# Patient Record
Sex: Female | Born: 1975
Health system: Southern US, Community
[De-identification: ages and names within clinical notes are randomized; demographics above are authoritative.]

## PROBLEM LIST (undated history)

## (undated) DIAGNOSIS — Z5189 Encounter for other specified aftercare: Secondary | ICD-10-CM

## (undated) DIAGNOSIS — G709 Myoneural disorder, unspecified: Secondary | ICD-10-CM

## (undated) DIAGNOSIS — Z86018 Personal history of other benign neoplasm: Secondary | ICD-10-CM

## (undated) DIAGNOSIS — F329 Major depressive disorder, single episode, unspecified: Secondary | ICD-10-CM

## (undated) DIAGNOSIS — H547 Unspecified visual loss: Secondary | ICD-10-CM

## (undated) DIAGNOSIS — Z8719 Personal history of other diseases of the digestive system: Secondary | ICD-10-CM

## (undated) DIAGNOSIS — G932 Benign intracranial hypertension: Secondary | ICD-10-CM

## (undated) DIAGNOSIS — F32A Depression, unspecified: Secondary | ICD-10-CM

## (undated) DIAGNOSIS — N63 Unspecified lump in unspecified breast: Secondary | ICD-10-CM

## (undated) DIAGNOSIS — D649 Anemia, unspecified: Secondary | ICD-10-CM

## (undated) HISTORY — PX: APPENDECTOMY: SHX54

## (undated) HISTORY — DX: Personal history of other benign neoplasm: Z86.018

## (undated) HISTORY — DX: Unspecified lump in unspecified breast: N63.0

## (undated) HISTORY — DX: Anemia, unspecified: D64.9

## (undated) HISTORY — PX: MYOMECTOMY: SHX85

## (undated) HISTORY — DX: Unspecified visual loss: H54.7

## (undated) HISTORY — PX: CHOLECYSTECTOMY: SHX55

## (undated) HISTORY — PX: BREAST EXCISIONAL BIOPSY: SUR124

---
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
## 1998-08-28 ENCOUNTER — Other Ambulatory Visit: Admission: RE | Admit: 1998-08-28 | Discharge: 1998-08-28 | Payer: Self-pay | Admitting: Obstetrics and Gynecology

## 1999-10-18 ENCOUNTER — Other Ambulatory Visit: Admission: RE | Admit: 1999-10-18 | Discharge: 1999-10-18 | Payer: Self-pay | Admitting: Obstetrics and Gynecology

## 2000-06-05 ENCOUNTER — Ambulatory Visit (HOSPITAL_COMMUNITY): Admission: RE | Admit: 2000-06-05 | Discharge: 2000-06-05 | Payer: Self-pay | Admitting: Neurosurgery

## 2000-06-05 ENCOUNTER — Encounter: Payer: Self-pay | Admitting: Neurosurgery

## 2001-03-02 ENCOUNTER — Other Ambulatory Visit: Admission: RE | Admit: 2001-03-02 | Discharge: 2001-03-02 | Payer: Self-pay | Admitting: Obstetrics and Gynecology

## 2001-08-25 ENCOUNTER — Encounter: Admission: RE | Admit: 2001-08-25 | Discharge: 2001-11-23 | Payer: Self-pay | Admitting: Emergency Medicine

## 2002-08-23 ENCOUNTER — Other Ambulatory Visit: Admission: RE | Admit: 2002-08-23 | Discharge: 2002-08-23 | Payer: Self-pay | Admitting: Obstetrics and Gynecology

## 2002-09-22 ENCOUNTER — Encounter: Admission: RE | Admit: 2002-09-22 | Discharge: 2002-12-21 | Payer: Self-pay | Admitting: Obstetrics and Gynecology

## 2004-07-06 ENCOUNTER — Other Ambulatory Visit: Admission: RE | Admit: 2004-07-06 | Discharge: 2004-07-06 | Payer: Self-pay | Admitting: Obstetrics and Gynecology

## 2004-11-29 ENCOUNTER — Other Ambulatory Visit: Admission: RE | Admit: 2004-11-29 | Discharge: 2004-11-29 | Payer: Self-pay | Admitting: Obstetrics and Gynecology

## 2005-01-03 ENCOUNTER — Encounter: Admission: RE | Admit: 2005-01-03 | Discharge: 2005-01-03 | Payer: Self-pay | Admitting: Emergency Medicine

## 2005-01-09 ENCOUNTER — Encounter: Admission: RE | Admit: 2005-01-09 | Discharge: 2005-01-09 | Payer: Self-pay | Admitting: Emergency Medicine

## 2005-01-13 ENCOUNTER — Ambulatory Visit (HOSPITAL_BASED_OUTPATIENT_CLINIC_OR_DEPARTMENT_OTHER): Admission: RE | Admit: 2005-01-13 | Discharge: 2005-01-13 | Payer: Self-pay | Admitting: Emergency Medicine

## 2005-01-20 ENCOUNTER — Ambulatory Visit: Payer: Self-pay | Admitting: Internal Medicine

## 2005-04-29 HISTORY — PX: OTHER SURGICAL HISTORY: SHX169

## 2005-06-10 ENCOUNTER — Other Ambulatory Visit: Admission: RE | Admit: 2005-06-10 | Discharge: 2005-06-10 | Payer: Self-pay | Admitting: Obstetrics and Gynecology

## 2006-08-14 ENCOUNTER — Ambulatory Visit: Payer: Self-pay | Admitting: Internal Medicine

## 2006-08-26 ENCOUNTER — Ambulatory Visit: Payer: Self-pay | Admitting: Internal Medicine

## 2006-08-26 LAB — CONVERTED CEMR LAB
ALT: 36 units/L (ref 0–40)
AST: 41 units/L — ABNORMAL HIGH (ref 0–37)
Albumin: 4 g/dL (ref 3.5–5.2)
Alkaline Phosphatase: 69 units/L (ref 39–117)
BUN: 11 mg/dL (ref 6–23)
Basophils Absolute: 0 10*3/uL (ref 0.0–0.1)
Basophils Relative: 0.2 % (ref 0.0–1.0)
Bilirubin, Direct: 0.2 mg/dL (ref 0.0–0.3)
CO2: 28 meq/L (ref 19–32)
Calcium, Total (PTH): 9.1 mg/dL (ref 8.4–10.5)
Calcium: 9 mg/dL (ref 8.4–10.5)
Chloride: 105 meq/L (ref 96–112)
Cholesterol: 124 mg/dL (ref 0–200)
Creatinine, Ser: 0.6 mg/dL (ref 0.4–1.2)
Eosinophils Absolute: 0.1 10*3/uL (ref 0.0–0.6)
Eosinophils Relative: 0.7 % (ref 0.0–5.0)
Ferritin: 7.9 ng/mL — ABNORMAL LOW (ref 10.0–291.0)
Folate: 17 ng/mL
GFR calc Af Amer: 151 mL/min
GFR calc non Af Amer: 125 mL/min
Glucose, Bld: 92 mg/dL (ref 70–99)
HCT: 31.1 % — ABNORMAL LOW (ref 36.0–46.0)
HDL: 52.1 mg/dL (ref >39.0)
Hemoglobin: 10.3 g/dL — ABNORMAL LOW (ref 12.0–15.0)
Hgb A1c MFr Bld: 3.8 % — ABNORMAL LOW (ref 4.6–6.0)
INR: 1.1 (ref 0.9–2.0)
Iron: 27 ug/dL — ABNORMAL LOW (ref 42–145)
LDL Cholesterol: 63 mg/dL (ref 0–99)
Lymphocytes Relative: 21.2 % (ref 12.0–46.0)
MCHC: 33.1 g/dL (ref 30.0–36.0)
MCV: 89.3 fL (ref 78.0–100.0)
Magnesium: 2.1 mg/dL (ref 1.5–2.5)
Monocytes Absolute: 0.5 10*3/uL (ref 0.2–0.7)
Monocytes Relative: 6 % (ref 3.0–11.0)
Neutro Abs: 5.5 10*3/uL (ref 1.4–7.7)
Neutrophils Relative %: 71.9 % (ref 43.0–77.0)
PTH: 86.9 pg/mL — ABNORMAL HIGH (ref 14.0–72.0)
Platelets: 220 10*3/uL (ref 150–400)
Potassium: 3.8 meq/L (ref 3.5–5.1)
Prothrombin Time: 13 s (ref 10.0–14.0)
RBC: 3.48 M/uL — ABNORMAL LOW (ref 3.87–5.11)
RDW: 15.4 % — ABNORMAL HIGH (ref 11.5–14.6)
Saturation Ratios: 7 % — ABNORMAL LOW (ref 20.0–50.0)
Sodium: 138 meq/L (ref 135–145)
T3 Uptake Ratio: 37.7 % — ABNORMAL HIGH (ref 22.5–37.0)
T4, Total: 6.6 ug/dL (ref 5.0–12.5)
TSH: 0.64 microintl units/mL (ref 0.35–5.50)
Total Bilirubin: 0.7 mg/dL (ref 0.3–1.2)
Total CHOL/HDL Ratio: 2.4
Total Protein: 7.3 g/dL (ref 6.0–8.3)
Transferrin: 275.9 mg/dL (ref >=212.0)
Triglycerides: 43 mg/dL (ref 0–149)
VLDL: 9 mg/dL (ref 0–40)
Vit D, 1,25-Dihydroxy: <7 — ABNORMAL LOW (ref 20–57)
Vitamin B-12: 505 pg/mL (ref 211–911)
WBC: 7.7 10*3/uL (ref 4.5–10.5)

## 2006-10-16 ENCOUNTER — Ambulatory Visit: Payer: Self-pay | Admitting: Internal Medicine

## 2006-10-16 LAB — CONVERTED CEMR LAB
ALT: 11 units/L (ref 0–40)
AST: 15 units/L (ref 0–37)
Albumin: 2.7 g/dL — ABNORMAL LOW (ref 3.5–5.2)
Alkaline Phosphatase: 67 units/L (ref 39–117)
BUN: 11 mg/dL (ref 6–23)
Bacteria, UA: NEGATIVE
Basophils Absolute: 0 10*3/uL (ref 0.0–0.1)
Basophils Relative: 0.3 % (ref 0.0–1.0)
Bilirubin, Direct: 0.1 mg/dL (ref 0.0–0.3)
CO2: 29 meq/L (ref 19–32)
Calcium: 8.2 mg/dL — ABNORMAL LOW (ref 8.4–10.5)
Chloride: 107 meq/L (ref 96–112)
Creatinine, Ser: 0.7 mg/dL (ref 0.4–1.2)
Crystals: NEGATIVE
Eosinophils Absolute: 0.1 10*3/uL (ref 0.0–0.6)
Eosinophils Relative: 0.9 % (ref 0.0–5.0)
GFR calc Af Amer: 126 mL/min
GFR calc non Af Amer: 104 mL/min
Glucose, Bld: 85 mg/dL (ref 70–99)
HCT: 28.1 % — ABNORMAL LOW (ref 36.0–46.0)
Hemoglobin: 9.1 g/dL — ABNORMAL LOW (ref 12.0–15.0)
Lymphocytes Relative: 14.6 % (ref 12.0–46.0)
MCHC: 32.3 g/dL (ref 30.0–36.0)
MCV: 83.2 fL (ref 78.0–100.0)
Monocytes Absolute: 1.1 10*3/uL — ABNORMAL HIGH (ref 0.2–0.7)
Monocytes Relative: 10.7 % (ref 3.0–11.0)
Neutro Abs: 7.5 10*3/uL (ref 1.4–7.7)
Neutrophils Relative %: 73.5 % (ref 43.0–77.0)
Nitrite: NEGATIVE
Platelets: 463 10*3/uL — ABNORMAL HIGH (ref 150–400)
Potassium: 4.5 meq/L (ref 3.5–5.1)
RBC: 3.38 M/uL — ABNORMAL LOW (ref 3.87–5.11)
RDW: 14.9 % — ABNORMAL HIGH (ref 11.5–14.6)
Sodium: 137 meq/L (ref 135–145)
Specific Gravity, Urine: 1.025 (ref 1.000–1.03)
Total Bilirubin: 0.4 mg/dL (ref 0.3–1.2)
Total Protein, Urine: 30 mg/dL — AB
Total Protein: 8.5 g/dL — ABNORMAL HIGH (ref 6.0–8.3)
Urine Glucose: NEGATIVE mg/dL
Urobilinogen, UA: 1 (ref 0.0–1.0)
WBC: 10.2 10*3/uL (ref 4.5–10.5)
hCG, Beta Chain, Quant, S: <0.5 milliintl units/mL
pH: 6 (ref 5.0–8.0)

## 2007-01-09 ENCOUNTER — Ambulatory Visit: Payer: Self-pay | Admitting: Internal Medicine

## 2007-01-09 LAB — CONVERTED CEMR LAB
ALT: 27 units/L (ref 0–35)
AST: 35 units/L (ref 0–37)
Albumin: 3.2 g/dL — ABNORMAL LOW (ref 3.5–5.2)
Alkaline Phosphatase: 67 units/L (ref 39–117)
BUN: 11 mg/dL (ref 6–23)
Basophils Absolute: 0 10*3/uL (ref 0.0–0.1)
Basophils Relative: 0.5 % (ref 0.0–1.0)
Bilirubin, Direct: 0.1 mg/dL (ref 0.0–0.3)
CO2: 26 meq/L (ref 19–32)
Calcium: 9 mg/dL (ref 8.4–10.5)
Chloride: 112 meq/L (ref 96–112)
Creatinine, Ser: 0.9 mg/dL (ref 0.4–1.2)
Eosinophils Absolute: 0.1 10*3/uL (ref 0.0–0.6)
Eosinophils Relative: 0.7 % (ref 0.0–5.0)
Folate: 12 ng/mL
GFR calc Af Amer: 95 mL/min
GFR calc non Af Amer: 78 mL/min
Glucose, Bld: 84 mg/dL (ref 70–99)
HCT: 29.5 % — ABNORMAL LOW (ref 36.0–46.0)
Hemoglobin: 9.2 g/dL — ABNORMAL LOW (ref 12.0–15.0)
Iron: 26 ug/dL — ABNORMAL LOW (ref 42–145)
Lymphocytes Relative: 36.2 % (ref 12.0–46.0)
MCHC: 31.2 g/dL (ref 30.0–36.0)
MCV: 80.2 fL (ref 78.0–100.0)
Monocytes Absolute: 0.5 10*3/uL (ref 0.2–0.7)
Monocytes Relative: 7.5 % (ref 3.0–11.0)
Neutro Abs: 4 10*3/uL (ref 1.4–7.7)
Neutrophils Relative %: 55.1 % (ref 43.0–77.0)
Platelets: 219 10*3/uL (ref 150–400)
Potassium: 3.9 meq/L (ref 3.5–5.1)
RBC: 3.67 M/uL — ABNORMAL LOW (ref 3.87–5.11)
RDW: 16.1 % — ABNORMAL HIGH (ref 11.5–14.6)
Saturation Ratios: 6.5 % — ABNORMAL LOW (ref 20.0–50.0)
Sodium: 142 meq/L (ref 135–145)
Total Bilirubin: 0.5 mg/dL (ref 0.3–1.2)
Total Protein: 9.3 g/dL — ABNORMAL HIGH (ref 6.0–8.3)
Transferrin: 287.1 mg/dL (ref >=212.0)
WBC: 7.2 10*3/uL (ref 4.5–10.5)

## 2007-01-14 ENCOUNTER — Ambulatory Visit: Payer: Self-pay | Admitting: Internal Medicine

## 2007-01-19 ENCOUNTER — Ambulatory Visit: Payer: Self-pay | Admitting: Gastroenterology

## 2007-01-19 LAB — CONVERTED CEMR LAB
CA 125: 27.2 units/mL (ref 0.0–30.2)
CEA: 1.9 ng/mL (ref 0.0–5.0)
INR: 1.3 — ABNORMAL HIGH (ref 0.8–1.0)
Prothrombin Time: 14.1 s — ABNORMAL HIGH (ref 10.9–13.3)
aPTT: 34.8 s — ABNORMAL HIGH (ref 21.7–29.8)

## 2007-01-22 ENCOUNTER — Ambulatory Visit (HOSPITAL_COMMUNITY): Admission: RE | Admit: 2007-01-22 | Discharge: 2007-01-22 | Payer: Self-pay | Admitting: Gastroenterology

## 2007-01-22 ENCOUNTER — Encounter (INDEPENDENT_AMBULATORY_CARE_PROVIDER_SITE_OTHER): Payer: Self-pay | Admitting: Interventional Radiology

## 2007-02-02 ENCOUNTER — Ambulatory Visit: Payer: Self-pay | Admitting: Gastroenterology

## 2007-02-04 ENCOUNTER — Ambulatory Visit (HOSPITAL_COMMUNITY): Admission: RE | Admit: 2007-02-04 | Discharge: 2007-02-04 | Payer: Self-pay | Admitting: Gastroenterology

## 2007-02-10 ENCOUNTER — Ambulatory Visit: Payer: Self-pay | Admitting: Internal Medicine

## 2007-02-18 ENCOUNTER — Ambulatory Visit (HOSPITAL_COMMUNITY): Admission: RE | Admit: 2007-02-18 | Discharge: 2007-02-18 | Payer: Self-pay | Admitting: Internal Medicine

## 2007-03-05 ENCOUNTER — Encounter: Payer: Self-pay | Admitting: Internal Medicine

## 2007-03-06 ENCOUNTER — Encounter: Payer: Self-pay | Admitting: Internal Medicine

## 2007-03-12 ENCOUNTER — Encounter: Payer: Self-pay | Admitting: Internal Medicine

## 2007-03-30 ENCOUNTER — Encounter: Payer: Self-pay | Admitting: Internal Medicine

## 2007-03-31 DIAGNOSIS — F329 Major depressive disorder, single episode, unspecified: Secondary | ICD-10-CM | POA: Insufficient documentation

## 2007-03-31 DIAGNOSIS — F3289 Other specified depressive episodes: Secondary | ICD-10-CM | POA: Insufficient documentation

## 2007-05-01 ENCOUNTER — Encounter: Payer: Self-pay | Admitting: Internal Medicine

## 2007-05-06 LAB — CONVERTED CEMR LAB

## 2007-05-11 ENCOUNTER — Encounter: Payer: Self-pay | Admitting: Internal Medicine

## 2007-06-16 ENCOUNTER — Ambulatory Visit: Payer: Self-pay | Admitting: Internal Medicine

## 2007-06-16 DIAGNOSIS — Z9884 Bariatric surgery status: Secondary | ICD-10-CM | POA: Insufficient documentation

## 2007-06-16 DIAGNOSIS — H6980 Other specified disorders of Eustachian tube, unspecified ear: Secondary | ICD-10-CM | POA: Insufficient documentation

## 2007-06-18 ENCOUNTER — Encounter: Payer: Self-pay | Admitting: Internal Medicine

## 2007-07-07 DIAGNOSIS — K439 Ventral hernia without obstruction or gangrene: Secondary | ICD-10-CM | POA: Insufficient documentation

## 2007-07-07 DIAGNOSIS — Z87898 Personal history of other specified conditions: Secondary | ICD-10-CM | POA: Insufficient documentation

## 2007-07-07 DIAGNOSIS — H548 Legal blindness, as defined in USA: Secondary | ICD-10-CM | POA: Insufficient documentation

## 2007-07-07 DIAGNOSIS — G932 Benign intracranial hypertension: Secondary | ICD-10-CM | POA: Insufficient documentation

## 2007-07-07 DIAGNOSIS — E119 Type 2 diabetes mellitus without complications: Secondary | ICD-10-CM | POA: Insufficient documentation

## 2007-07-07 DIAGNOSIS — Z8669 Personal history of other diseases of the nervous system and sense organs: Secondary | ICD-10-CM | POA: Insufficient documentation

## 2007-07-07 DIAGNOSIS — R188 Other ascites: Secondary | ICD-10-CM | POA: Insufficient documentation

## 2007-07-16 ENCOUNTER — Ambulatory Visit: Payer: Self-pay | Admitting: Internal Medicine

## 2007-07-16 DIAGNOSIS — R51 Headache: Secondary | ICD-10-CM | POA: Insufficient documentation

## 2007-07-16 DIAGNOSIS — R519 Headache, unspecified: Secondary | ICD-10-CM | POA: Insufficient documentation

## 2007-07-21 ENCOUNTER — Ambulatory Visit: Payer: Self-pay | Admitting: Cardiovascular Disease

## 2007-09-24 ENCOUNTER — Inpatient Hospital Stay (HOSPITAL_COMMUNITY): Admission: AD | Admit: 2007-09-24 | Discharge: 2007-09-24 | Payer: Self-pay | Admitting: Obstetrics and Gynecology

## 2007-10-01 ENCOUNTER — Ambulatory Visit: Payer: Self-pay | Admitting: Internal Medicine

## 2007-10-01 ENCOUNTER — Encounter: Payer: Self-pay | Admitting: Internal Medicine

## 2007-10-01 LAB — CONVERTED CEMR LAB
Calcium, Total (PTH): 9 mg/dL (ref 8.4–10.5)
INR: 1.2 — ABNORMAL HIGH (ref 0.8–1.0)
PTH: 60.6 pg/mL (ref 14.0–72.0)
Prothrombin Time: 13.9 s — ABNORMAL HIGH (ref 10.9–13.3)
Vit D, 1,25-Dihydroxy: 9 — ABNORMAL LOW (ref 30–89)

## 2007-10-02 ENCOUNTER — Encounter: Payer: Self-pay | Admitting: Internal Medicine

## 2008-02-25 ENCOUNTER — Encounter: Admission: RE | Admit: 2008-02-25 | Discharge: 2008-02-25 | Payer: Self-pay | Admitting: Obstetrics and Gynecology

## 2008-04-28 IMAGING — US US PARACENTESIS
1 series · 4 of 4 positions shown · non-contrast
Comparison: none

CLINICAL DATA: 31-year-old with history of pseudotumor cerebri with a lumbar peritoneal shunt.  Patient has recurrent ascites.  
 ULTRASOUND-GUIDED THERAPEUTIC PARACENTESIS:
 An ultrasound-guided paracentesis was thoroughly discussed with the patient and questions answered. The benefits, risks, alternatives, and complications were also discussed. The patient understands and wishes to proceed with the procedure. A verbal as well as written consent was obtained.
 Ultrasound was performed to localize and mark an adequate pocket of fluid in the right abdomen. The area was then prepped and draped in the normal sterile fashion. 1% Lidocaine was used for local anesthesia. A 19-gauge Yueh catheter was introduced yielding approximately 7.2 liters of clear yellow fluid. The patient tolerated the procedure well, and there were no immediate complications.

[Series 1: paracentesis · 0.37mm/px · 4 of 4 slices shown]
[im 1/4]
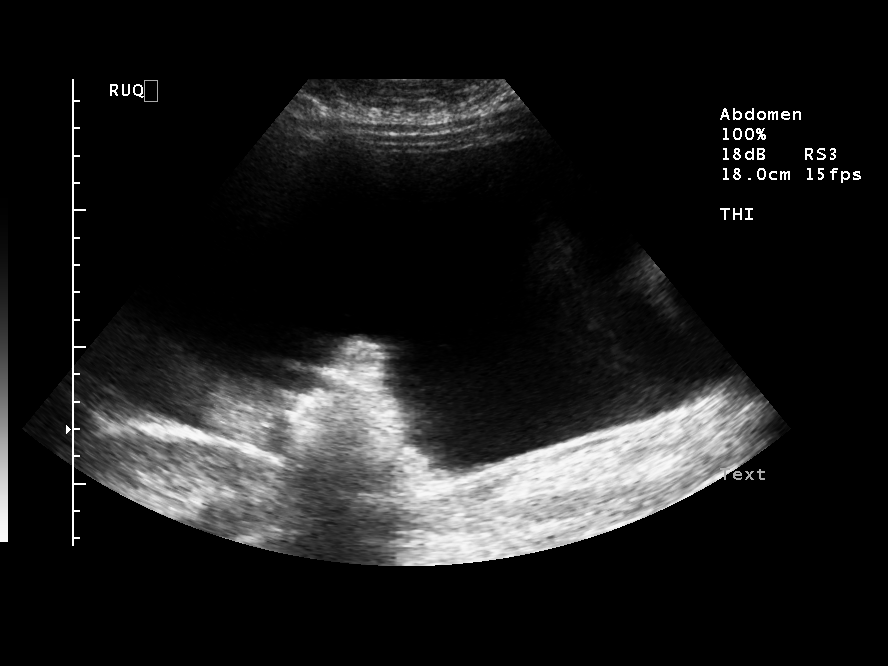
[im 2/4]
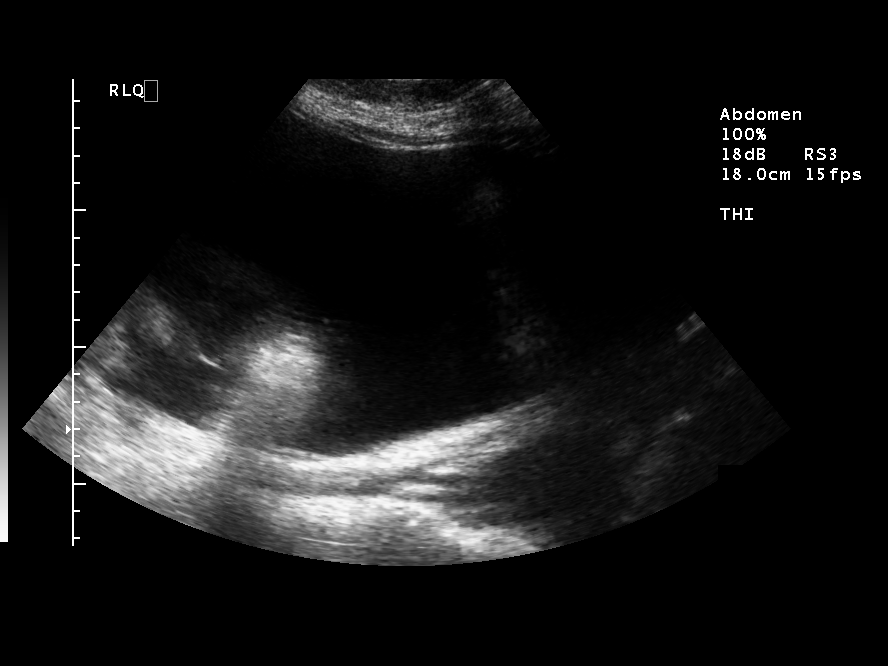
[im 3/4]
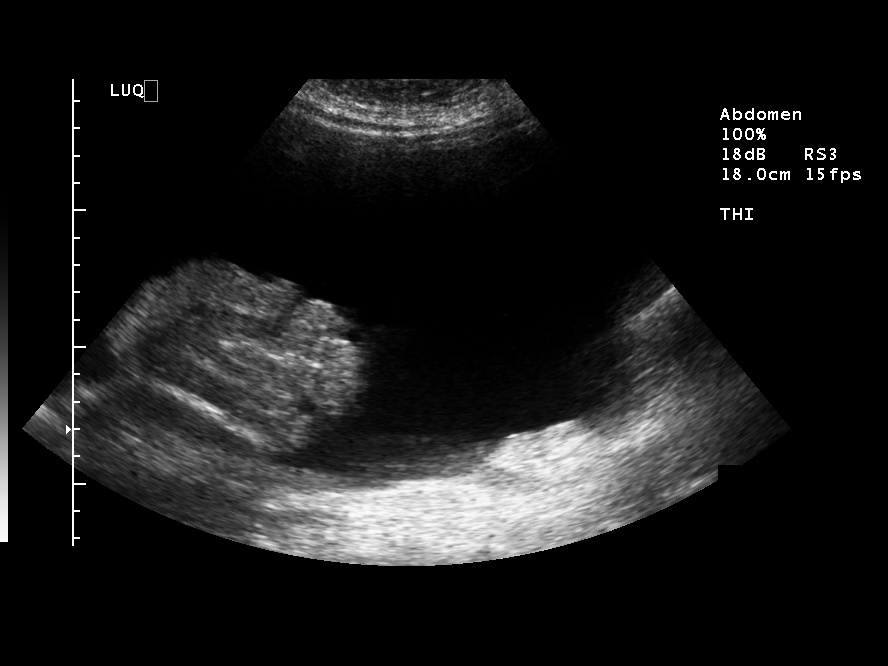
[im 4/4]
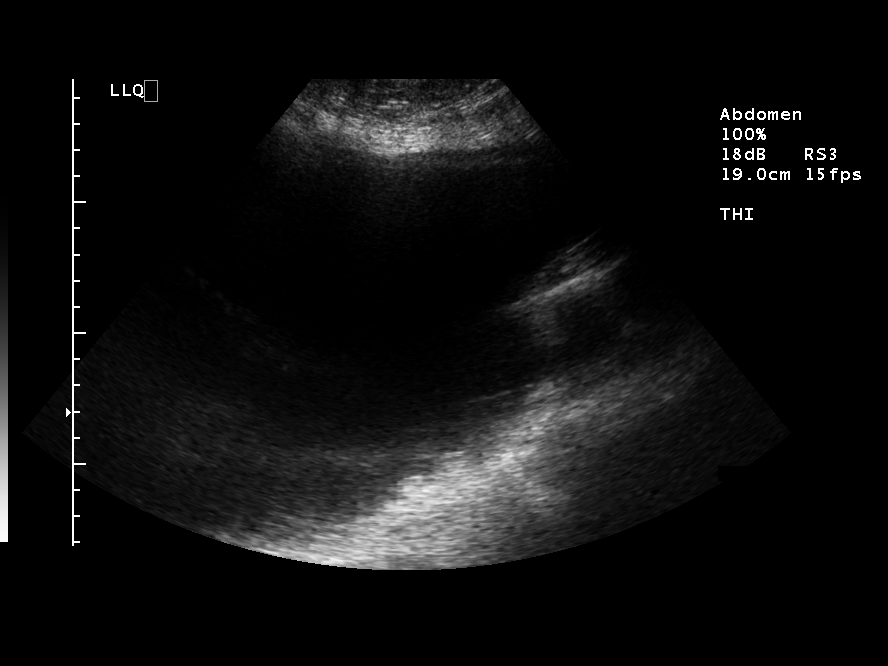

[4 of 4 positions shown; findings below may reference images not displayed]

IMPRESSION: Successful ultrasound-guided paracentesis yielding 7.2 liters of clear yellow fluid.

## 2008-06-14 ENCOUNTER — Ambulatory Visit: Payer: Self-pay | Admitting: Hematology and Oncology

## 2008-06-17 LAB — CBC WITH DIFFERENTIAL/PLATELET
BASO%: 0.5 % (ref 0.0–2.0)
Basophils Absolute: 0 10*3/uL (ref 0.0–0.1)
EOS%: 2.5 % (ref 0.0–7.0)
Eosinophils Absolute: 0.2 10*3/uL (ref 0.0–0.5)
HCT: 30 % — ABNORMAL LOW (ref 34.8–46.6)
HGB: 8.8 g/dL — ABNORMAL LOW (ref 11.6–15.9)
LYMPH%: 18.8 % (ref 14.0–49.7)
MCH: 22.5 pg — ABNORMAL LOW (ref 25.1–34.0)
MCHC: 29.3 g/dL — ABNORMAL LOW (ref 31.5–36.0)
MCV: 76.7 fL — ABNORMAL LOW (ref 79.5–101.0)
MONO#: 0.6 10*3/uL (ref 0.1–0.9)
MONO%: 7.6 % (ref 0.0–14.0)
NEUT#: 5.7 10*3/uL (ref 1.5–6.5)
NEUT%: 70.6 % (ref 38.4–76.8)
Platelets: 272 10*3/uL (ref 145–400)
RBC: 3.91 10*6/uL (ref 3.70–5.45)
RDW: 19.1 % — ABNORMAL HIGH (ref 11.2–14.5)
WBC: 8.1 10*3/uL (ref 3.9–10.3)
lymph#: 1.5 10*3/uL (ref 0.9–3.3)

## 2008-06-21 LAB — VITAMIN B12: Vitamin B-12: 811 pg/mL (ref 211–911)

## 2008-06-21 LAB — COMPREHENSIVE METABOLIC PANEL
ALT: 14 U/L (ref 0–35)
AST: 18 U/L (ref 0–37)
Albumin: 4 g/dL (ref 3.5–5.2)
Alkaline Phosphatase: 75 U/L (ref 39–117)
BUN: 12 mg/dL (ref 6–23)
CO2: 25 mEq/L (ref 19–32)
Calcium: 8.8 mg/dL (ref 8.4–10.5)
Chloride: 107 mEq/L (ref 96–112)
Creatinine, Ser: 0.64 mg/dL (ref 0.40–1.20)
Glucose, Bld: 82 mg/dL (ref 70–99)
Potassium: 4.3 mEq/L (ref 3.5–5.3)
Sodium: 137 mEq/L (ref 135–145)
Total Bilirubin: 0.3 mg/dL (ref 0.3–1.2)
Total Protein: 7.5 g/dL (ref 6.0–8.3)

## 2008-06-21 LAB — IRON AND TIBC
Iron: <10 ug/dL — ABNORMAL LOW (ref 42–145)
UIBC: 407 ug/dL

## 2008-06-21 LAB — PROTEIN ELECTROPHORESIS, SERUM, WITH REFLEX
Albumin ELP: 51.5 % — ABNORMAL LOW (ref 55.8–66.1)
Alpha-1-Globulin: 4.2 % (ref 2.9–4.9)
Alpha-2-Globulin: 8.6 % (ref 7.1–11.8)
Beta 2: 3.1 % — ABNORMAL LOW (ref 3.2–6.5)
Beta Globulin: 6.5 % (ref 4.7–7.2)
Gamma Globulin: 26.1 % — ABNORMAL HIGH (ref 11.1–18.8)
Total Protein, Serum Electrophoresis: 7.5 g/dL (ref 6.0–8.3)

## 2008-06-21 LAB — HAPTOGLOBIN: Haptoglobin: 85 mg/dL (ref 16–200)

## 2008-06-21 LAB — FERRITIN: Ferritin: 4 ng/mL — ABNORMAL LOW (ref 10–291)

## 2008-06-21 LAB — DIRECT ANTIGLOBULIN TEST (NOT AT ARMC)
DAT (Complement): NEGATIVE
DAT IgG: NEGATIVE

## 2008-06-21 LAB — ERYTHROPOIETIN: Erythropoietin: 118 m[IU]/mL — ABNORMAL HIGH (ref 2.6–34.0)

## 2008-07-26 ENCOUNTER — Ambulatory Visit: Payer: Self-pay | Admitting: Hematology and Oncology

## 2008-08-02 LAB — CBC WITH DIFFERENTIAL/PLATELET
BASO%: 0.5 % (ref 0.0–2.0)
Basophils Absolute: 0 10*3/uL (ref 0.0–0.1)
EOS%: 0.9 % (ref 0.0–7.0)
Eosinophils Absolute: 0.1 10*3/uL (ref 0.0–0.5)
HCT: 35.5 % (ref 34.8–46.6)
HGB: 11.9 g/dL (ref 11.6–15.9)
LYMPH%: 12.8 % — ABNORMAL LOW (ref 14.0–49.7)
MCH: 29.4 pg (ref 25.1–34.0)
MCHC: 33.5 g/dL (ref 31.5–36.0)
MCV: 88 fL (ref 79.5–101.0)
MONO#: 0.5 10*3/uL (ref 0.1–0.9)
MONO%: 5 % (ref 0.0–14.0)
NEUT#: 8.6 10*3/uL — ABNORMAL HIGH (ref 1.5–6.5)
NEUT%: 80.8 % — ABNORMAL HIGH (ref 38.4–76.8)
Platelets: 153 10*3/uL (ref 145–400)
RBC: 4.03 10*6/uL (ref 3.70–5.45)
RDW: 25.8 % — ABNORMAL HIGH (ref 11.2–14.5)
WBC: 10.6 10*3/uL — ABNORMAL HIGH (ref 3.9–10.3)
lymph#: 1.4 10*3/uL (ref 0.9–3.3)

## 2008-08-02 LAB — IRON AND TIBC
%SAT: 19 % — ABNORMAL LOW (ref 20–55)
Iron: 52 ug/dL (ref 42–145)
TIBC: 277 ug/dL (ref 250–470)
UIBC: 225 ug/dL

## 2008-08-02 LAB — FERRITIN: Ferritin: 118 ng/mL (ref 10–291)

## 2008-12-07 ENCOUNTER — Encounter: Admission: RE | Admit: 2008-12-07 | Discharge: 2008-12-07 | Payer: Self-pay | Admitting: Obstetrics and Gynecology

## 2010-02-09 ENCOUNTER — Emergency Department (HOSPITAL_COMMUNITY): Admission: EM | Admit: 2010-02-09 | Discharge: 2010-02-09 | Payer: Self-pay | Admitting: Emergency Medicine

## 2010-02-15 IMAGING — MG MM DIAGNOSTIC UNILATERAL R
3 series · 3 of 3 positions shown · non-contrast
Comparison: 02/25/2008

CLINICAL DATA: The patient notes possible right breast mass.

DIGITAL DIAGNOSTIC  RIGHT BREAST  MAMMOGRAM  WITH CAD AND RIGHT
BREAST ULTRASOUND:

[R CC]
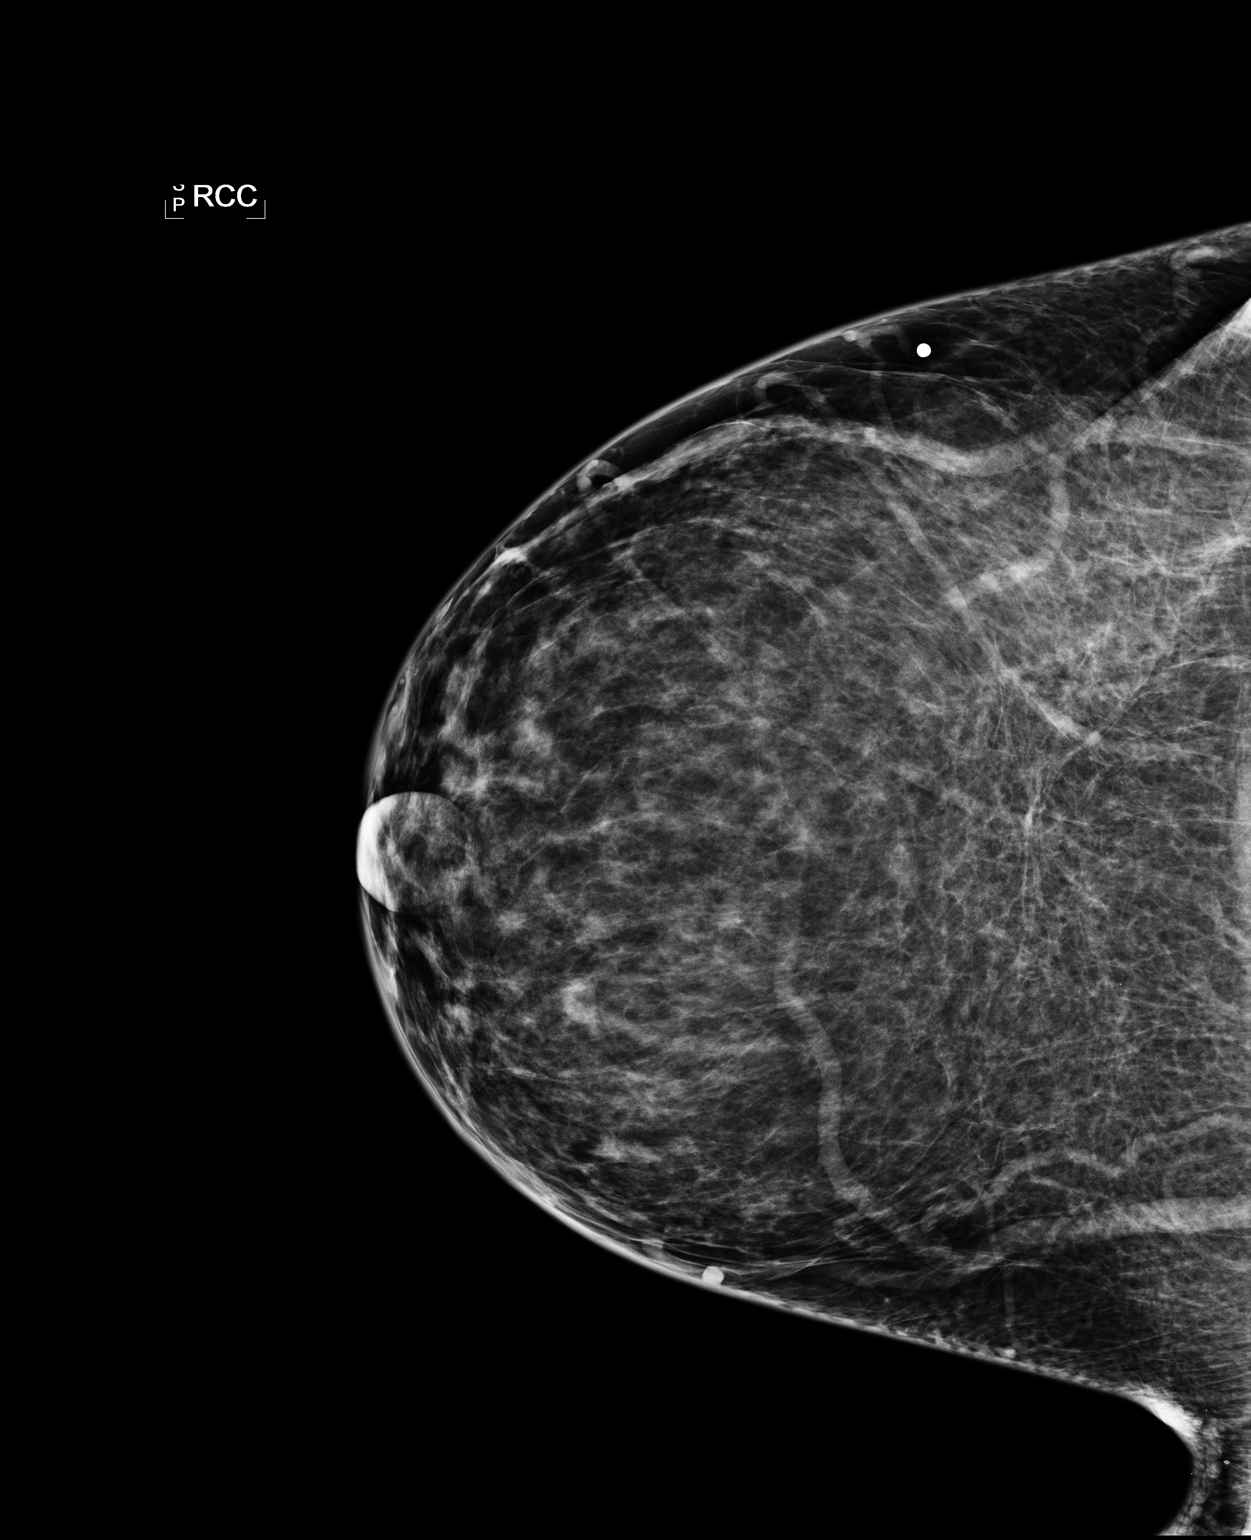

[R MLO]
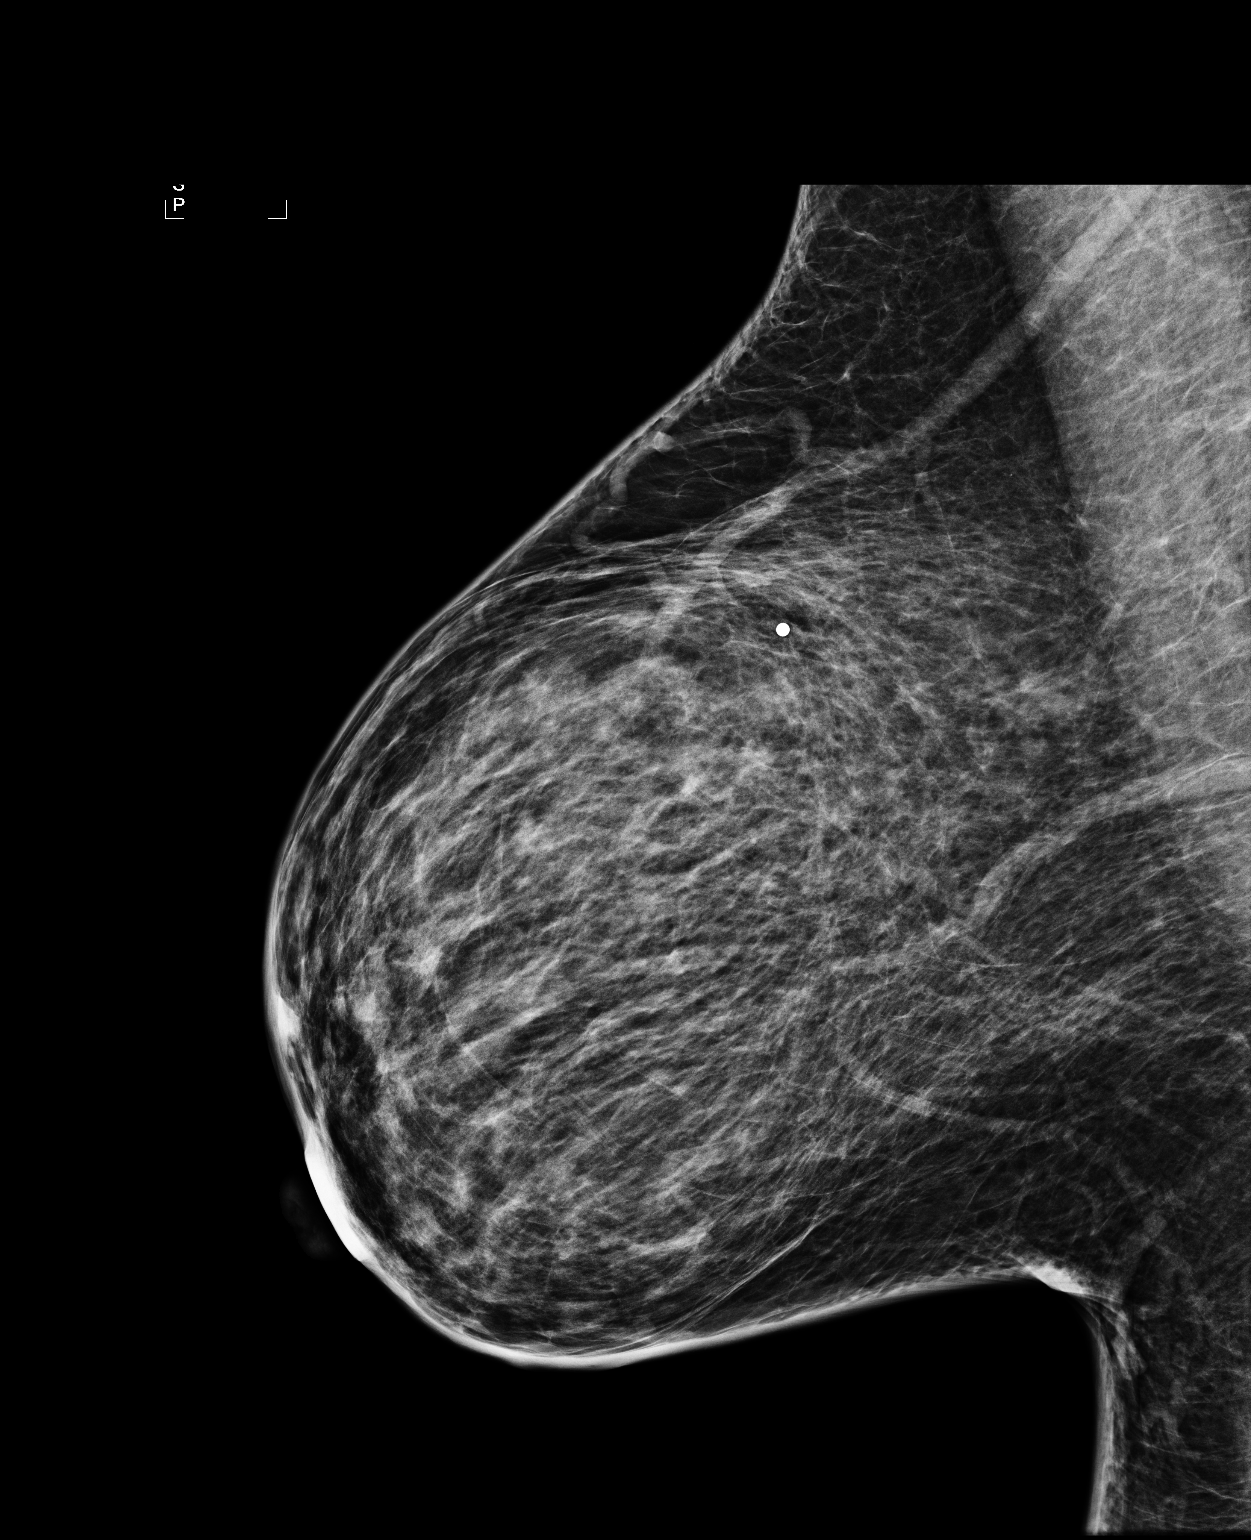

[R TAN]
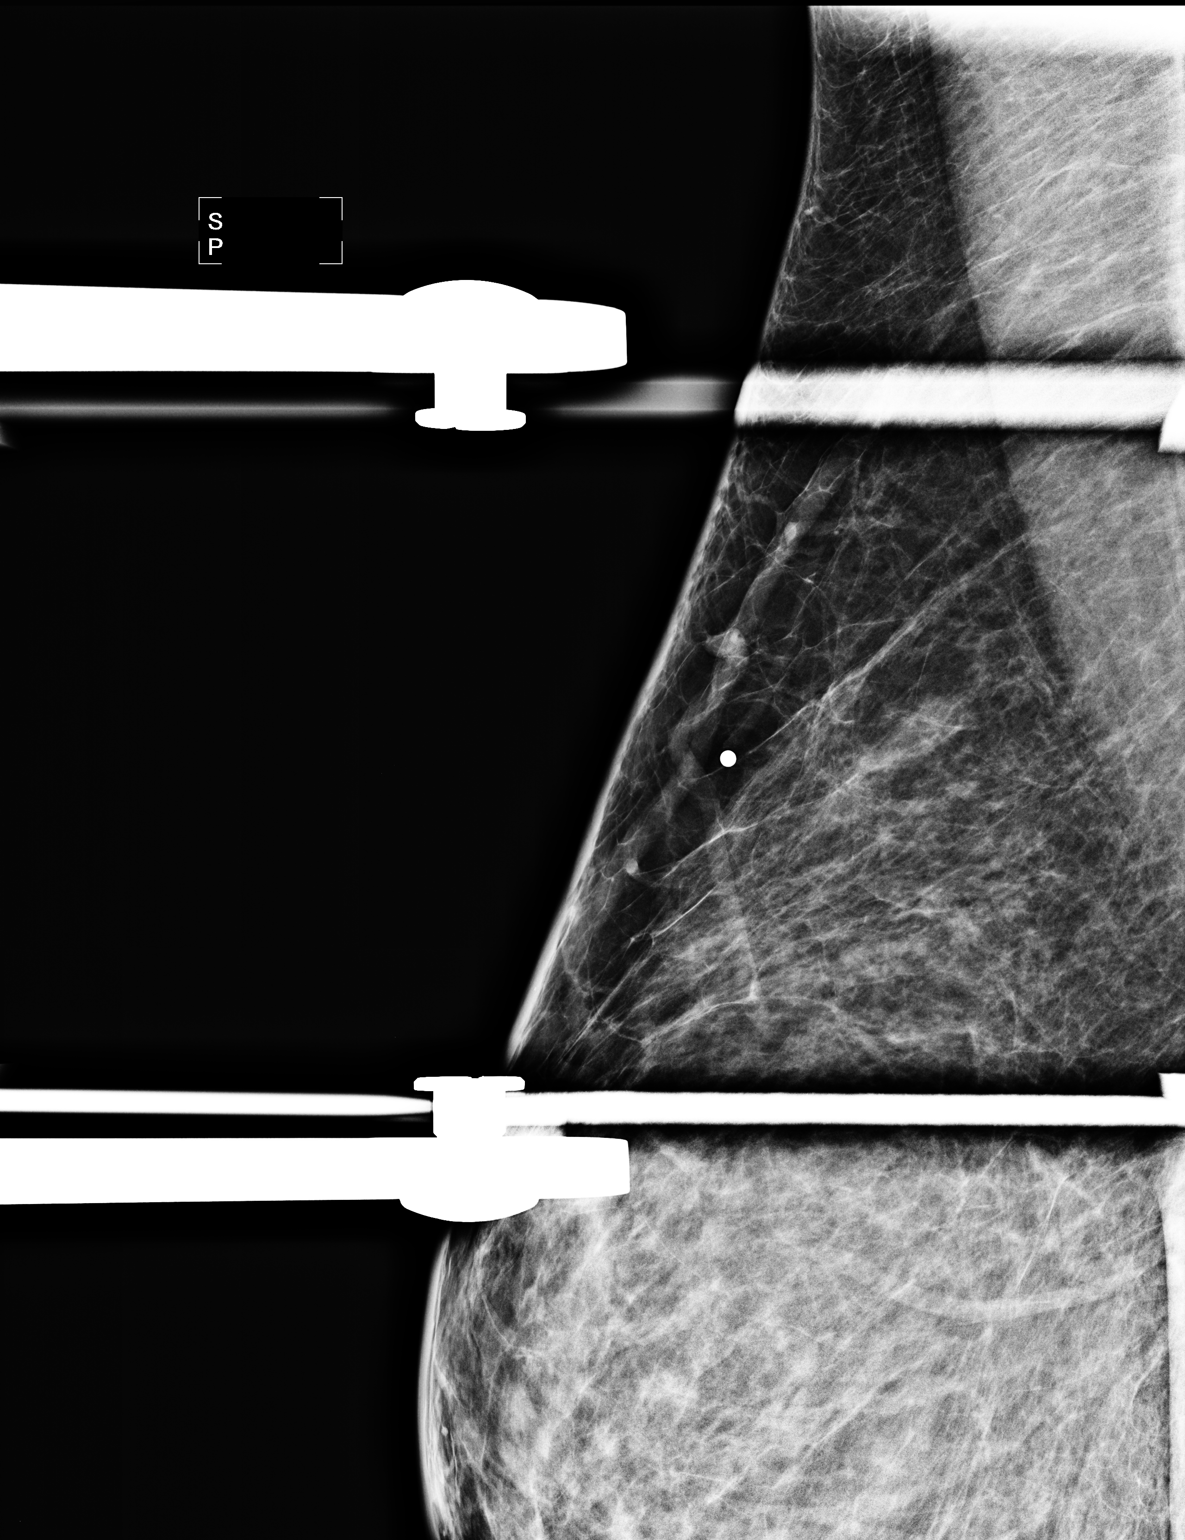

[3 of 3 positions shown; findings below may reference images not displayed]

FINDINGS: There is a scattered fibroglandular breast parenchymal
pattern present which is stable.  There is no mass, distortion, or
worrisome calcification.
Mammographic images were processed with CAD.

On physical exam, there is thickening within the lateral right
breast.  However, there is no discrete palpable mass.

Ultrasound is performed, showing normal-appearing fibroglandular
tissue and fatty tissue within the lateral right breast.  There is
no mass, cyst, distortion, or worrisome shadowing
IMPRESSION: Thickening located laterally within the right breast is consistent
with glandular tissue and fatty tissue.  There is no evidence for
developing malignancy.  Recommend screening mammography at age 41
(or earlier if felt to be clinically indicated).

BI-RADS CATEGORY 1:  Negative.

## 2010-04-29 DIAGNOSIS — IMO0001 Reserved for inherently not codable concepts without codable children: Secondary | ICD-10-CM

## 2010-04-29 DIAGNOSIS — Z5189 Encounter for other specified aftercare: Secondary | ICD-10-CM

## 2010-04-29 HISTORY — DX: Reserved for inherently not codable concepts without codable children: IMO0001

## 2010-04-29 HISTORY — PX: BREAST SURGERY: SHX581

## 2010-04-29 HISTORY — DX: Encounter for other specified aftercare: Z51.89

## 2010-05-07 ENCOUNTER — Encounter
Admission: RE | Admit: 2010-05-07 | Discharge: 2010-05-07 | Payer: Self-pay | Source: Home / Self Care | Attending: Obstetrics and Gynecology | Admitting: Obstetrics and Gynecology

## 2010-05-19 ENCOUNTER — Encounter: Payer: Self-pay | Admitting: Obstetrics and Gynecology

## 2010-05-27 HISTORY — PX: FETAL BLOOD TRANSFUSION: SHX1602

## 2010-05-27 LAB — CONVERTED CEMR LAB
ALT: 23 units/L (ref 0–35)
AST: 25 units/L (ref 0–37)
Albumin: 3.6 g/dL (ref 3.5–5.2)
Alkaline Phosphatase: 74 units/L (ref 39–117)
BUN: 13 mg/dL (ref 6–23)
Basophils Absolute: 0 10*3/uL (ref 0.0–0.1)
Basophils Relative: 0.5 % (ref 0.0–1.0)
Bilirubin, Direct: 0.1 mg/dL (ref 0.0–0.3)
CO2: 23 meq/L (ref 19–32)
Calcium: 8.5 mg/dL (ref 8.4–10.5)
Chloride: 107 meq/L (ref 96–112)
Cholesterol: 115 mg/dL (ref 0–200)
Creatinine, Ser: 0.7 mg/dL (ref 0.4–1.2)
Eosinophils Absolute: 0.2 10*3/uL (ref 0.0–0.7)
Eosinophils Relative: 2.8 % (ref 0.0–5.0)
Ferritin: 6.2 ng/mL — ABNORMAL LOW (ref 10.0–291.0)
Folate: 13 ng/mL
GFR calc Af Amer: 126 mL/min
GFR calc non Af Amer: 104 mL/min
Glucose, Bld: 86 mg/dL (ref 70–99)
HCT: 25.1 % — ABNORMAL LOW (ref 36.0–46.0)
HDL: 50.8 mg/dL (ref >39.0)
Hemoglobin: 8.4 g/dL — ABNORMAL LOW (ref 12.0–15.0)
LDL Cholesterol: 59 mg/dL (ref 0–99)
Lymphocytes Relative: 25.6 % (ref 12.0–46.0)
MCHC: 33.3 g/dL (ref 30.0–36.0)
MCV: 76.4 fL — ABNORMAL LOW (ref 78.0–100.0)
Magnesium: 2.3 mg/dL (ref 1.5–2.5)
Monocytes Absolute: 0.6 10*3/uL (ref 0.1–1.0)
Monocytes Relative: 7.9 % (ref 3.0–12.0)
Neutro Abs: 5.2 10*3/uL (ref 1.4–7.7)
Neutrophils Relative %: 63.2 % (ref 43.0–77.0)
Platelets: 381 10*3/uL (ref 150–400)
Potassium: 4 meq/L (ref 3.5–5.1)
RBC: 3.28 M/uL — ABNORMAL LOW (ref 3.87–5.11)
RDW: 19.7 % — ABNORMAL HIGH (ref 11.5–14.6)
Sodium: 134 meq/L — ABNORMAL LOW (ref 135–145)
T3 Uptake Ratio: 44.9 % — ABNORMAL HIGH (ref 22.5–37.0)
T4, Total: 5.9 ug/dL (ref 5.0–12.5)
TSH: 0.66 microintl units/mL (ref 0.35–5.50)
Total Bilirubin: 0.6 mg/dL (ref 0.3–1.2)
Total CHOL/HDL Ratio: 2.3
Total Protein: 7.9 g/dL (ref 6.0–8.3)
Triglycerides: 26 mg/dL (ref 0–149)
VLDL: 5 mg/dL (ref 0–40)
Vitamin B-12: 483 pg/mL (ref 211–911)
WBC: 8.1 10*3/uL (ref 4.5–10.5)

## 2010-05-29 NOTE — Assessment & Plan Note (Signed)
Summary: FU-$50-CO PAY INFO-STC   Vital Signs:  Patient Profile:   35 Years Old Female Height:     69 inches Weight:      183.25 pounds BMI:     27.16 Temp:     96.9 degrees F oral Pulse rate:   70 / minute BP sitting:   109 / 68  (right arm)  Vitals Entered By: Glendell Docker (June 16, 2007 9:28 AM)                 Chief Complaint:  C/O RIGHT EAR DISCOMFORT  and Ear pain.  History of Present Illness:  Ear Pain      This is a 35 year old woman who presents with Ear pain.  The patient denies ear discharge, sensation of fullness, and tinnitus.  The pain is located in the right ear.  The pain is described as intermittent.  The patient denies vertigo.  She describes "swooshing" noise which is worse in AM.  Her symptoms more noticeable after shunt removed at [facility].  She denies headache.    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  Mother has    a history of hypertension, anxiety/depression, hyperlipidemia.  Father    is 48 and has type 2 diabetes, hypertension and hyperlipidemia.  She has    three brothers who are healthy.  She did not report any family history    of cancer.   Social History:    Single    Never Smoked    Alcohol use-no    She is originally from [facility]   Risk Factors:  Tobacco use:  never Alcohol use:  no   Review of Systems      See HPI   Physical Exam  General:     alert, well-developed, and well-nourished.   Neck:     supple, no masses, and no carotid bruits.   Lungs:     normal respiratory effort and normal breath sounds.   Heart:     normal rate, regular  rhythm, no murmur, and no gallop.   Extremities:     No lower extremity edema     Impression & Recommendations:  Problem # 1:  EUSTACHIAN TUBE DYSFUNCTION, RIGHT (ICD-381.81) Pt compliains of right ear discomfort and "swooshing" noise since she had shunt removed in December, 2008.  Trial of Nasacort and zyrtec OTC.  If no improvement, refer to ENT and consider repeat MRI of brain.   Complete Medication List: 1)  D 1000 1000 Unit Caps (Cholecalciferol) .... Take 1 capsule by mouth twice a day 2)  Tandem 162-115.2 Mg Caps (Ferrous fum-iron polysacch) .... Take 1 capsule by mouth once a day 3)  Multivitamins Tabs (Multiple vitamin) .... Take 1 tablet by mouth once a day 4)  Nasacort Aq 55 Mcg/act Aers (Triamcinolone acetonide(nasal)) .... 2 sprays each nostril once daily 5)  Zyrtec Allergy 10 Mg Tabs (Cetirizine hcl) .... One by mouth qhs     Prescriptions: ZYRTEC ALLERGY 10 MG  TABS (CETIRIZINE HCL) one by mouth qhs  #30 x  5   Entered and Authorized by:   D. Thomos Lemons DO   Signed by:   D. Thomos Lemons DO on 06/16/2007   Method used:   Electronically sent to ...       [facility]  [address]  [phone]*       [address]       [place], [place]  [address]       Ph: [phone] or [phone]       Fax: [phone]   RxID:   [id number] NASACORT AQ 55 MCG/ACT  AERS (TRIAMCINOLONE ACETONIDE(NASAL)) 2 sprays each nostril once daily  #1 x 3   Entered and Authorized by:   D. Thomos Lemons DO   Signed by:   D. Thomos Lemons DO on 06/16/2007   Method used:   Electronically sent to ...       [facility]  [address]  [phone]*       [address]       [place], [place]  [address]       Ph: [phone] or [phone]       Fax: [phone]   RxID:   [id number]  ] Current Allergies (reviewed today): No known allergies

## 2010-05-29 NOTE — Consult Note (Signed)
Summary: [facility]  [facility]   Imported By: Maryln Gottron 04/28/2007 10:50:27  _____________________________________________________________________  External Attachment:    Type:   Image     Comment:   External Document

## 2010-05-29 NOTE — Assessment & Plan Note (Signed)
Summary: PERSISTENT HUMMING NOISE IN EAR/AWARE OF FEE/NML   Vital Signs:  Patient Profile:   35 Years Old Female Height:     69 inches Weight:      184 pounds Temp:     97.1 degrees F oral Pulse rate:   77 / minute BP sitting:   118 / 79  (left arm) Cuff size:   regular  Vitals Entered By: M.Wilkie/SMA                 Chief Complaint:  Headache.  History of Present Illness: 35 year old AA female complains of persistent right sided whooshing noise.  Her symptoms are more noticeable when she leans her head to the right or lays down.   She also reports frequent headaches.  She has taken advil to relieve headaches.     Current Allergies: No known allergies   Past Medical History:    Pseudptumor cerebri complicated by blindness-1994    History of Guillian-Barre with right sided weakness    Depression    History of Morbid Obestiy - S/P Duodunal switch    Abdominal Fluid accumulation secondary to  shunt    LP shunt removed [facility]) 2008      Past Surgical History:    Reviewed history from 03/31/2007 and no changes required:       S/P LP shunt       S/P weight loss surgery/duodenal switch   Family History:    Reviewed history from 06/16/2007 and no changes required:       Mother and father are in their early 60's.  Mother has       a history of hypertension, anxiety/depression, hyperlipidemia.  Father       is 55 and has type 2 diabetes, hypertension and hyperlipidemia.  She has       three brothers who are healthy.  She did not report any family history       of cancer.   Social History:    Reviewed history from 06/16/2007 and no changes required:       Single       Never Smoked       Alcohol use-no       She is originally from [facility]   Risk Factors:  PAP Smear History:     Date of Last PAP Smear:  05/06/2007    Results:  done     Physical Exam  General:     alert, well-developed, and well-nourished.   Head:     normocephalic  and atraumatic.   Ears:     R ear normal and L ear normal.   Neck:     supple and no masses.   Lungs:     normal respiratory effort and normal breath sounds.   Heart:     normal rate, regular rhythm, no murmur, and no gallop.   Abdomen:     soft and non-tender.      Impression & Recommendations:  Problem # 1:  HEADACHE (ICD-784.0) Pt complains of persistent right sideded whooshing noise.  She is also experiencing frequent headaches.  Her symptoms started after VP shunt removed at [facility].  She reports evaluation at [facility] ER 1-2 months ago.  CT of Head reported negative.  Refer to neuro for evaluation. Repeat CT of Head.  If headache becomes severe, I advised she seek emergency care.  Orders: Neurology Referral (Neuro) Radiology Referral (Radiology)   Complete Medication List: 1)  D 1000  1000 Unit Caps (Cholecalciferol) .... Take 1 capsule by mouth twice a day 2)  Tandem 162-115.2 Mg Caps (Ferrous fum-iron polysacch) .... Take 1 capsule by mouth once a day 3)  Multivitamins Tabs (Multiple vitamin) .... Take 1 tablet by mouth once a day 4)  Zicam Cold Remedy Nasal Gel (Homeopathic products) .... As needed     ]  Preventive Care Screening  Pap Smear:    Date:  05/06/2007    Results:  done   Last Tetanus Booster:    Date:  07/09/2000    Results:  given

## 2010-05-29 NOTE — Miscellaneous (Signed)
Summary: Flonase  Clinical Lists Changes  Medications: Changed medication from NASACORT AQ 55 MCG/ACT  AERS (TRIAMCINOLONE ACETONIDE(NASAL)) 2 sprays each nostril once daily to FLONASE 50 MCG/ACT  SUSP (FLUTICASONE PROPIONATE) 2 sprays each nostril once daily - Signed Rx of FLONASE 50 MCG/ACT  SUSP (FLUTICASONE PROPIONATE) 2 sprays each nostril once daily;  #17 x 6;  Signed;  Entered by: Glendell Docker;  Authorized by: D. Thomos Lemons DO;  Method used: Electronic    Prescriptions: FLONASE 50 MCG/ACT  SUSP (FLUTICASONE PROPIONATE) 2 sprays each nostril once daily  #17 x 6   Entered by:   Glendell Docker   Authorized by:   D. Thomos Lemons DO   Signed by:   Glendell Docker on 06/18/2007   Method used:   Electronically sent to ...       [facility]  [address]  [phone]*       [address]       [place], [place]  [address]       Ph: [phone] or [phone]       Fax: [phone]   RxID:   [id number]    Current Allergies: No known allergies

## 2010-05-29 NOTE — Letter (Signed)
Summary: [facility]  [facility]   Imported By: Esmeralda Links D'jimraou 03/17/2007 10:01:23  _____________________________________________________________________  External Attachment:    Type:   Image     Comment:   External Document

## 2010-05-29 NOTE — Letter (Signed)
Summary: [facility]   Imported By: Esmeralda Links D'jimraou 03/24/2007 13:11:04  _____________________________________________________________________  External Attachment:    Type:   Image     Comment:   External Document

## 2010-05-29 NOTE — Consult Note (Signed)
Summary: [facility]  [facility]   Imported By: Maryln Gottron 04/28/2007 10:49:34  _____________________________________________________________________  External Attachment:    Type:   Image     Comment:   External Document

## 2010-05-29 NOTE — Letter (Signed)
Summary: [place] No Show Letter  [facility]  [address] [place], [place] [address]   Phone: [phone]  Fax: [phone]    10/01/2007 MRN: [id number]     Mackenzie [name] [address] [place], [place]  [address]       Dear [name],   Our records indicate that you missed your scheduled appointment with Dr Thomos Lemons on 10/01/07.  Please contact this office to reschedule your appointment as soon as possible.  It is important that you keep your scheduled appointments with your physician, so we can provide you the best care possible.  Please be advised that there may be a charge for "no show" appointments.      Sincerely, [facility]

## 2010-07-12 LAB — DIFFERENTIAL
Basophils Absolute: 0.1 10*3/uL (ref 0.0–0.1)
Basophils Relative: 1 % (ref 0–1)
Eosinophils Absolute: 0 10*3/uL (ref 0.0–0.7)
Eosinophils Relative: 0 % (ref 0–5)
Lymphocytes Relative: 20 % (ref 12–46)
Lymphs Abs: 2 10*3/uL (ref 0.7–4.0)
Monocytes Absolute: 0.6 10*3/uL (ref 0.1–1.0)
Monocytes Relative: 6 % (ref 3–12)
Neutro Abs: 7.6 10*3/uL (ref 1.7–7.7)
Neutrophils Relative %: 74 % (ref 43–77)

## 2010-07-12 LAB — COMPREHENSIVE METABOLIC PANEL
ALT: 69 U/L — ABNORMAL HIGH (ref 0–35)
AST: 166 U/L — ABNORMAL HIGH (ref 0–37)
Albumin: 4 g/dL (ref 3.5–5.2)
Alkaline Phosphatase: 68 U/L (ref 39–117)
BUN: 15 mg/dL (ref 6–23)
CO2: 22 mEq/L (ref 19–32)
Calcium: 8.8 mg/dL (ref 8.4–10.5)
Chloride: 110 mEq/L (ref 96–112)
Creatinine, Ser: 0.82 mg/dL (ref 0.4–1.2)
GFR calc Af Amer: >60 mL/min (ref >60)
GFR calc non Af Amer: >60 mL/min (ref >60)
Glucose, Bld: 118 mg/dL — ABNORMAL HIGH (ref 70–99)
Potassium: 4.6 mEq/L (ref 3.5–5.1)
Sodium: 137 mEq/L (ref 135–145)
Total Bilirubin: 1.3 mg/dL — ABNORMAL HIGH (ref 0.3–1.2)
Total Protein: 7.8 g/dL (ref 6.0–8.3)

## 2010-07-12 LAB — CBC
HCT: 29.2 % — ABNORMAL LOW (ref 36.0–46.0)
Hemoglobin: 9.4 g/dL — ABNORMAL LOW (ref 12.0–15.0)
MCH: 28.2 pg (ref 26.0–34.0)
MCHC: 32.2 g/dL (ref 30.0–36.0)
MCV: 87.6 fL (ref 78.0–100.0)
Platelets: 299 10*3/uL (ref 150–400)
RBC: 3.33 MIL/uL — ABNORMAL LOW (ref 3.87–5.11)
RDW: 15.5 % (ref 11.5–15.5)
WBC: 10.4 10*3/uL (ref 4.0–10.5)

## 2010-07-12 LAB — LIPASE, BLOOD: Lipase: 29 U/L (ref 11–59)

## 2010-09-11 NOTE — Assessment & Plan Note (Signed)
[facility]                         GASTROENTEROLOGY OFFICE NOTE   NAME:Mackenzie [name]                   MRN:          [id number]  DATE:01/19/2007                            DOB:          Dec 11, 1975    NEW PATIENT OFFICE CONSULTATION:   PRIMARY PHYSICIAN:  Barbette Hair. Artist Pais, DO.   PROBLEM:  Ascites.   HISTORY:  Mackenzie [name] is a very pleasant, somewhat complicated African  American female who has a history of pseudotumor cerebri which developed  in 1994 and apparently was attributed to a viral illness, at which time  she also had Guillain-Barre.  She has blindness secondary to this and  also required a ventriculoperitoneal shunt.  She has had the shunt now  over the past 14 years.  Initially this was done in [place], where she  was living at age 76.  She said she had a revision secondary to a  fracture of the shunt in 1997, also done in [place].  She is now  established with Dr. Phoebe Perch for neurosurgery here in [place].  The  patient also has a history of morbid obesity and had attempted to have  bariatric surgery here in [place] but no one would operate on her  due to her history of the shunt, and she went to [place] and had her  surgery done by a doctor Keshishian.  She relates that she had a  duodenal switch procedure done.  We do not have her surgical records at  this time.  This was done in 2007 and she has lost about 100 pounds  since that time.  She also has history of anemia, which has been chronic  but has worsened since her gastric bypass surgery.   The patient says that over the past 3 weeks she has had gradual  development of abdominal distention and swelling and protrusion of a  ventral hernia.  She has never had difficulty with fluid build-up in her  abdomen related to her shunt in the past.  She has gained about 14  pounds over the past month and also complains of low back pain, which  she says may be from the pressure  from her abdomen.  She has not had any  nausea or vomiting, no fever or chills.  Her appetite has been fine.  She did undergo CT scan of the abdomen and pelvis done per primary care,  and this shows fairly massive ascites.  There is evidence of a prior  cholecystectomy and probable surgical staple row along the greater  curvature of the stomach.  The liver appeared normal.  She has patent  portal and splenic veins.  She has intrathecal catheters noted in the  thoracic and lumbar region extending into the peritoneal cavity.  The  catheters are coiled in the pelvis.  She also is noted to have a 3-cm  cyst in the left ovary.  Uterus appeared unremarkable.   Laboratory studies done September 12 shows WBC of 7.2, hemoglobin 9.2,  hematocrit of 29.5, MCV of 80.  Electrolytes within normal limits.  BUN  11, creatinine 0.9.  Liver function studies were  normal.  Albumin 3.2.  Serum iron of 26, transferrin 287, and iron saturation of 6.5.  Folate  level of 12.  UA showed 5-10 wbc's.   CURRENT MEDICATIONS:  1. Caltrate With Vitamin D daily.  2. Multivitamin daily.  3. She is taking an over-the-counter iron supplement.  4. Vitamin D supplement.  5. A Yaz birth control pill daily.  6. She uses MiraLax p.r.n.   ALLERGIES:  No known drug allergies.   Past history as outlined above.  She also has history of diabetes  related to her obesity, has not required any medication since she had  lost weight.  She also has had a prior hernia repair, ventral hernia.   FAMILY HISTORY:  Negative for GI disease.  Father with diabetes.  Mother  and sibs also with diabetes.   SOCIAL HISTORY:  The patient is single.  Lives with her boyfriend.  She  is in college currently.  She is a nonsmoker.  Drinks alcohol socially.   REVIEW OF SYSTEMS:  GI:  As outlined above.  Low back pain as outlined  above.  Last menstrual period December 19, 2006.  Review of systems  otherwise completely negative.   PHYSICAL EXAM:  A  well-developed African American female in no acute  distress.  Height is 5 feet 7 inches, weight is 204.8.  Blood pressure 112/86,  pulse is 88.  HEENT:  Atraumatic, normocephalic.  EOMI.  PERRLA.  Sclerae anicteric.  The patient is blind.  CARDIOVASCULAR:  Regular rate and rhythm with S1 and S2.  No murmur,  rub, or gallop.  PULMONARY:  Clear to A&P.  ABDOMEN:  Large with some evidence of significant weight loss with loose  skin.  She does have ascites, which is non-tense, and a ventral hernia  in the epigastric region.  She is nontender.  There is no palpable mass  or hepatosplenomegaly.  RECTAL:  Not done today.  EXTREMITIES:  Without clubbing, cyanosis, or edema.   IMPRESSION:  78. A 36 year old [place] American female with new onset of ascites of      unclear etiology.  2. Status post ventriculoperitoneal shunt secondary to pseudotumor      cerebri in 1994, from which she also developed blindness.  3. Anemia, iron-deficient.  4. Hypoalbuminemia.  5. Status post bariatric surgery, duodenal switch procedure per      patient.  6. Left ovarian cyst.   PLAN:  1. Check PT/PTT.  Check CEA, CA19-9 and CA-125 level.  2. Schedule transvaginal pelvic ultrasound to further evaluate the      ovarian cyst.  3. Schedule diagnostic and therapeutic paracentesis.  We will ask them      to remove 6-7 L of fluid and send the fluid for routine cell      counts, Gram stain, culture, total protein, amylase, and albumin as      well as cytology.  4. Start Tandem Plus once daily for prescription iron replacement.  5. Have also asked her to sign a release to obtain her surgical      records from her bariatric surgeon.      Mike Gip, PA-C  Electronically Signed      Barbette Hair. Arlyce Dice, MD,FACG  Electronically Signed   AE/MedQ  DD: 01/20/2007  DT: 01/21/2007  Job #: [id number]   cc:   Corwin Levins, MD

## 2010-09-11 NOTE — Assessment & Plan Note (Signed)
[facility]                         GASTROENTEROLOGY OFFICE NOTE   NAME:[name], Mackenzie RIORDAN                   MRN:          [id number]  DATE:02/02/2007                            DOB:          10-27-1975    REVISED DICTATION.   IMPRESSION:  Ascites.  Etiology is unclear. For unknown reasons she does  not seem to be absorbing her ascitic fluid that is produced by her  shunt.  There is no evidence for chronic liver disease nor malignant  ascites.   RECOMMENDATIONS:  A small bowel follow through with upper GI series to  help determine her anatomy from her gastric bypass surgery.  If nothing  is seen then I will refer her back to Dr. Phoebe Perch from neurosurgery for  consideration of removal of the stent.     Barbette Hair. Arlyce Dice, MD,FACG  Electronically Signed    RDK/MedQ  DD: 02/02/2007  DT: 02/02/2007  Job #: [id number]

## 2010-09-11 NOTE — Assessment & Plan Note (Signed)
[facility]                         GASTROENTEROLOGY OFFICE NOTE   NAME:Duerksen, MAKENLY LARABEE                   MRN:          [id number]  DATE:02/02/2007                            DOB:          Feb 25, 1976    PROBLEM:  Ascites.   Ms. First has returned for scheduled GI followup.  She had  paracentesis yielding 2 L of ascitic fluid.  Studies have demonstrated a  transudative fluid.  There were less than 300 white cells and 3% polys.  Cytology was negative.  Exam was done on October 3.  She has  reaccumulated ascites.   LABORATORY DATA:  Labs including CA-125 were normal.   PHYSICAL EXAMINATION:  VITAL SIGNS:  Pulse 84, blood pressure 112/72,  weight 204.   IMPRESSION:  Ascites.  Etiology is unclear. For unknown reasons she does  not seem to be absorbing her ascitic fluid that is produced by her  shunt.  There is no evidence for chronic liver disease nor malignant  ascites.   RECOMMENDATIONS:  A small bowel follow through with upper GI series to  help determine her anatomy from her gastric bypass surgery.  If nothing  is seen then I will refer her back to Dr. Phoebe Perch from neurosurgery for  consideration of removal of the stent.     Barbette Hair. Arlyce Dice, MD,FACG  Electronically Signed    RDK/MedQ  DD: 02/02/2007  DT: 02/02/2007  Job #: [id number]   cc:   Clydene Fake, M.D.

## 2010-09-14 NOTE — Assessment & Plan Note (Signed)
[facility]                           PRIMARY CARE OFFICE NOTE   NAME:[name] [name] NEGRON                   MRN:          [id number]  DATE:08/14/2006                            DOB:          October 07, 1975    CHIEF COMPLAINT:  New patient to practice.   HISTORY OF PRESENT ILLNESS:  The patient is a 35 year old African  American female here to establish primary care.  She was previously  followed by one of the [facility] physicians.  She reports history of  pseudotumor cerebri in 1994.  Due to complications, the patient is now  blind.  At the same time, she had an episode of Guillain-Barre with  right sided weakness.  This has resolved.  She has an LP shunt.  She has  not had follow up with a Neurosurgeon in approximately 18 months and two  years.  She also reports having weight loss surgery/duodenal switch.  This was performed in [place].  She has lost a significant amount of  weight.  Her presurgery weight was over 300 pounds.  She is currently at  the 208 pounds.  There have been some absorption issues.  She takes  multiple vitamins and also calcium with vitamin D.  She is due for  follow up labs to assess her nutritional status.   She has also been treated for depression since 1994.  She has taken  Zoloft 50 mg once daily.   PAST MEDICAL HISTORY:  1. Pseudotumor cerebri complicated by blindness in 1994.  2. History of Guillain-Barre with right sided weakness.  3. Status post LP shunt.  4. Depression.  5. Status post weight loss surgery/duodenal switch.   SOCIAL HISTORY:  The patient is single, lives alone.  She is originally  from [place], [place].  She does not have any children.   HABITS:  No alcohol or tobacco.   FAMILY HISTORY:  Mother and father are in their early 15's.  Mother has  a history of hypertension, anxiety/depression, hyperlipidemia.  Father  is 38 and has type 2 diabetes, hypertension and hyperlipidemia.  She has  three  brothers who are healthy.  She did not report any family history  of cancer.   REVIEW OF SYSTEMS:  No allergy symptoms.  Denies any chest pain or  shortness of breath.  No heartburn.  No dysuria, frequency or urgency.  She does have a history of STD.  Has trichomonas.  Has had some issues  with recurrent vaginal yeast infection and request referral to a new  OB/GYN.  She was last sexually active four months ago.   PHYSICAL EXAMINATION:  VITAL SIGNS:  Weight 208 pounds, temperature  97.2, pulse 93, blood pressure 126/81.  GENERAL:  The patient is a pleasant 35 year old African American female  in no apparent distress.  HEENT:  Normocephalic, atraumatic.  Pupils are dilated and nonresponsive  bilaterally.  No scleral icterus.  Conjunctivae within normal limits.  Oropharyngeal exam is unremarkable.  External auditory canals and  tympanic membranes clear bilaterally.  Hearing was grossly normal.  NECK:  Supple.  No adenopathy, carotid bruit or thyromegaly.  CHEST:  Normal respiratory effort.  Clear to auscultation bilaterally.  No rhonchi, rales or wheezes.  CARDIOVASCULAR:  Regular rate.  No murmurs, rubs or gallops appreciated.  ABDOMEN:  Soft.  The patient had a large panus, midline abdominal scar.  Unable to appreciate organomegaly.  MUSCULOSKELETAL:  No cyanosis, clubbing or edema.  SKIN:  Warm and dry.  The patient had intact pedis dorsalis pulses.  NEUROLOGICAL:  Cranial nerves II-XII grossly intact.  She was nonfocal.  Her mood and affect was appropriate.   IMPRESSION/RECOMMENDATIONS:  1. History of weight loss surgery/duodenal switch.  2. History of pseudotumor cerebri complicated by blindness.  3. History of Guillain-Barre.  4. Status post LP shunt.  5. Depression.  6. Recurrent vaginal yeast infection.  7. Health maintenance.   RECOMMENDATIONS:  1. We will check her nutritional status.  We received a fax from her      surgeon, Dr. Silvestre Mesi, listing recommended  follow up labs.  2. She will continue her Zoloft at 50 mg daily.  She is considering      tapering off antidepressants.  3. She will likely need follow up for her weight loss surgery/duodenal      switch, and we may consider referring her to [facility]      for subsequent care.  4. We will refer her to a local OB/GYN regarding chronic vaginal yeast      infection.  5. Follow up time is in approximately 6 weeks.     Mackenzie Hair. Artist Pais, DO  Electronically Signed    RDY/MedQ  DD: 08/15/2006  DT: 08/15/2006  Job #: [id number]

## 2010-09-14 NOTE — Procedures (Signed)
NAME:  Mackenzie [name], Mackenzie [name]            ACCOUNT NO.:  [id number]   MEDICAL RECORD NO.:  [id number]          PATIENT TYPE:  OUT   LOCATION:  SLEEP CENTER                 FACILITY:  [facility]   PHYSICIAN:  Clinton D. Maple Hudson, M.D. DATE OF BIRTH:  10-Jun-1975   DATE OF STUDY:  01/13/2005                              NOCTURNAL POLYSOMNOGRAM   REFERRING PHYSICIAN:  Dr. Leslee Home.   DATE OF STUDY:  January 13, 2005.   INDICATION FOR STUDY:  Hypersomnia with sleep apnea. Epworth sleepiness  score 8/24, BMI 46. Weight 194 pounds.   SLEEP ARCHITECTURE:  Total sleep time 397 minutes with sleep efficiency 94%.  Stage I was 4%, stage II 52%, stages III and IV 18%, REM 26% of total sleep  time. Sleep latency 8 minutes, REM latency 61 minutes, awake after sleep  onset 17 minutes, arousal index 25. No bedtime medication taken.   RESPIRATORY DATA:  Apnea/hypopnea index (AHI, RDI) 5.1 obstructive events  per hour which is at the upper limit of normal for very minimal obstructive  sleep apnea. There were 1 obstructive apnea and 33 hypopneas. Events were  most common per hour while sleeping supine which she spent almost all of her  sleep time on her left side. The technician indicates that she was not able  to stay on her back very long for unspecified reasons. She did not meet  criteria for split study CPAP titration on the study night because there  were not enough events.   OXYGEN DATA:  Mild snoring with oxygen desaturation briefly to a nadir of  86%. Mean oxygen saturation through the study was 98% on room air.   CARDIAC DATA:  Normal sinus rhythm.   MOVEMENT/PARASOMNIA:  Occasional leg jerk with little effect on sleep.   IMPRESSION/RECOMMENDATIONS:  1.  Occasional sleep disordered obstructive respiratory events, mainly      hypopneas during the limited time she was sleeping on her back. These      are largely prevented by sleeping on her sides which seems to be her      preferred      position.  Otherwise sleep architecture was unremarkable despite impaired      vision which may affect circadian rhythm.      Clinton D. Maple Hudson, M.D.  Diplomate, Biomedical engineer of Sleep Medicine  Electronically Signed     CDY/MEDQ  D:  01/20/2005 13:18:52  T:  01/20/2005 23:07:50  Job:  [id number]

## 2010-10-09 ENCOUNTER — Other Ambulatory Visit: Payer: Self-pay | Admitting: Obstetrics and Gynecology

## 2010-10-09 DIAGNOSIS — N63 Unspecified lump in unspecified breast: Secondary | ICD-10-CM

## 2010-10-16 ENCOUNTER — Ambulatory Visit
Admission: RE | Admit: 2010-10-16 | Discharge: 2010-10-16 | Disposition: A | Discharge status: Home / Self Care | Payer: Medicare PPO | Source: Ambulatory Visit | Attending: Obstetrics and Gynecology | Admitting: Obstetrics and Gynecology

## 2010-10-16 DIAGNOSIS — N63 Unspecified lump in unspecified breast: Secondary | ICD-10-CM

## 2010-10-17 ENCOUNTER — Other Ambulatory Visit: Payer: Self-pay | Admitting: Hematology and Oncology

## 2010-10-17 ENCOUNTER — Encounter (HOSPITAL_BASED_OUTPATIENT_CLINIC_OR_DEPARTMENT_OTHER): Payer: Medicare PPO | Admitting: Hematology and Oncology

## 2010-10-17 DIAGNOSIS — Z9884 Bariatric surgery status: Secondary | ICD-10-CM

## 2010-10-17 DIAGNOSIS — E282 Polycystic ovarian syndrome: Secondary | ICD-10-CM

## 2010-10-17 DIAGNOSIS — D649 Anemia, unspecified: Secondary | ICD-10-CM

## 2010-10-17 DIAGNOSIS — N920 Excessive and frequent menstruation with regular cycle: Secondary | ICD-10-CM

## 2010-10-17 LAB — CBC WITH DIFFERENTIAL/PLATELET
BASO%: 0.5 % (ref 0.0–2.0)
Basophils Absolute: 0 10*3/uL (ref 0.0–0.1)
EOS%: 0.8 % (ref 0.0–7.0)
Eosinophils Absolute: 0.1 10*3/uL (ref 0.0–0.5)
HCT: 26.6 % — ABNORMAL LOW (ref 34.8–46.6)
HGB: 8.4 g/dL — ABNORMAL LOW (ref 11.6–15.9)
LYMPH%: 18.9 % (ref 14.0–49.7)
MCH: 24.7 pg — ABNORMAL LOW (ref 25.1–34.0)
MCHC: 31.5 g/dL (ref 31.5–36.0)
MCV: 78.3 fL — ABNORMAL LOW (ref 79.5–101.0)
MONO#: 0.6 10*3/uL (ref 0.1–0.9)
MONO%: 9.1 % (ref 0.0–14.0)
NEUT#: 4.9 10*3/uL (ref 1.5–6.5)
NEUT%: 70.7 % (ref 38.4–76.8)
Platelets: 245 10*3/uL (ref 145–400)
RBC: 3.39 10*6/uL — ABNORMAL LOW (ref 3.70–5.45)
RDW: 20.8 % — ABNORMAL HIGH (ref 11.2–14.5)
WBC: 6.9 10*3/uL (ref 3.9–10.3)
lymph#: 1.3 10*3/uL (ref 0.9–3.3)

## 2010-10-17 LAB — IRON AND TIBC
%SAT: 3 % — ABNORMAL LOW (ref 20–55)
Iron: 12 ug/dL — ABNORMAL LOW (ref 42–145)
TIBC: 360 ug/dL (ref 250–470)
UIBC: 348 ug/dL

## 2010-10-17 LAB — COMPREHENSIVE METABOLIC PANEL
ALT: 16 U/L (ref 0–35)
AST: 23 U/L (ref 0–37)
Albumin: 4.2 g/dL (ref 3.5–5.2)
Alkaline Phosphatase: 44 U/L (ref 39–117)
BUN: 11 mg/dL (ref 6–23)
CO2: 24 mEq/L (ref 19–32)
Calcium: 8.5 mg/dL (ref 8.4–10.5)
Chloride: 107 mEq/L (ref 96–112)
Creatinine, Ser: 0.7 mg/dL (ref 0.50–1.10)
Glucose, Bld: 85 mg/dL (ref 70–99)
Potassium: 4.4 mEq/L (ref 3.5–5.3)
Sodium: 136 mEq/L (ref 135–145)
Total Bilirubin: 0.2 mg/dL — ABNORMAL LOW (ref 0.3–1.2)
Total Protein: 7.1 g/dL (ref 6.0–8.3)

## 2010-10-17 LAB — FERRITIN: Ferritin: 7 ng/mL — ABNORMAL LOW (ref 10–291)

## 2010-10-23 ENCOUNTER — Encounter (HOSPITAL_BASED_OUTPATIENT_CLINIC_OR_DEPARTMENT_OTHER): Payer: Medicare PPO | Admitting: Hematology and Oncology

## 2010-10-23 DIAGNOSIS — D649 Anemia, unspecified: Secondary | ICD-10-CM

## 2010-10-23 DIAGNOSIS — Z9884 Bariatric surgery status: Secondary | ICD-10-CM

## 2010-10-23 DIAGNOSIS — E282 Polycystic ovarian syndrome: Secondary | ICD-10-CM

## 2010-10-23 DIAGNOSIS — N920 Excessive and frequent menstruation with regular cycle: Secondary | ICD-10-CM

## 2010-10-26 ENCOUNTER — Encounter (HOSPITAL_BASED_OUTPATIENT_CLINIC_OR_DEPARTMENT_OTHER): Payer: Medicare PPO | Admitting: Hematology and Oncology

## 2010-10-26 DIAGNOSIS — Z9884 Bariatric surgery status: Secondary | ICD-10-CM

## 2010-10-26 DIAGNOSIS — D649 Anemia, unspecified: Secondary | ICD-10-CM

## 2010-10-26 DIAGNOSIS — N920 Excessive and frequent menstruation with regular cycle: Secondary | ICD-10-CM

## 2010-10-26 DIAGNOSIS — E282 Polycystic ovarian syndrome: Secondary | ICD-10-CM

## 2010-11-02 ENCOUNTER — Encounter (INDEPENDENT_AMBULATORY_CARE_PROVIDER_SITE_OTHER): Payer: Self-pay | Admitting: Surgery

## 2010-11-02 ENCOUNTER — Ambulatory Visit (INDEPENDENT_AMBULATORY_CARE_PROVIDER_SITE_OTHER): Payer: Medicare PPO | Admitting: Surgery

## 2010-11-02 VITALS — BP 122/78 | HR 91 | Ht 66.0 in | Wt 186.0 lb

## 2010-11-02 DIAGNOSIS — N6320 Unspecified lump in the left breast, unspecified quadrant: Secondary | ICD-10-CM | POA: Insufficient documentation

## 2010-11-02 DIAGNOSIS — N63 Unspecified lump in unspecified breast: Secondary | ICD-10-CM

## 2010-11-02 NOTE — Progress Notes (Signed)
Subjective:     Patient ID: Mackenzie [name], female   DOB: 22-Aug-1975, 35 y.o.   MRN: [id number]    BP 122/78  Pulse 91  Ht 5\' 6"  (1.676 m)  Wt 186 lb (84.369 kg)  BMI 30.02 kg/m2    HPIThe patient presents today due to a left breast mass. She is sent at the request of Dr. Avis Epley and the breast [facility]. She complains the mass is getting bigger and causing some distortion of her left breast. It is not causing any pain. She denies any nipple discharge. The increase in size it is concerning to the patient. She was evaluated by ultrasound and mammography found to have what appears to be a 2.6 cm mass left breast around 12:00 with the characteristics of a fibroadenoma. Core biopsy was offered to her she would like to have it removed. Denies any redness, drainage, or any other masses.   Review of Systems  Constitutional: Negative.   HENT:       Blind  Eyes: Negative.   Respiratory: Negative.   Cardiovascular: Negative.   Gastrointestinal: Positive for abdominal pain.  Genitourinary: Negative.   Musculoskeletal: Negative.   Skin: Negative.   Neurological: Negative.   Hematological: Negative.   Psychiatric/Behavioral: Negative.        Objective:   Physical Exam  Constitutional: She is oriented to person, place, and time. She appears well-developed and well-nourished.  HENT:  Head: Normocephalic and atraumatic.  Nose: Nose normal.  Eyes: Conjunctivae are normal. No scleral icterus.       Blind   Neck: Normal range of motion. Neck supple.  Cardiovascular: Normal rate, regular rhythm and normal heart sounds.   Pulmonary/Chest: Effort normal and breath sounds normal.       Left breast mass 12-1 oclock 2 cm from nipple 2 cm in size.  Mobile. Slight skin dimpling.  Abdominal: Soft. She exhibits no distension.       Midline scar  Musculoskeletal: Normal range of motion.  Neurological: She is alert and oriented to person, place, and time.  Skin: Skin is warm and  dry.  Psychiatric: She has a normal mood and affect. Her behavior is normal. Judgment and thought content normal.       Assessment:      Left breast mass Plan:    The patient has a dominant left breast mass. His been present for the last year but has increased in size. Is located in the upper central left breast. Mammography and ultrasound showed this to be roughly 2 cm in size. Clinical examination correlates with that assessment. She does not wish core biopsy and wishes to have the mass excised from her left breast due to be increasing in size.The procedure has been discussed with the patient. Alternatives to surgery have been discussed with the patient.  Risks of surgery include bleeding,  Infection,  Seroma formation,  and the need for further surgery.   The patient understands and wishes to proceed.

## 2010-11-02 NOTE — Patient Instructions (Signed)
You will be scheduled for removal of the left breast mass as an outpatient.

## 2010-12-12 ENCOUNTER — Other Ambulatory Visit (INDEPENDENT_AMBULATORY_CARE_PROVIDER_SITE_OTHER): Payer: Self-pay | Admitting: Surgery

## 2010-12-12 DIAGNOSIS — D249 Benign neoplasm of unspecified breast: Secondary | ICD-10-CM

## 2010-12-17 ENCOUNTER — Telehealth (INDEPENDENT_AMBULATORY_CARE_PROVIDER_SITE_OTHER): Payer: Self-pay | Admitting: Surgery

## 2010-12-25 ENCOUNTER — Other Ambulatory Visit: Payer: Self-pay | Admitting: Hematology and Oncology

## 2010-12-25 ENCOUNTER — Encounter (HOSPITAL_BASED_OUTPATIENT_CLINIC_OR_DEPARTMENT_OTHER): Payer: Medicare PPO | Admitting: Hematology and Oncology

## 2010-12-25 DIAGNOSIS — N920 Excessive and frequent menstruation with regular cycle: Secondary | ICD-10-CM

## 2010-12-25 DIAGNOSIS — Z9884 Bariatric surgery status: Secondary | ICD-10-CM

## 2010-12-25 DIAGNOSIS — D649 Anemia, unspecified: Secondary | ICD-10-CM

## 2010-12-25 DIAGNOSIS — E282 Polycystic ovarian syndrome: Secondary | ICD-10-CM

## 2010-12-25 LAB — IRON AND TIBC
%SAT: 16 % — ABNORMAL LOW (ref 20–55)
Iron: 40 ug/dL — ABNORMAL LOW (ref 42–145)
TIBC: 254 ug/dL (ref 250–470)
UIBC: 214 ug/dL (ref 125–400)

## 2010-12-25 LAB — BASIC METABOLIC PANEL
BUN: 8 mg/dL (ref 6–23)
CO2: 23 mEq/L (ref 19–32)
Calcium: 8.8 mg/dL (ref 8.4–10.5)
Chloride: 106 mEq/L (ref 96–112)
Creatinine, Ser: 0.63 mg/dL (ref 0.50–1.10)
Glucose, Bld: 84 mg/dL (ref 70–99)
Potassium: 3.9 mEq/L (ref 3.5–5.3)
Sodium: 137 mEq/L (ref 135–145)

## 2010-12-25 LAB — CBC WITH DIFFERENTIAL/PLATELET
BASO%: 0 % (ref 0.0–2.0)
Basophils Absolute: 0 10*3/uL (ref 0.0–0.1)
EOS%: 0.6 % (ref 0.0–7.0)
Eosinophils Absolute: 0.1 10*3/uL (ref 0.0–0.5)
HCT: 33.9 % — ABNORMAL LOW (ref 34.8–46.6)
HGB: 11.4 g/dL — ABNORMAL LOW (ref 11.6–15.9)
LYMPH%: 10.3 % — ABNORMAL LOW (ref 14.0–49.7)
MCH: 30.8 pg (ref 25.1–34.0)
MCHC: 33.4 g/dL (ref 31.5–36.0)
MCV: 92.3 fL (ref 79.5–101.0)
MONO#: 0.6 10*3/uL (ref 0.1–0.9)
MONO%: 4.7 % (ref 0.0–14.0)
NEUT#: 11 10*3/uL — ABNORMAL HIGH (ref 1.5–6.5)
NEUT%: 84.4 % — ABNORMAL HIGH (ref 38.4–76.8)
Platelets: 169 10*3/uL (ref 145–400)
RBC: 3.68 10*6/uL — ABNORMAL LOW (ref 3.70–5.45)
RDW: 21.3 % — ABNORMAL HIGH (ref 11.2–14.5)
WBC: 13 10*3/uL — ABNORMAL HIGH (ref 3.9–10.3)
lymph#: 1.3 10*3/uL (ref 0.9–3.3)

## 2010-12-25 LAB — FERRITIN: Ferritin: 112 ng/mL (ref 10–291)

## 2011-01-03 ENCOUNTER — Encounter (INDEPENDENT_AMBULATORY_CARE_PROVIDER_SITE_OTHER): Payer: Medicare PPO | Admitting: Surgery

## 2011-01-18 ENCOUNTER — Encounter (INDEPENDENT_AMBULATORY_CARE_PROVIDER_SITE_OTHER): Payer: Medicare PPO | Admitting: Surgery

## 2011-01-23 LAB — URINALYSIS, ROUTINE W REFLEX MICROSCOPIC
Glucose, UA: NEGATIVE
Ketones, ur: NEGATIVE
Leukocytes, UA: NEGATIVE
Nitrite: NEGATIVE
Protein, ur: 30 — AB
Specific Gravity, Urine: >1.03 — ABNORMAL HIGH
Urobilinogen, UA: 1
pH: 5.5

## 2011-01-23 LAB — POCT PREGNANCY, URINE
Operator id: 29278
Preg Test, Ur: NEGATIVE

## 2011-01-23 LAB — CBC
HCT: 26.5 — ABNORMAL LOW
Hemoglobin: 8.4 — ABNORMAL LOW
MCHC: 31.9
MCV: 78.4
Platelets: 217
RBC: 3.38 — ABNORMAL LOW
RDW: 21.6 — ABNORMAL HIGH
WBC: 10.5

## 2011-01-23 LAB — URINE MICROSCOPIC-ADD ON

## 2011-02-07 LAB — ALBUMIN, FLUID (OTHER): Albumin, Fluid: 1.1

## 2011-02-07 LAB — AMYLASE, BODY FLUID: Amylase, Fluid: 25

## 2011-02-07 LAB — BODY FLUID CELL COUNT WITH DIFFERENTIAL
Lymphs, Fluid: 87
Monocyte-Macrophage-Serous Fluid: 10 — ABNORMAL LOW
Neutrophil Count, Fluid: 3
Total Nucleated Cell Count, Fluid: 290

## 2011-02-07 LAB — BODY FLUID CULTURE
Culture: NO GROWTH
Gram Stain: NONE SEEN

## 2011-02-07 LAB — PATHOLOGIST SMEAR REVIEW

## 2011-02-07 LAB — PROTEIN, BODY FLUID: Total protein, fluid: 3.3

## 2011-02-15 ENCOUNTER — Encounter (INDEPENDENT_AMBULATORY_CARE_PROVIDER_SITE_OTHER): Payer: Self-pay | Admitting: Surgery

## 2011-02-15 ENCOUNTER — Ambulatory Visit (INDEPENDENT_AMBULATORY_CARE_PROVIDER_SITE_OTHER): Payer: Medicare PPO | Admitting: Surgery

## 2011-02-15 VITALS — BP 130/80 | HR 72 | Temp 98.4°F | Resp 12 | Ht 67.0 in | Wt 185.0 lb

## 2011-02-15 DIAGNOSIS — Z9889 Other specified postprocedural states: Secondary | ICD-10-CM

## 2011-02-15 NOTE — Patient Instructions (Signed)
Follow up as needed.  Will Refer to Dr Kelly Splinter of plastic surgery for discussion of breast reduction surgery.

## 2011-02-15 NOTE — Progress Notes (Signed)
The patient returns today for follow up.  She is doing well.  She does have some soreness of the breast.  She denies any drainage,  Redness or swelling of the breast. Her pain is controlled. On exam: Left breast incision well-healed. No seroma.  Impression: Status post open left breast excisional biopsy for fibroadenoma measuring 3 point centimeters  Plan: Followup as needed. She does have large breasts request referral to a plastic surgeon to discuss breast reduction surgery.

## 2011-04-03 ENCOUNTER — Encounter (HOSPITAL_COMMUNITY): Payer: Self-pay | Admitting: Emergency Medicine

## 2011-04-03 ENCOUNTER — Emergency Department (HOSPITAL_COMMUNITY)
Admission: EM | Admit: 2011-04-03 | Discharge: 2011-04-03 | Disposition: A | Discharge status: Home / Self Care | Payer: Medicare PPO | Attending: Emergency Medicine | Admitting: Emergency Medicine

## 2011-04-03 DIAGNOSIS — Y92009 Unspecified place in unspecified non-institutional (private) residence as the place of occurrence of the external cause: Secondary | ICD-10-CM | POA: Insufficient documentation

## 2011-04-03 DIAGNOSIS — W292XXA Contact with other powered household machinery, initial encounter: Secondary | ICD-10-CM | POA: Insufficient documentation

## 2011-04-03 DIAGNOSIS — S61218A Laceration without foreign body of other finger without damage to nail, initial encounter: Secondary | ICD-10-CM

## 2011-04-03 DIAGNOSIS — S61209A Unspecified open wound of unspecified finger without damage to nail, initial encounter: Secondary | ICD-10-CM | POA: Insufficient documentation

## 2011-04-03 DIAGNOSIS — H543 Unqualified visual loss, both eyes: Secondary | ICD-10-CM | POA: Insufficient documentation

## 2011-04-03 NOTE — ED Notes (Signed)
C/o approx 2 cm lac to R 2nd digit since 1900.  States she cut it with a "stick blender."  Laceration still bleeding on arrival.

## 2011-04-03 NOTE — ED Notes (Signed)
Pt remains in waiting area states condition unchanged pain level unchanged

## 2011-04-03 NOTE — ED Provider Notes (Signed)
History     CSN: [id number] Arrival date & time: 04/03/2011  8:21 PM   First MD Initiated Contact with Patient 04/03/11 2239      Chief Complaint  Patient presents with  . Extremity Laceration    HPI  History provided by the patient and family. Patient reports preparing dinner tonight and using a blender. She was reaching inside and cut the end of her right index finger on the blender blades. She complains of small cut to finger. She denies numbness or tingling in finger. Patient has full range of motion of finger. Patient is left-handed. Patient has significant past history of blindness and use his fingers to read brail. She has no other significant past medical history. Patient reports being up-to-date on tetanus shot.   Past Medical History  Diagnosis Date  . Breast lump left  . Anemia   . Blind [id number]    Past Surgical History  Procedure Date  . Eye decompression x3 1994  . Lp shunt 1994    removed in 2009  . Rurod switch 2007    weight loss surgery done in [place]  . Fetal blood transfusion Vanessa Kick 79,2956    Family History  Problem Relation Age of Onset  . Cancer Maternal Aunt     breast-passed in 2012    History  Substance Use Topics  . Smoking status: Current Everyday Smoker -- 0.5 packs/day    Types: Cigarettes  . Smokeless tobacco: Not on file  . Alcohol Use: No    OB History    Grav Para Term Preterm Abortions TAB SAB Ect Mult Living                  Review of Systems  All other systems reviewed and are negative.    Allergies  Review of patient's allergies indicates no known allergies.  Home Medications   Current Outpatient Rx  Name Route Sig Dispense Refill  . BUPROPION HCL ER (SR) 150 MG PO TB12 Oral Take 150 mg by mouth 2 (two) times daily.      Marland Kitchen FERROUS GLUCONATE 325 MG PO TABS Oral Take 325 mg by mouth 2 (two) times daily.      . MULTIVITAMINS PO CAPS Oral Take 1 capsule by mouth daily.        BP 119/84  Pulse 74  Temp(Src) 98.2 F  (36.8 C) (Oral)  Resp 16  SpO2 97%  LMP 03/27/2011  Physical Exam  Nursing note and vitals reviewed. Constitutional: She is oriented to person, place, and time. She appears well-developed and well-nourished.  HENT:  Head: Normocephalic.  Cardiovascular: Normal rate, regular rhythm and normal heart sounds.   Pulmonary/Chest: Effort normal and breath sounds normal.  Musculoskeletal: Normal range of motion.  Neurological: She is alert and oriented to person, place, and time.  Skin:       1 cm laceration to the distal right lateral index finger. Second 1.5 cm superficial laceration to plantar and medial distal right index finger. 1 cm laceration over the finger nail of right index finger. No active bleeding. Normal cap refill and distal sensations. Patient has full range of motion of finger.  Psychiatric: She has a normal mood and affect. Her behavior is normal.    ED Course  Procedures (including critical care time)  LACERATION REPAIR Performed by: Angus Seller Authorized by: Angus Seller Consent: Verbal consent obtained. Risks and benefits: risks, benefits and alternatives were discussed Consent given by: patient Patient identity confirmed: provided demographic data  Prepped and Draped in normal sterile fashion Wound explored  Laceration Location: Distal lateral right index finger  Laceration Length: 1cm  No Foreign Bodies seen or palpated  Anesthesia: None   Irrigation method: syringe Amount of cleaning: standard  Skin closure: Skin with Dermabond   Patient tolerance: Patient tolerated the procedure well with no immediate complications.   Other wounds on the finger were bandaged with bacitracin applied.   1. Laceration of index finger      MDM  10:45 PM patient seen and evaluated. Patient no acute distress.  I discussed with patient option for x-ray or a finger to rule out any fracture. Patient declined to have an x-ray does not wish to have one. I also  discussed with patient options for skin closure with sutures or Dermabond. Patient wishes to have Dermabond and then wrap finger to limit mobility.      Angus Seller, [place] 04/03/11 [phone]

## 2011-04-03 NOTE — ED Provider Notes (Signed)
Medical screening examination/treatment/procedure(s) were performed by non-physician practitioner and as supervising physician I was immediately available for consultation/collaboration.   Serin Thornell W Thania Woodlief, MD 04/03/11 2352 

## 2011-06-10 ENCOUNTER — Ambulatory Visit (HOSPITAL_COMMUNITY)
Admission: RE | Admit: 2011-06-10 | Discharge: 2011-06-10 | Disposition: A | Discharge status: Home / Self Care | Payer: Medicare PPO | Source: Ambulatory Visit | Attending: Obstetrics and Gynecology | Admitting: Obstetrics and Gynecology

## 2011-06-10 DIAGNOSIS — M79609 Pain in unspecified limb: Secondary | ICD-10-CM | POA: Insufficient documentation

## 2011-06-10 DIAGNOSIS — M79669 Pain in unspecified lower leg: Secondary | ICD-10-CM

## 2011-06-10 NOTE — Progress Notes (Signed)
PRELIMINARY   PRELIMINARY  PRELIMINARY  Right lower extremity venous duplex has been completed.  Right leg is negative for deep and superficial vein thrombosis.   Report called to Arlana Lindau @ 4:50 pm.  1720hrs  06/10/2011  Marina Gravel. Niccolas Loeper, RVT

## 2011-06-17 ENCOUNTER — Other Ambulatory Visit: Payer: Self-pay | Admitting: Obstetrics & Gynecology

## 2011-06-17 DIAGNOSIS — N83209 Unspecified ovarian cyst, unspecified side: Secondary | ICD-10-CM

## 2011-06-18 ENCOUNTER — Ambulatory Visit
Admission: RE | Admit: 2011-06-18 | Discharge: 2011-06-18 | Disposition: A | Discharge status: Home / Self Care | Payer: Medicare PPO | Source: Ambulatory Visit | Attending: Obstetrics & Gynecology | Admitting: Obstetrics & Gynecology

## 2011-06-18 DIAGNOSIS — N83209 Unspecified ovarian cyst, unspecified side: Secondary | ICD-10-CM

## 2011-06-18 MED ORDER — IOHEXOL 300 MG/ML  SOLN
100.0000 mL | Freq: Once | INTRAMUSCULAR | Status: AC | PRN
  Administered 2011-06-18: 100 mL via INTRAVENOUS

## 2011-07-02 ENCOUNTER — Encounter (HOSPITAL_COMMUNITY): Payer: Self-pay | Admitting: Pharmacist

## 2011-07-10 ENCOUNTER — Other Ambulatory Visit: Payer: Self-pay | Admitting: Obstetrics & Gynecology

## 2011-07-11 ENCOUNTER — Encounter (HOSPITAL_COMMUNITY): Payer: Self-pay

## 2011-07-11 ENCOUNTER — Encounter (HOSPITAL_COMMUNITY)
Admission: RE | Admit: 2011-07-11 | Discharge: 2011-07-11 | Disposition: A | Discharge status: Home / Self Care | Payer: Medicare PPO | Source: Ambulatory Visit | Attending: Obstetrics & Gynecology | Admitting: Obstetrics & Gynecology

## 2011-07-11 HISTORY — DX: Myoneural disorder, unspecified: G70.9

## 2011-07-11 HISTORY — DX: Benign intracranial hypertension: G93.2

## 2011-07-11 HISTORY — DX: Depression, unspecified: F32.A

## 2011-07-11 HISTORY — DX: Major depressive disorder, single episode, unspecified: F32.9

## 2011-07-11 HISTORY — DX: Personal history of other diseases of the digestive system: Z87.19

## 2011-07-11 HISTORY — DX: Encounter for other specified aftercare: Z51.89

## 2011-07-11 LAB — CBC
HCT: 37.2 % (ref 36.0–46.0)
Hemoglobin: 12 g/dL (ref 12.0–15.0)
MCH: 32.8 pg (ref 26.0–34.0)
MCHC: 32.3 g/dL (ref 30.0–36.0)
MCV: 101.6 fL — ABNORMAL HIGH (ref 78.0–100.0)
Platelets: 154 10*3/uL (ref 150–400)
RBC: 3.66 MIL/uL — ABNORMAL LOW (ref 3.87–5.11)
RDW: 12.9 % (ref 11.5–15.5)
WBC: 11.3 10*3/uL — ABNORMAL HIGH (ref 4.0–10.5)

## 2011-07-11 LAB — COMPREHENSIVE METABOLIC PANEL
ALT: 19 U/L (ref 0–35)
AST: 21 U/L (ref 0–37)
Albumin: 4.2 g/dL (ref 3.5–5.2)
Alkaline Phosphatase: 43 U/L (ref 39–117)
BUN: 9 mg/dL (ref 6–23)
CO2: 25 mEq/L (ref 19–32)
Calcium: 9.1 mg/dL (ref 8.4–10.5)
Chloride: 104 mEq/L (ref 96–112)
Creatinine, Ser: 0.67 mg/dL (ref 0.50–1.10)
GFR calc Af Amer: >90 mL/min (ref >90)
GFR calc non Af Amer: >90 mL/min (ref >90)
Glucose, Bld: 82 mg/dL (ref 70–99)
Potassium: 3.9 mEq/L (ref 3.5–5.1)
Sodium: 136 mEq/L (ref 135–145)
Total Bilirubin: 0.6 mg/dL (ref 0.3–1.2)
Total Protein: 7.4 g/dL (ref 6.0–8.3)

## 2011-07-11 LAB — SURGICAL PCR SCREEN
MRSA, PCR: NEGATIVE
Staphylococcus aureus: POSITIVE — AB

## 2011-07-11 NOTE — Pre-Procedure Instructions (Signed)
Informed LaFlita in Lab at [facility] of patient's history of ? iron transfusion/blood transfusion at [facility] in 04/2010.

## 2011-07-11 NOTE — Patient Instructions (Signed)
   Your procedure is scheduled on: Thursday, March 28th  Enter through the Main Entrance of [facility] at: [employer] up the phone at the desk and dial [phone] and inform us of your arrival.  Please call this number if you have any problems the morning of surgery: [phone]  Remember: Do not eat food after midnight: Wednesday Do not drink clear liquids after: Wednesday Take these medicines the morning of surgery with a SIP OF WATER: None  Do not wear jewelry, make-up, or FINGER nail polish Do not wear lotions, powders, perfumes or deodorant. Do not shave 48 hours prior to surgery. Do not bring valuables to the hospital. Contacts, dentures or bridgework may not be worn into surgery.  Leave suitcase in the car. After Surgery it may be brought to your room. For patients being admitted to the hospital, checkout time is 11:00am the day of discharge.  Patients discharged on the day of surgery will not be allowed to drive home.   Home with mother Mackenzie [name]  cell [phone]   Remember to use your hibiclens as instructed.Please shower with 1/2 bottle the evening before your surgery and the other 1/2 bottle the morning of surgery. Neck down avoiding private area.

## 2011-07-24 MED ORDER — CEFAZOLIN SODIUM-DEXTROSE 2-3 GM-% IV SOLR
2.0000 g | INTRAVENOUS | Status: AC
  Administered 2011-07-25: 2 g via INTRAVENOUS
  Filled 2011-07-24: qty 50

## 2011-07-25 ENCOUNTER — Ambulatory Visit (HOSPITAL_COMMUNITY): Payer: Medicare PPO | Admitting: Anesthesiology

## 2011-07-25 ENCOUNTER — Ambulatory Visit (HOSPITAL_COMMUNITY)
Admission: RE | Admit: 2011-07-25 | Discharge: 2011-07-25 | Disposition: A | Discharge status: Home / Self Care | Payer: Medicare PPO | Source: Ambulatory Visit | Attending: Obstetrics & Gynecology | Admitting: Obstetrics & Gynecology

## 2011-07-25 ENCOUNTER — Encounter (HOSPITAL_COMMUNITY)
Admission: RE | Disposition: A | Discharge status: Home / Self Care | Payer: Self-pay | Source: Ambulatory Visit | Attending: Obstetrics & Gynecology

## 2011-07-25 ENCOUNTER — Encounter (HOSPITAL_COMMUNITY): Payer: Self-pay | Admitting: Anesthesiology

## 2011-07-25 ENCOUNTER — Encounter (HOSPITAL_COMMUNITY): Payer: Self-pay | Admitting: *Deleted

## 2011-07-25 DIAGNOSIS — N838 Other noninflammatory disorders of ovary, fallopian tube and broad ligament: Secondary | ICD-10-CM | POA: Insufficient documentation

## 2011-07-25 DIAGNOSIS — N971 Female infertility of tubal origin: Secondary | ICD-10-CM | POA: Insufficient documentation

## 2011-07-25 DIAGNOSIS — Z01812 Encounter for preprocedural laboratory examination: Secondary | ICD-10-CM | POA: Insufficient documentation

## 2011-07-25 DIAGNOSIS — N803 Endometriosis of pelvic peritoneum, unspecified: Secondary | ICD-10-CM | POA: Insufficient documentation

## 2011-07-25 DIAGNOSIS — N949 Unspecified condition associated with female genital organs and menstrual cycle: Secondary | ICD-10-CM | POA: Insufficient documentation

## 2011-07-25 DIAGNOSIS — N83209 Unspecified ovarian cyst, unspecified side: Secondary | ICD-10-CM | POA: Insufficient documentation

## 2011-07-25 DIAGNOSIS — N9489 Other specified conditions associated with female genital organs and menstrual cycle: Secondary | ICD-10-CM | POA: Insufficient documentation

## 2011-07-25 DIAGNOSIS — Z01818 Encounter for other preprocedural examination: Secondary | ICD-10-CM | POA: Insufficient documentation

## 2011-07-25 LAB — TYPE AND SCREEN
ABO/RH(D): O POS
Antibody Screen: NEGATIVE

## 2011-07-25 LAB — PREGNANCY, URINE: Preg Test, Ur: NEGATIVE

## 2011-07-25 LAB — ABO/RH: ABO/RH(D): O POS

## 2011-07-25 SURGERY — ROBOTIC ASSISTED LAPAROSCOPIC OVARIAN CYSTECTOMY
Anesthesia: General | Site: Abdomen | Laterality: Bilateral | Wound class: Clean Contaminated

## 2011-07-25 MED ORDER — METHYLENE BLUE 1 % INJ SOLN
INTRAMUSCULAR | Status: AC
  Filled 2011-07-25: qty 1

## 2011-07-25 MED ORDER — NEOSTIGMINE METHYLSULFATE 1 MG/ML IJ SOLN
INTRAMUSCULAR | Status: AC
  Filled 2011-07-25: qty 10

## 2011-07-25 MED ORDER — MIDAZOLAM HCL 2 MG/2ML IJ SOLN: 0.5000 mg | Freq: Once | INTRAMUSCULAR | Status: DC | PRN

## 2011-07-25 MED ORDER — FENTANYL CITRATE 0.05 MG/ML IJ SOLN
INTRAMUSCULAR | Status: AC
  Filled 2011-07-25: qty 5

## 2011-07-25 MED ORDER — FENTANYL CITRATE 0.05 MG/ML IJ SOLN
INTRAMUSCULAR | Status: AC
  Administered 2011-07-25: 50 ug via INTRAVENOUS
  Filled 2011-07-25: qty 2

## 2011-07-25 MED ORDER — CEFAZOLIN SODIUM 1-5 GM-% IV SOLN
INTRAVENOUS | Status: AC
  Filled 2011-07-25: qty 50

## 2011-07-25 MED ORDER — FENTANYL CITRATE 0.05 MG/ML IJ SOLN
25.0000 ug | INTRAMUSCULAR | Status: DC | PRN
  Administered 2011-07-25 (×5): 50 ug via INTRAVENOUS

## 2011-07-25 MED ORDER — LIDOCAINE HCL (CARDIAC) 20 MG/ML IV SOLN
INTRAVENOUS | Status: DC | PRN
  Administered 2011-07-25: 60 mg via INTRAVENOUS

## 2011-07-25 MED ORDER — VASOPRESSIN 20 UNIT/ML IJ SOLN
INTRAMUSCULAR | Status: AC
  Filled 2011-07-25: qty 1

## 2011-07-25 MED ORDER — LACTATED RINGERS IV SOLN
INTRAVENOUS | Status: DC
  Administered 2011-07-25: 08:00:00 via INTRAVENOUS
  Administered 2011-07-25: 125 mL/h via INTRAVENOUS
  Administered 2011-07-25: 09:00:00 via INTRAVENOUS

## 2011-07-25 MED ORDER — LACTATED RINGERS IR SOLN
Status: DC | PRN
  Administered 2011-07-25: 3000 mL

## 2011-07-25 MED ORDER — BUPIVACAINE HCL (PF) 0.25 % IJ SOLN
INTRAMUSCULAR | Status: AC
  Filled 2011-07-25: qty 30

## 2011-07-25 MED ORDER — KETOROLAC TROMETHAMINE 30 MG/ML IJ SOLN
INTRAMUSCULAR | Status: AC
  Filled 2011-07-25: qty 1

## 2011-07-25 MED ORDER — LIDOCAINE HCL (CARDIAC) 20 MG/ML IV SOLN
INTRAVENOUS | Status: AC
  Filled 2011-07-25: qty 5

## 2011-07-25 MED ORDER — OXYCODONE-ACETAMINOPHEN 5-325 MG PO TABS
1.0000 | ORAL_TABLET | ORAL | Status: DC | PRN
  Administered 2011-07-25: 1 via ORAL

## 2011-07-25 MED ORDER — FENTANYL CITRATE 0.05 MG/ML IJ SOLN
INTRAMUSCULAR | Status: DC | PRN
  Administered 2011-07-25 (×2): 50 ug via INTRAVENOUS
  Administered 2011-07-25: 150 ug via INTRAVENOUS

## 2011-07-25 MED ORDER — KETOROLAC TROMETHAMINE 30 MG/ML IJ SOLN
INTRAMUSCULAR | Status: DC | PRN
  Administered 2011-07-25: 30 mg via INTRAVENOUS

## 2011-07-25 MED ORDER — BUPIVACAINE HCL (PF) 0.25 % IJ SOLN
INTRAMUSCULAR | Status: DC | PRN
  Administered 2011-07-25: 22 mL

## 2011-07-25 MED ORDER — DEXAMETHASONE SODIUM PHOSPHATE 10 MG/ML IJ SOLN
INTRAMUSCULAR | Status: AC
  Filled 2011-07-25: qty 1

## 2011-07-25 MED ORDER — KETOROLAC TROMETHAMINE 30 MG/ML IJ SOLN: 15.0000 mg | Freq: Once | INTRAMUSCULAR | Status: DC | PRN

## 2011-07-25 MED ORDER — OXYCODONE-ACETAMINOPHEN 7.5-325 MG PO TABS: 1.0000 | ORAL_TABLET | ORAL | Status: AC | PRN

## 2011-07-25 MED ORDER — ACETAMINOPHEN 10 MG/ML IV SOLN
1000.0000 mg | Freq: Once | INTRAVENOUS | Status: AC
  Administered 2011-07-25: 1000 mg via INTRAVENOUS
  Filled 2011-07-25: qty 100

## 2011-07-25 MED ORDER — GLYCOPYRROLATE 0.2 MG/ML IJ SOLN
INTRAMUSCULAR | Status: DC | PRN
  Administered 2011-07-25 (×2): 0.2 mg via INTRAVENOUS

## 2011-07-25 MED ORDER — NEOSTIGMINE METHYLSULFATE 1 MG/ML IJ SOLN
INTRAMUSCULAR | Status: DC | PRN
  Administered 2011-07-25: 1 mg via INTRAVENOUS

## 2011-07-25 MED ORDER — PROMETHAZINE HCL 25 MG/ML IJ SOLN: 6.2500 mg | INTRAMUSCULAR | Status: DC | PRN

## 2011-07-25 MED ORDER — ROCURONIUM BROMIDE 50 MG/5ML IV SOLN
INTRAVENOUS | Status: AC
  Filled 2011-07-25: qty 2

## 2011-07-25 MED ORDER — MIDAZOLAM HCL 2 MG/2ML IJ SOLN
INTRAMUSCULAR | Status: AC
  Filled 2011-07-25: qty 2

## 2011-07-25 MED ORDER — OXYCODONE-ACETAMINOPHEN 5-325 MG PO TABS
ORAL_TABLET | ORAL | Status: AC
  Administered 2011-07-25: 1 via ORAL
  Filled 2011-07-25: qty 1

## 2011-07-25 MED ORDER — PROPOFOL 10 MG/ML IV EMUL
INTRAVENOUS | Status: AC
  Filled 2011-07-25: qty 20

## 2011-07-25 MED ORDER — ACETAMINOPHEN 325 MG PO TABS: 325.0000 mg | ORAL_TABLET | ORAL | Status: DC | PRN

## 2011-07-25 MED ORDER — PROPOFOL 10 MG/ML IV EMUL
INTRAVENOUS | Status: DC | PRN
  Administered 2011-07-25: 170 mg via INTRAVENOUS

## 2011-07-25 MED ORDER — ONDANSETRON HCL 4 MG/2ML IJ SOLN
INTRAMUSCULAR | Status: DC | PRN
  Administered 2011-07-25: 4 mg via INTRAVENOUS

## 2011-07-25 MED ORDER — MEPERIDINE HCL 25 MG/ML IJ SOLN: 6.2500 mg | INTRAMUSCULAR | Status: DC | PRN

## 2011-07-25 MED ORDER — ONDANSETRON HCL 4 MG/2ML IJ SOLN
INTRAMUSCULAR | Status: AC
  Filled 2011-07-25: qty 2

## 2011-07-25 MED ORDER — DEXAMETHASONE SODIUM PHOSPHATE 10 MG/ML IJ SOLN
INTRAMUSCULAR | Status: DC | PRN
  Administered 2011-07-25: 10 mg via INTRAVENOUS

## 2011-07-25 MED ORDER — GLYCOPYRROLATE 0.2 MG/ML IJ SOLN
INTRAMUSCULAR | Status: AC
  Filled 2011-07-25: qty 2

## 2011-07-25 MED ORDER — ROCURONIUM BROMIDE 100 MG/10ML IV SOLN
INTRAVENOUS | Status: DC | PRN
  Administered 2011-07-25: 50 mg via INTRAVENOUS
  Administered 2011-07-25: 20 mg via INTRAVENOUS

## 2011-07-25 MED ORDER — MIDAZOLAM HCL 5 MG/5ML IJ SOLN
INTRAMUSCULAR | Status: DC | PRN
  Administered 2011-07-25: 2 mg via INTRAVENOUS

## 2011-07-25 MED ORDER — STERILE WATER FOR IRRIGATION IR SOLN
Status: DC | PRN
  Administered 2011-07-25 (×2)

## 2011-07-25 SURGICAL SUPPLY — 63 items
ADH SKN CLS APL DERMABOND .7 (GAUZE/BANDAGES/DRESSINGS) ×2
BARRIER ADHS 3X4 INTERCEED (GAUZE/BANDAGES/DRESSINGS) ×3 IMPLANT
BLADE LAPAROSCOPIC MORCELL KIT (BLADE) ×3 IMPLANT
BRR ADH 4X3 ABS CNTRL BYND (GAUZE/BANDAGES/DRESSINGS) ×2
CABLE HIGH FREQUENCY MONO STRZ (ELECTRODE) ×3 IMPLANT
CLOTH BEACON ORANGE TIMEOUT ST (SAFETY) ×3 IMPLANT
CONT PATH 16OZ SNAP LID 3702 (MISCELLANEOUS) ×3 IMPLANT
COVER MAYO STAND STRL (DRAPES) ×3 IMPLANT
COVER TABLE BACK 60X90 (DRAPES) ×6 IMPLANT
COVER TIP SHEARS 8 DVNC (MISCELLANEOUS) ×2 IMPLANT
COVER TIP SHEARS 8MM DA VINCI (MISCELLANEOUS) ×1
DECANTER SPIKE VIAL GLASS SM (MISCELLANEOUS) ×1 IMPLANT
DERMABOND ADVANCED (GAUZE/BANDAGES/DRESSINGS) ×1
DERMABOND ADVANCED .7 DNX12 (GAUZE/BANDAGES/DRESSINGS) ×3 IMPLANT
DRAPE HUG U DISPOSABLE (DRAPE) ×3 IMPLANT
DRAPE LG THREE QUARTER DISP (DRAPES) ×6 IMPLANT
DRAPE MONITOR DA VINCI (DRAPE) IMPLANT
DRAPE WARM FLUID 44X44 (DRAPE) ×3 IMPLANT
ELECT REM PT RETURN 9FT ADLT (ELECTROSURGICAL) ×3
ELECTRODE REM PT RTRN 9FT ADLT (ELECTROSURGICAL) ×2 IMPLANT
EVACUATOR SMOKE 8.L (FILTER) ×3 IMPLANT
GAUZE VASELINE 3X9 (GAUZE/BANDAGES/DRESSINGS) IMPLANT
GLOVE BIO SURGEON STRL SZ 6.5 (GLOVE) ×6 IMPLANT
GOWN STRL REIN XL XLG (GOWN DISPOSABLE) ×18 IMPLANT
IV STOPCOCK 4 WAY 40  W/Y SET (IV SOLUTION) ×1
IV STOPCOCK 4 WAY 40 W/Y SET (IV SOLUTION) ×2 IMPLANT
KIT ACCESSORY DA VINCI DISP (KITS) ×1
KIT ACCESSORY DVNC DISP (KITS) ×2 IMPLANT
KIT DISP ACCESSORY 4 ARM (KITS) IMPLANT
NEEDLE HYPO 22GX1.5 SAFETY (NEEDLE) IMPLANT
OCCLUDER COLPOPNEUMO (BALLOONS) IMPLANT
PACK LAVH (CUSTOM PROCEDURE TRAY) ×3 IMPLANT
PAD PREP 24X48 CUFFED NSTRL (MISCELLANEOUS) ×6 IMPLANT
PROTECTOR NERVE ULNAR (MISCELLANEOUS) ×6 IMPLANT
SET IRRIG TUBING LAPAROSCOPIC (IRRIGATION / IRRIGATOR) ×6 IMPLANT
SOLUTION ELECTROLUBE (MISCELLANEOUS) ×3 IMPLANT
SPONGE LAP 18X18 X RAY DECT (DISPOSABLE) IMPLANT
SUT VIC AB 0 CT1 27 (SUTURE) ×3
SUT VIC AB 0 CT1 27XBRD ANTBC (SUTURE) ×6 IMPLANT
SUT VIC AB 0 CT2 27 (SUTURE) IMPLANT
SUT VIC AB 2-0 CT1 27 (SUTURE)
SUT VIC AB 2-0 CT1 TAPERPNT 27 (SUTURE) IMPLANT
SUT VIC AB 3-0 SH 27 (SUTURE)
SUT VIC AB 3-0 SH 27X BRD (SUTURE) IMPLANT
SUT VIC AB 4-0 PS2 27 (SUTURE) ×6 IMPLANT
SUT VICRYL 0 UR6 27IN ABS (SUTURE) ×6 IMPLANT
SYR 50ML LL SCALE MARK (SYRINGE) ×3 IMPLANT
SYSTEM CONVERTIBLE TROCAR (TROCAR) IMPLANT
TIP UTERINE 5.1X6CM LAV DISP (MISCELLANEOUS) IMPLANT
TIP UTERINE 6.7X10CM GRN DISP (MISCELLANEOUS) IMPLANT
TIP UTERINE 6.7X6CM WHT DISP (MISCELLANEOUS) IMPLANT
TIP UTERINE 6.7X8CM BLUE DISP (MISCELLANEOUS) IMPLANT
TOWEL OR 17X24 6PK STRL BLUE (TOWEL DISPOSABLE) ×9 IMPLANT
TRAY FOLEY BAG SILVER LF 14FR (CATHETERS) ×3 IMPLANT
TROCAR 12M 150ML BLUNT (TROCAR) IMPLANT
TROCAR DISP BLADELESS 8 DVNC (TROCAR) ×2 IMPLANT
TROCAR DISP BLADELESS 8MM (TROCAR) ×1
TROCAR XCEL 12X100 BLDLESS (ENDOMECHANICALS) ×3 IMPLANT
TROCAR XCEL NON-BLD 5MMX100MML (ENDOMECHANICALS) ×3 IMPLANT
TROCAR Z-THREAD BLADED 12X100M (TROCAR) IMPLANT
TUBING FILTER THERMOFLATOR (ELECTROSURGICAL) ×6 IMPLANT
WARMER LAPAROSCOPE (MISCELLANEOUS) ×3 IMPLANT
WATER STERILE IRR 1000ML POUR (IV SOLUTION) ×9 IMPLANT

## 2011-07-25 NOTE — Anesthesia Preprocedure Evaluation (Signed)
Anesthesia Evaluation  Patient identified by MRN, date of birth, ID band Patient awake    Reviewed: Allergy & Precautions, H&P , Patient's Chart, lab work & pertinent test results, reviewed documented beta blocker date and time   History of Anesthesia Complications Negative for: history of anesthetic complications  Airway Mallampati: II TM Distance: >3 FB Neck ROM: full    Dental No notable dental hx.    Pulmonary neg pulmonary ROS,  breath sounds clear to auscultation  Pulmonary exam normal       Cardiovascular Exercise Tolerance: Good negative cardio ROS  Rhythm:regular Rate:Normal     Neuro/Psych negative neurological ROS  negative psych ROS   GI/Hepatic negative GI ROS, Neg liver ROS, hiatal hernia,   Endo/Other  negative endocrine ROS  Renal/GU negative Renal ROS     Musculoskeletal   Abdominal   Peds  Hematology negative hematology ROS (+)   Anesthesia Other Findings Breast lump left   Blind 1994      Blood transfusion 04/2010 cancer center at [place] - iron transfusion per pt Pseudotumor cerebri 1994 History - caused blind    Neuromuscular disorder   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 Anemia   history    H/O hiatal hernia   repaired with weight loss surgery Depression   no meds       Reproductive/Obstetrics negative OB ROS                           Anesthesia Physical Anesthesia Plan  ASA: II  Anesthesia Plan: General ETT   Post-op Pain Management:    Induction:   Airway Management Planned:   Additional Equipment:   Intra-op Plan:   Post-operative Plan:   Informed Consent: I have reviewed the patients History and Physical, chart, labs and discussed the procedure including the risks, benefits and alternatives for the proposed anesthesia with the patient or authorized representative who has indicated his/her understanding and acceptance.   Dental Advisory  Given  Plan Discussed with: CRNA and Surgeon  Anesthesia Plan Comments:         Anesthesia Quick Evaluation

## 2011-07-25 NOTE — Discharge Summary (Signed)
  Physician Discharge Summary  Patient ID: Mackenzie [name] MRN: [id number] DOB/AGE: May 08, 1975 36 y.o.  Admit date: 07/25/2011 Discharge date: 07/25/2011  Admission Diagnoses: Right multicystic ovarian cyst, pelvic pain  Discharge Diagnoses: Right multicystic ovarian cyst, pelvic pain, pelvic endometriosis, adhesions, bilateral tubal disease.        Active Problems:  * No active hospital problems. *    Discharged Condition: good  Hospital Course: Outpatient  Consults: None  Treatments: surgery: Robotic ovarian cystectomies, lysis of adhesions, tuboplasty, chromopertubation  Disposition: 01-Home or Self Care   Medication List  As of 07/25/2011 12:00 PM   TAKE these medications         ferrous gluconate 325 MG tablet   Commonly known as: FERGON   Take 650 mg by mouth 2 (two) times daily.      multivitamin capsule   Take 1 capsule by mouth daily. [name] [name]      naproxen sodium 220 MG tablet   Commonly known as: ANAPROX   Take 440 mg by mouth as needed. For Pain.      OVER THE COUNTER MEDICATION   Take 1 tablet by mouth daily. [name]       oxyCODONE-acetaminophen 7.5-325 MG per tablet   Commonly known as: PERCOCET   Take 1 tablet by mouth every 4 (four) hours as needed for pain.           Follow-up Information    Follow up with Khadijatou Borak,MARIE-LYNE, MD in 3 weeks.   Contact information:   [address] [place] [address] [phone]          SignedGenia Del, MD 07/25/2011, 12:00 PM

## 2011-07-25 NOTE — Discharge Instructions (Signed)
POST-OPERATIVE INSTRUCTIONS TO PATIENT  Call [facility]  for excessive pain, bleeding or temperature greater than or equal to 100.4 degrees (orally).    No lifting (more than 20 lbs) for 3 weeks No sexual activity for 4 weeks  Pain management: Use Ibuprofen 600 mg every 6 hours for 5 days and then as needed.           Use your pain medication as needed to maintain a pain level at or            below 3/10             Use Colace 1-2 capsules per day as long as you are using pain            medication to avoid constipation.       Diet: normal  Bathing: may shower day after surgery  Wound Care: keep incisions clean and dry  Return to Dr. Seymour Bars in 3 weeks  Return to work: To be determined at post operative visit.    Jovie Swanner,MARIE-LYNE MD 07/25/2011 11:59 AM

## 2011-07-25 NOTE — Anesthesia Postprocedure Evaluation (Signed)
  Anesthesia Post-op Note  Patient: Mackenzie [name]  Procedure(s) Performed: Procedure(s) (LRB): ROBOTIC ASSISTED LAPAROSCOPIC OVARIAN CYSTECTOMY (Bilateral)  Patient Location: PACU  Anesthesia Type: General  Level of Consciousness: awake, alert  and oriented  Airway and Oxygen Therapy: Patient Spontanous Breathing  Post-op Pain: none  Post-op Assessment: Post-op Vital signs reviewed, Patient's Cardiovascular Status Stable, Respiratory Function Stable, Patent Airway, No signs of Nausea or vomiting and Pain level controlled  Post-op Vital Signs: Reviewed and stable  Complications: No apparent anesthesia complications

## 2011-07-25 NOTE — Transfer of Care (Signed)
Immediate Anesthesia Transfer of Care Note  Patient: Mackenzie [name]  Procedure(s) Performed: Procedure(s) (LRB): ROBOTIC ASSISTED LAPAROSCOPIC OVARIAN CYSTECTOMY (Bilateral)  Patient Location: PACU  Anesthesia Type: General  Level of Consciousness: awake, alert  and patient cooperative  Airway & Oxygen Therapy: Patient Spontanous Breathing and Patient connected to nasal cannula oxygen  Post-op Assessment: Report given to PACU RN  Post vital signs: Reviewed  Complications: No apparent anesthesia complications

## 2011-07-25 NOTE — Op Note (Signed)
07/25/2011  11:24 AM  PATIENT:  Mackenzie [name]  36 y.o. female  PRE-OPERATIVE DIAGNOSIS:  Right multicystic ovarian cyst, pelvic pain, spinal tubing seen on pelvic CT scan  POST-OPERATIVE DIAGNOSIS:  same; tubo-ovarian adhesions; right and left paratubal cysts; moderate pelvic endometrosis; Endosalpingiosis with left partial distal occlusion and right proximal occlusion.  PROCEDURE:  Procedure(s): ROBOTIC ASSISTED LAPAROSCOPIC RIGHT OVARIAN CYSTECTOMIES, BILATERAL PARATUBAL CYSTS REMOVAL, LYSIS OF TUBO-OVARIAN ADHESIONS, LEFT URETEROLYSIS, METHYLENE BLUE CHROMOPERTUBATION, LEFT DISTAL FIMBRIOPLASTY, REMOVAL OF SPINAL TUBING.  SURGEON:  Surgeon(s): Genia Del, MD Alphonsus Sias. Ernestina Penna, MD  ASSISTANTS: Dr Alphonsus Sias. Fogleman   ANESTHESIA:   general  PROCEDURE:  Under general anesthesia the patient is in lithotomy position. She is prepped with ChloraPrep on the abdomen and with Betadine on the suprapubic, the vulvar and vaginal areas. The vaginal exam reveals an anteverted uterus normal volume, mobile, normal left adnexa and soft enlargement of the right adnexa.  A weighted speculum is inserted in the vagina, the anterior lip of the cervix is grasped with a tenaculum and dilatation of the cervix is done with Hegar dilators.  The hysterometry is at 9 cm a number 8 roomy is used with a medium Koe ring.  The tenaculum and weighted speculum are removed.  Abdominally we make a 1.2 cm incision at the supraumbilical area after infiltration of Marcaine one quarter plain.  The aponeurosis is opened with Mayo scissors and the peritoneum is opened bluntly with a finger.  A pursestring stitch is done with a Vicryl 0 at the aponeurosis. The Roseanne Reno is inserted, a pneumoperitoneum is created with CO2 and the camera is inserted at that level.  No adhesions are in the way of at the level of the anterior abdominal wall for port placement. A semicircular configuration is used. 2 robotic ports on the right and  one robotic ports on the left, with the the 5 mm assistant port on the upper left.  All ports are inserted under direct vision. The robot his docked easily from the right side. The instruments are inserted under direct vision using the Endo Shears scissor the PK and the fenestrated Prograsp.  A long right spinal tube was present in the pelvis as expected from a pelvic CT scan.  This tube ring was just barely adherent anteriorly at the lower uterine segment. This was easily freed. And the tubing was removed.  Moderate pelvic endometriosis is diagnosed.  Multiple adhesions of endometriosis which are blue and black are present in the posterior to the sac and at the level of the ovarian fossae as well as on the right ovary. No fimbriated are visible at the level of the left tube which is adherent to the ovarian fossa and the right distal tube is adherent to the right ovary.   A right ovarian cyst is present, multiple paratubal cysts bilaterally and bilateral tubo-ovarian adhesions.  All paratubal cysts are removed, the right ovarian cyst is removed. All specimens are sent to pathology. We also of proceed with lysis of adhesion of the tubo-ovarian adhesions bilaterally. On the left side the ovary is adherent very close to the left ureter and that is lysed without any trauma. We then proceeded with on Methylene Blue chromotubation.  The roomy and Koe ring were replaced by an acorn to make sure that the test was satisfying.  That revealed endosalpingiosis at least on the right side with proximal obstruction of the tube. On the left side the dye reaches the distal part of the tube  and a partial occlusion is present as very little dye is seen evacuating at the distal tube.  A distal left fimbrioplasty is done successfully opening of the distal tube more frankly but we were hoping to expose fimbriae while doing so but unfortunately that was not the case.  Hemostasis was adequate at all levels.  The abdominopelvic cavities  were irrigated and suctioned.  All robotic instruments were removed. The robot was undocked.  Instruments were removed vaginally. The pursestring stitch was attached at the supraumbilical incision. Hemostasis was completed with the electrocautery on some incisions. A separate stitch was done with Vicryl 0 at the supraumbilical incision at the level of the adipose tissue.  All incisions were closed with a subcuticular stitch of Vicryl 4-0. Dermabond was added on all incisions.  The patient was brought to recovery room in good and stable status.  ESTIMATED BLOOD LOSS: 50 cc   Intake/Output Summary (Last 24 hours) at 07/25/11 1124 Last data filed at 07/25/11 1110  Gross per 24 hour  Intake   1100 ml  Output    550 ml  Net    550 ml     BLOOD ADMINISTERED:none   LOCAL MEDICATIONS USED:  MARCAINE     SPECIMEN:  Source of Specimen:  Right ovarian cyst wall and bilateral paratubal cysts  DISPOSITION OF SPECIMEN:  PATHOLOGY  COUNTS:  YES  PLAN OF CARE: Transfer to PACU    Dr Genia Del   07/25/2011 at 11:30 am

## 2011-07-25 NOTE — Preoperative (Signed)
Beta Blockers   Reason not to administer Beta Blockers:Not Applicable 

## 2011-07-25 NOTE — H&P (Signed)
Mackenzie [name] is an 36 y.o. female G0 Blind  RP:  Rt multicystic ovarian cyst with Rt pelvic pain.  Pertinent Gynecological History: Menses: flow is moderate Bleeding: none Contraception: abstinence Blood transfusions: none Sexually transmitted diseases: no past history Previous GYN Procedures: none  Last mammogram: N/A  Last pap: normal OB History:  G0   Menstrual History:  Patient's last menstrual period was 07/14/2011.    Past Medical History  Diagnosis Date  . Breast lump left  . Blind 1994  . Blood transfusion 04/2010    cancer center at [facility] - iron transfusion per pt  . Pseudotumor cerebri 1994    History - caused blind  . 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 Anemia     history  . H/O hiatal hernia     repaired with weight loss surgery  . Depression     no meds    Past Surgical History  Procedure Date  . Eye decompression x3 1994    Bilateral   . Lp shunt 1994    removed in 2009  . Rurod switch 2007    weight loss surgery done in [place] (duodenal switch)  . Fetal blood transfusion jan 29,2012  . Breast surgery 2012    right breast lumpectomy  . Appendectomy     removed with switch surgery  . Cholecystectomy     removed with switch surgery    Family History  Problem Relation Age of Onset  . Cancer Maternal Aunt     breast-passed in 2012    Social History:  reports that she has been smoking Cigarettes.  She has a 3 pack-year smoking history. She has never used smokeless tobacco. She reports that she drinks alcohol. She reports that she does not use illicit drugs.  Allergies: No Known Allergies  Prescriptions prior to admission  Medication Sig Dispense Refill  . ferrous gluconate (FERGON) 325 MG tablet Take 650 mg by mouth 2 (two) times daily.       . Multiple Vitamin (MULTIVITAMIN) capsule Take 1 capsule by mouth daily. [name] [name]       . naproxen sodium (ANAPROX) 220 MG tablet Take 440 mg by mouth as needed. For Pain.      Marland Kitchen OVER THE COUNTER MEDICATION Take 1 tablet by mouth daily. [name]         @ROS @  Blood pressure 120/78, pulse 76, temperature 98.2 F (36.8 C), temperature source Oral, resp. rate 18, last menstrual period 07/14/2011, SpO2 99.00%.  Pelvic US:  Rt multicystic ovarian cyst 7+ cm  Results for orders placed during the hospital encounter of 07/25/11 (from the past 24 hour(s))  TYPE AND SCREEN     Status: Normal   Collection Time   07/25/11  6:25 AM      Component Value Range   ABO/RH(D) O POS     Antibody Screen NEG     Sample Expiration 07/28/2011    PREGNANCY, URINE     Status: Normal   Collection Time   07/25/11  6:27 AM      Component Value Range   Preg Test, Ur NEGATIVE  NEGATIVE     No results found.  Assessment/Plan: Rt multicystic ovarian cyst 7+ cm with Rt pelvic pain for Rt ovarian cystectomy with da Vinci, possible Rt Oophorectomy.  Surgery and risks reviewed.  Powell Halbert,MARIE-LYNE 07/25/2011, 7:38 AM

## 2012-12-31 ENCOUNTER — Telehealth: Payer: Self-pay

## 2012-12-31 NOTE — Telephone Encounter (Signed)
Left pt vm in ref to np appt. °

## 2013-01-04 ENCOUNTER — Telehealth: Payer: Self-pay

## 2013-01-04 NOTE — Telephone Encounter (Signed)
S/W PT IN  REF TO NP APPT. 01/27/13@10 :30 REFERRING DR Payton Doughty MAILED NP PACKET

## 2013-01-05 ENCOUNTER — Telehealth: Payer: Self-pay | Admitting: Internal Medicine

## 2013-01-05 NOTE — Telephone Encounter (Signed)
C/D 01/05/13 for appt. 01/27/13

## 2013-01-26 ENCOUNTER — Other Ambulatory Visit: Payer: Self-pay | Admitting: Internal Medicine

## 2013-01-26 DIAGNOSIS — D649 Anemia, unspecified: Secondary | ICD-10-CM

## 2013-01-27 ENCOUNTER — Other Ambulatory Visit: Payer: Self-pay | Admitting: Internal Medicine

## 2013-01-27 ENCOUNTER — Ambulatory Visit: Payer: Medicare HMO

## 2013-01-27 ENCOUNTER — Encounter: Payer: Self-pay | Admitting: Internal Medicine

## 2013-01-27 ENCOUNTER — Ambulatory Visit (HOSPITAL_BASED_OUTPATIENT_CLINIC_OR_DEPARTMENT_OTHER): Payer: Medicare PPO | Admitting: Lab

## 2013-01-27 ENCOUNTER — Telehealth: Payer: Self-pay | Admitting: Internal Medicine

## 2013-01-27 ENCOUNTER — Ambulatory Visit (HOSPITAL_BASED_OUTPATIENT_CLINIC_OR_DEPARTMENT_OTHER): Payer: Medicare PPO | Admitting: Internal Medicine

## 2013-01-27 ENCOUNTER — Other Ambulatory Visit: Payer: Medicare PPO | Admitting: Lab

## 2013-01-27 VITALS — BP 121/70 | HR 78 | Temp 98.2°F | Resp 18 | Ht 67.0 in | Wt 200.9 lb

## 2013-01-27 DIAGNOSIS — D649 Anemia, unspecified: Secondary | ICD-10-CM

## 2013-01-27 DIAGNOSIS — D509 Iron deficiency anemia, unspecified: Secondary | ICD-10-CM

## 2013-01-27 LAB — COMPREHENSIVE METABOLIC PANEL (CC13)
ALT: 14 U/L (ref 0–55)
AST: 21 U/L (ref 5–34)
Albumin: 3.8 g/dL (ref 3.5–5.0)
Alkaline Phosphatase: 66 U/L (ref 40–150)
BUN: 10.1 mg/dL (ref 7.0–26.0)
CO2: 24 mEq/L (ref 22–29)
Calcium: 8.6 mg/dL (ref 8.4–10.4)
Chloride: 108 mEq/L (ref 98–109)
Creatinine: 0.7 mg/dL (ref 0.6–1.1)
Glucose: 80 mg/dl (ref 70–140)
Potassium: 5 mEq/L (ref 3.5–5.1)
Sodium: 138 mEq/L (ref 136–145)
Total Bilirubin: 0.48 mg/dL (ref 0.20–1.20)
Total Protein: 7.3 g/dL (ref 6.4–8.3)

## 2013-01-27 LAB — CBC WITH DIFFERENTIAL/PLATELET
BASO%: 0.2 % (ref 0.0–2.0)
Basophils Absolute: 0 10*3/uL (ref 0.0–0.1)
EOS%: 0.8 % (ref 0.0–7.0)
Eosinophils Absolute: 0.1 10*3/uL (ref 0.0–0.5)
HCT: 33.8 % — ABNORMAL LOW (ref 34.8–46.6)
HGB: 10.8 g/dL — ABNORMAL LOW (ref 11.6–15.9)
LYMPH%: 15.1 % (ref 14.0–49.7)
MCH: 28.1 pg (ref 25.1–34.0)
MCHC: 31.9 g/dL (ref 31.5–36.0)
MCV: 88 fL (ref 79.5–101.0)
MONO#: 0.7 10*3/uL (ref 0.1–0.9)
MONO%: 6.4 % (ref 0.0–14.0)
NEUT#: 8.5 10*3/uL — ABNORMAL HIGH (ref 1.5–6.5)
NEUT%: 77.5 % — ABNORMAL HIGH (ref 38.4–76.8)
Platelets: 205 10*3/uL (ref 145–400)
RBC: 3.85 10*6/uL (ref 3.70–5.45)
RDW: 16.8 % — ABNORMAL HIGH (ref 11.2–14.5)
WBC: 10.9 10*3/uL — ABNORMAL HIGH (ref 3.9–10.3)
lymph#: 1.7 10*3/uL (ref 0.9–3.3)

## 2013-01-27 LAB — IRON AND TIBC CHCC
%SAT: 9 % — ABNORMAL LOW (ref 21–57)
Iron: 38 ug/dL — ABNORMAL LOW (ref 41–142)
TIBC: 411 ug/dL (ref 236–444)
UIBC: 373 ug/dL (ref 120–384)

## 2013-01-27 LAB — FERRITIN CHCC: Ferritin: 10 ng/ml (ref 9–269)

## 2013-01-27 LAB — CHCC SMEAR

## 2013-01-27 NOTE — Progress Notes (Signed)
Checked in new pt with no financial concerns. °

## 2013-01-27 NOTE — Patient Instructions (Addendum)
Gastric Bypass Surgery Care After Refer to this sheet in the next few weeks. These discharge instructions provide you with general information on caring for yourself after you leave the hospital. Your caregiver may also give you specific instructions. Your treatment has been planned according to the most current medical practices available, but unavoidable complications sometimes occur. If you have any problems or questions after discharge, call your caregiver. HOME CARE INSTRUCTIONS  Activity  Take frequent walks throughout the day. This will help to prevent blood clots. Do not sit for longer than 45 minutes to 1 hour while awake for 4 to 6 weeks after surgery.  Continue to do coughing and deep breathing exercises once you get home. This will help to prevent pneumonia.  Do not do strenuous activities, such as heavy lifting, pushing, or pulling, until after your follow-up visit with your caregiver. Do not lift anything heavier than 10 lb (4.5 kg).  Talk with your caregiver about when you may return to work and your exercise routine.  Do not drive while taking prescription pain medicine. Nutrition  It is very important that you drink at least 80 oz (2,400 mL) of fluid a day.  You should stay on a clear liquid diet until your follow-up visit with your caregiver. Keep sugar-free, clear liquid items on hand, including:  Tea: hot or cold. Drink only decaffeinated for the first month.  Broths: clear beef, chicken, vegetable.  Others: water, sugar-free frozen ice pops, flavored water, gelatin (after 1 week).  Do not consume caffeine for 1 month. Large amounts of caffeine can cause dehydration.  A dietician may also give you specific instructions.  Follow your caregiver's recommendations about vitamins and protein requirements after surgery. Hygiene  You may shower and wash your hair 2 days after surgery. Pat incisions dry. Do not rub incisions with a washcloth or towel.  Follow your  caregiver's recommendations about baths and pools following surgery. Pain control  If a prescription medicine was given, follow your caregiver's directions.  You may feel some gas pain caused by the carbon dioxide used to inflate your abdomen during surgery. This pain can be felt in your chest, shoulder, back, or abdominal area. Moving around often is advised. Incision care  You may have 4 or more small incisions. They are closed with skin adhesive strips. Skin adhesive strips can get wet and will fall off on their own. Check your incisions and surrounding area daily for any redness, swelling, discoloration, fluid (drainage), or bleeding. Dark red, dried blood may appear under these coverings. This is normal.  If you have a drain, it will be removed at your follow-up visit or before you leave the hospital.  If your drain is left in, follow your caregiver's instructions on drain care.  If your drain is taken out, keep a clean, dry bandage over the drain site. SEEK MEDICAL CARE IF:   You develop persistent nausea and vomiting.  You have pain and discomfort with swallowing.  You have pain, swelling, or warmth in the lower extremities.  You have an oral temperature above 102 F (38.9 C).  You develop chills.  Your incision sites look red, swollen, or have drainage.  Your stool is black, tarry, or maroon in color.  You are lightheaded when standing.  You notice a bruise getting larger.  You have any questions or concerns. SEEK IMMEDIATE MEDICAL CARE IF:   You have chest pain.  You have severe calf pain or pain not relieved by medicine.  You   develop shortness of breath or difficulty breathing.  There is bright red blood coming from the drain.  You feel confused.  You have slurred speech.  You suddenly feel weak. MAKE SURE YOU:   Understand these instructions.  Will watch your condition.  Will get help right away if you are not doing well or get worse. Document  Released: 11/28/2003 Document Revised: 07/08/2011 Document Reviewed: 09/05/2009 [facility] Patient Information 2014 [place], [place]. Iron Deficiency Anemia There are many types of anemia. Iron deficiency anemia is the most common. Iron deficiency anemia is a decrease in the number of red blood cells caused by too little iron. Without enough iron, your body does not produce enough hemoglobin. Hemoglobin is a substance in red blood cells that carries oxygen to the body's tissues. Iron deficiency anemia may leave you tired and short of breath. CAUSES   Lack of iron in the diet.  This may be seen in infants and children, because there is little iron in milk.  This may be seen in adults who do not eat enough iron-rich foods.  This may be seen in pregnant or breastfeeding women who do not take iron supplements. There is a much higher need for iron intake at these times.  Poor absorption of iron, as seen with intestinal disorders.  Intestinal bleeding.  Heavy periods. SYMPTOMS  Mild anemia may not be noticeable. Symptoms may include:  Fatigue.  Headache.  Pale skin.  Weakness.  Shortness of breath.  Dizziness.  Cold hands and feet.  Fast or irregular heartbeat. DIAGNOSIS  Diagnosis requires a thorough evaluation and physical exam by your caregiver.  Blood tests are generally used to confirm iron deficiency anemia.  Additional tests may be done to find the underlying cause of your anemia. These may include:  Testing for blood in the stool (fecal occult blood test).  A procedure to see inside the colon and rectum (colonoscopy).  A procedure to see inside the esophagus and stomach (endoscopy). TREATMENT   Correcting the cause of the iron deficiency is the first step.  Medicines, such as oral contraceptives, can make heavy menstrual flows lighter.  Antibiotics and other medicines can be used to treat peptic ulcers.  Surgery may be needed to remove a bleeding polyp, tumor,  or fibroid.  Often, iron supplements (ferrous sulfate) are taken.  For the best iron absorption, take these supplements with an empty stomach.  You may need to take the supplements with food if you cannot tolerate them on an empty stomach. Vitamin C improves the absorption of iron. Your caregiver may recommend taking your iron tablets with a glass of orange juice or vitamin C supplement.  Milk and antacids should not be taken at the same time as iron supplements. They may interfere with the absorption of iron.  Iron supplements can cause constipation. A stool softener is often recommended.  Pregnant and breastfeeding women will need to take extra iron, because their normal diet usually will not provide the required amount.  Patients who cannot tolerate iron by mouth can take it through a vein (intravenously) or by an injection into the muscle. HOME CARE INSTRUCTIONS   Ask your dietitian for help with diet questions.  Take iron and vitamins as directed by your caregiver.  Eat a diet rich in iron. Eat liver, lean beef, whole-grain bread, eggs, dried fruit, and dark green leafy vegetables. SEEK IMMEDIATE MEDICAL CARE IF:   You have a fainting episode. Do not drive yourself. Call your local emergency services (911  in [place] if no other help is available.  You have chest pain, nausea, or vomiting.  You develop severe or increased shortness of breath with activities.  You develop weakness or increased thirst.  You have a rapid heartbeat.  You develop unexplained sweating or become lightheaded when getting up from a chair or bed. MAKE SURE YOU:   Understand these instructions.  Will watch your condition.  Will get help right away if you are not doing well or get worse. Document Released: 04/12/2000 Document Revised: 07/08/2011 Document Reviewed: 08/22/2009 [facility] Patient Information 2014 [place], [place].

## 2013-01-27 NOTE — Telephone Encounter (Signed)
Gave pt appt for lab and Md for January 2015 °

## 2013-01-27 NOTE — Progress Notes (Signed)
[facility] [phone]   [phone]  NEW PATIENT EVALUATION   Name: Mackenzie [name] Date: 01/27/2013 MRN: [id number] DOB: January 03, 1976  PCP: Lorenda Peck, MD   REFERRING PHYSICIAN: Burton Apley, MD  REASON FOR REFERRAL: Iron deficiency anemia.   HISTORY OF PRESENT ILLNESS:Mackenzie [name] is a 37 y.o. female who presents along with her mother for further evaluation of her iron deficiency anemia. He reports seeing Dr. Dalene Carrow in the past.  She also reports receiving intravenous iron infusions in the past.  For her anemia, she reports increasing fatigue over the past six months.  She also notes a longstanding history of picca including eating ice and starch.  She denies chest pain, palpitations or acute shortness of breath.  She also denies hematochezia or melena.   Her last menstrual period was the week of September the 6th.  She describes a heavy flow which requires 4-7 diapers per day for at least 5 days.  She notes improvement in regularity and menorrhagia since she started taking her oral contraceptive pills.  She recalls a history of fibroids but ultrasound (as noted below) does not demonstrate uterine fibroids.   In addition, she also reports a history of pseudotumor cerebri which developed in 1994 attributed to a viral illness, at which time she also had Guillain-Barre.This resulted in  Blindness and also required a ventriculoperitoneal shunt which was removed. She also had a hiistory of morbid obesity and had attempted to had bariatric surgery in [place] by Dr.  Hortense Ramal. Medical records indicate she had a duodenal switch procedure done in 2007. Her anemia, she reports,  has worsened since her gastric bypass surgery.   PAST MEDICAL HISTORY:  has a past medical history of Breast lump (left); Blind (1994); Blood transfusion (04/2010); Pseudotumor cerebri (1994); Neuromuscular disorder; Anemia; H/O hiatal hernia; and Depression.      PAST SURGICAL HISTORY: Past Surgical History  Procedure Laterality Date  . Eye decompression x3  1994    Bilateral   . Lp shunt  1994    removed in 2009  . Rurod switch  2007    weight loss surgery done in [place] (duodenal switch)  . Fetal blood transfusion  jan 29,2012  . Breast surgery  2012    right breast lumpectomy  . Appendectomy      removed with switch surgery  . Cholecystectomy      removed with switch surgery     CURRENT MEDICATIONS: has a current medication list which includes the following prescription(s): calcium carbonate, multivitamin, naproxen sodium, and ferrous gluconate.   ALLERGIES: Review of patient's allergies indicates no known allergies.   SOCIAL HISTORY:  reports that she has been smoking Cigarettes.  She has a 3 pack-year smoking history. She has never used smokeless tobacco. She reports that  drinks alcohol. She reports that she does not use illicit drugs.   FAMILY HISTORY: family history includes Cancer in her maternal aunt.   LABORATORY DATA:   CBC    Component Value Date/Time   WBC 10.9* 01/27/2013 1153   WBC 11.3* 07/11/2011 1418   RBC 3.85 01/27/2013 1153   RBC 3.66* 07/11/2011 1418   HGB 10.8* 01/27/2013 1153   HGB 12.0 07/11/2011 1418   HCT 33.8* 01/27/2013 1153   HCT 37.2 07/11/2011 1418   PLT 205 01/27/2013 1153   PLT 154 07/11/2011 1418   MCV 88.0 01/27/2013 1153   MCV 101.6* 07/11/2011 1418   MCH 28.1 01/27/2013 1153  MCH 32.8 07/11/2011 1418   MCHC 31.9 01/27/2013 1153   MCHC 32.3 07/11/2011 1418   RDW 16.8* 01/27/2013 1153   RDW 12.9 07/11/2011 1418   LYMPHSABS 1.7 01/27/2013 1153   LYMPHSABS 2.0 02/09/2010 0947   MONOABS 0.7 01/27/2013 1153   MONOABS 0.6 02/09/2010 0947   EOSABS 0.1 01/27/2013 1153   EOSABS 0.0 02/09/2010 0947   BASOSABS 0.0 01/27/2013 1153   BASOSABS 0.1 02/09/2010 0947    CMP     Component Value Date/Time   NA 138 01/27/2013 1153   NA 136 07/11/2011 1418   K 5.0 01/27/2013 1153   K 3.9 07/11/2011 1418    CL 104 07/11/2011 1418   CO2 24 01/27/2013 1153   CO2 25 07/11/2011 1418   GLUCOSE 80 01/27/2013 1153   GLUCOSE 82 07/11/2011 1418   BUN 10.1 01/27/2013 1153   BUN 9 07/11/2011 1418   CREATININE 0.7 01/27/2013 1153   CREATININE 0.67 07/11/2011 1418   CALCIUM 8.6 01/27/2013 1153   CALCIUM 9.1 07/11/2011 1418   CALCIUM 9.0 10/01/2007 2321   PROT 7.3 01/27/2013 1153   PROT 7.4 07/11/2011 1418   ALBUMIN 3.8 01/27/2013 1153   ALBUMIN 4.2 07/11/2011 1418   AST 21 01/27/2013 1153   AST 21 07/11/2011 1418   ALT 14 01/27/2013 1153   ALT 19 07/11/2011 1418   ALKPHOS 66 01/27/2013 1153   ALKPHOS 43 07/11/2011 1418   BILITOT 0.48 01/27/2013 1153   BILITOT 0.6 07/11/2011 1418   GFRNONAA >90 07/11/2011 1418   GFRAA >90 07/11/2011 1418   Iron/TIBC/Ferritin    Component Value Date/Time   IRON 38* 01/27/2013 1153   IRON 40* 12/25/2010 1411   TIBC 411 01/27/2013 1153   TIBC 254 12/25/2010 1411   FERRITIN 10 01/27/2013 1153   FERRITIN 112 12/25/2010 1411     Results for HOLLE, SPRICK (MRN [id number]) as of 01/27/2013 11:46  Ref. Range 08/26/2006 13:00 10/01/2007 00:00 06/17/2008 11:17 08/02/2008 14:39 10/17/2010 10:24 12/25/2010 14:11  Ferritin Latest Range: 10-291 ng/mL 7.9 (L) 6.2 (L) 4 (L) 118 7 (L) 112     Ref. Range 08/26/2006 13:00 01/09/2007 16:48 06/17/2008 11:17 08/02/2008 14:39 10/17/2010 10:24 12/25/2010 14:11  Iron Latest Range: 42-145 ug/dL 27 (L) 26 (L) <62 (L) 52 12 (L) 40 (L)  UIBC Latest Range: 125-400 ug/dL   952 841 324 401  TIBC Latest Range: 250-470 ug/dL   NOT CALC 027 253 664  %SAT Latest Range: 20-55 %   NOT CALC 19 (L) 3 (L) 16 (L)  Saturation Ratios Latest Range: 20.0-50.0 % 7.0 (L) 6.5 (L)        RADIOGRAPHY: TRANSABDOMINAL AND TRANSVAGINAL ULTRASOUND OF PELVIS 09/24/2007 Technique: Both transabdominal and transvaginal ultrasound  examinations of the pelvis were performed including evaluation of  the uterus, ovaries, adnexal regions, and pelvic cul-de-sac.  Comparison: 01/22/2007  Findings:  The uterus is retroverted and anteflexed, causing in  orientation which extends directly awaiting the transducer on  transvaginal imaging. Uterine length is measured along the  endometrium is 7.6 cm. No uterine mass is identified.  Endometrial thickness is difficult to measure due to the  orientation of the uterus, but appears to be 11 mm.  There is a small but abnormal amount of free pelvic fluid. The  right ovary measures 5.1 x 3.1 x 2.8 cm and the left ovary measures 5.1 x 2.1 x 3.4 cm.  A complex lesion of the right ovary measuring 1.7 x 1.2 x 1.2 cm is present.  This could represent a hemorrhagic cyst but is  technically nonspecific. It may simply represent a corpus luteum.  Several left ovarian cysts are present. One of these appears to  have a small internal septations or color cysts along its margin,  and a mildly corrugated border raising the possibility of a  ruptured cyst. This measures this measures 2.1 x 1.0 x 1.6 cm. A  second simple cyst measures 2.2 x 1.2 x 1.7 cm.  IMPRESSION:  1. Small but abnormal amount of free pelvic fluid.  2. Left-sided ovarian cysts, one of which appears to have a small  marginal cysts or septation as well as a corrugated border raising  the possibility of a ruptured cyst. The second cyst appears  simple.  3. More complex appearing lesion in the right ovary probably  represents a corpus luteum.  02/04/2007 Clinical Data: Ascites and abdominal pain. History of gastric bypass and lumbar peritoneal shunt.  DIAGNOSTIC UPPER GI SERIES WITH HIGH DENSITY WITH KUB - 02/04/07:  Comparison: Abdominal CT 01/14/07 and preoperative upper GI series 01/03/05.  Findings: Scout abdominal radiographs demonstrate central displacement of bowel loops compatible with ascites. The lumbar peritoneal shunt catheter appears stable compared with the CT. The tip of the catheter is in the pelvis. There is a separate catheter fragment in the pelvis.  The esophagus, stomach, and  duodenum were studied in signal and double contrast. The esophageal motility is normal. There is no stricture, mass, or ulceration. The gastroesophageal junction is widely patent. Mild gastroesophageal reflux was noted with the water siphon test. At the conclusion of the study, a 13 mm barium tablet was administered and this passed without delay into the stomach.  The patient has undergone interval partial gastrectomy and gastric bypass. The gastric remnant demonstrates no mucosal abnormality. There is rapid emptying into the small bowel. No extravasation is demonstrated.  IMPRESSION:  1. Expected appearance status post gastric bypass. No evidence of gastric outlet obstruction or extravasation.  2. Mild gastroesophageal reflux.  3. Known abdominal ascites as demonstrated on prior CT.  REVIEW OF SYSTEMS:  Constitutional: Denies fevers, chills or abnormal weight loss Eyes: Denies blurriness of vision Ears, nose, mouth, throat, and face: Denies mucositis or sore throat Respiratory: Denies cough, dyspnea or wheezes Cardiovascular: Denies palpitation, chest discomfort or lower extremity swelling Gastrointestinal:  Denies nausea, heartburn or change in bowel habits Skin: Denies abnormal skin rashes Lymphatics: Denies new lymphadenopathy or easy bruising Neurological:Denies numbness, tingling or new weaknesses Behavioral/Psych: Mood is stable, no new changes  All other systems were reviewed with the patient and are negative.  PHYSICAL EXAM:  height is 5\' 7"  (1.702 m) and weight is 200 lb 14.4 oz (91.128 kg). Her oral temperature is 98.2 F (36.8 C). Her blood pressure is 121/70 and her pulse is 78. Her respiration is 18 and oxygen saturation is 100%.    GENERAL:alert, no distress and comfortable, alopecia and mildly obese SKIN: skin color, texture, turgor are normal, no rashes or significant lesions EYES: Blindness, Conjunctiva are pink and non-injected, sclera clear OROPHARYNX:no exudate, no  erythema and lips, buccal mucosa, and tongue normal  NECK: supple, thyroid normal size, non-tender, without nodularity LYMPH:  no palpable lymphadenopathy in the cervical, axillary or inguinal LUNGS: clear to auscultation and percussion with normal breathing effort HEART: regular rate & rhythm and no murmurs and no lower extremity edema ABDOMEN:abdomen soft, non-tender and normal bowel sounds Musculoskeletal:no cyanosis of digits and no clubbing  NEURO: alert & oriented x 3 with fluent  speech, no focal motor/sensory deficits   IMPRESSION: Mackenzie [name] is a 37 y.o. female with a history of iron- defiency anemia who is symptomatic with fatigue and picca (eating flour at every opportunity) presents for further evaluation and consideration of iron infusion.   1. Iron-defiency anemia based her labs taken today. Differential includes secondary to Roux-en-Y (DeFilipp et al, Surg Obes Relat Dis, 2013, Jan-Feb; 9(1): 129-132 and/or anemia secondary to menstruation which  is more common in younger women. Anemia secondary to GI causes are felt to be less likely given her age  -- Latest labs are as follows: Hbg, 10.8, RDWof 16.8. We performed an iron-deficiency anemia w/u including ferritin, TIBC, CBC with differential and smear, retic count. (as noted above) -- Given her ferritin is 10, we will replete with intravenous ferumoxytol 1,020 mg IV x 1 recheck CBC and ferritin in 6 weeks. DeFlipp et. Al, 2013 demonstrated IV iron replacement for persistent iron deficiency anemia after Roux-en-Y-gastric bypass was the preferred approach.   2. RTC in 3 months for repeat labs. If iron deficient, arrange for iron infusion.  She was provided a handout on iron defiency anemia.  She was counseled extensively on the symptoms of anemia.    All questions were answered. The patient knows to call the clinic with any problems, questions or concerns. We can certainly see the patient much sooner if necessary.  I spent 25  minutes counseling the patient face to face. The total time spent in the appointment was 45 minutes.    Pace Lamadrid, MD 01/27/2013 11:46 AM

## 2013-01-28 ENCOUNTER — Other Ambulatory Visit: Payer: Self-pay | Admitting: Internal Medicine

## 2013-02-01 ENCOUNTER — Telehealth: Payer: Self-pay | Admitting: *Deleted

## 2013-02-01 ENCOUNTER — Telehealth: Payer: Self-pay | Admitting: Internal Medicine

## 2013-02-01 NOTE — Telephone Encounter (Signed)
Per staff message and POF I have scheduled appts.  JMW  

## 2013-02-01 NOTE — Telephone Encounter (Signed)
sw. pt and advised on 10.10.14 tx and pt already aware of other appts.

## 2013-02-05 ENCOUNTER — Ambulatory Visit (HOSPITAL_BASED_OUTPATIENT_CLINIC_OR_DEPARTMENT_OTHER): Payer: Medicare PPO

## 2013-02-05 ENCOUNTER — Other Ambulatory Visit: Payer: Self-pay | Admitting: Internal Medicine

## 2013-02-05 VITALS — BP 102/67 | HR 78 | Temp 99.4°F | Resp 20

## 2013-02-05 DIAGNOSIS — D509 Iron deficiency anemia, unspecified: Secondary | ICD-10-CM

## 2013-02-05 MED ORDER — FERUMOXYTOL INJECTION 510 MG/17 ML
510.0000 mg | Freq: Once | INTRAVENOUS | Status: AC
  Administered 2013-02-05: 510 mg via INTRAVENOUS
  Filled 2013-02-05: qty 17

## 2013-02-05 MED ORDER — SODIUM CHLORIDE 0.9 % IJ SOLN
10.0000 mL | INTRAMUSCULAR | Status: DC | PRN
  Filled 2013-02-05: qty 10

## 2013-02-05 NOTE — Patient Instructions (Signed)
Ferumoxytol injection What is this medicine? FERUMOXYTOL is an iron complex. Iron is used to make healthy red blood cells, which carry oxygen and nutrients throughout the body. This medicine is used to treat iron deficiency anemia in people with chronic kidney disease. This medicine may be used for other purposes; ask your health care provider or pharmacist if you have questions. What should I tell my health care provider before I take this medicine? They need to know if you have any of these conditions: -anemia not caused by low iron levels -high levels of iron in the blood -magnetic resonance imaging (MRI) test scheduled -an unusual or allergic reaction to iron, other medicines, foods, dyes, or preservatives -pregnant or trying to get pregnant -breast-feeding How should I use this medicine? This medicine is for infusion into a vein. It is given by a health care professional in a hospital or clinic setting. Talk to your pediatrician regarding the use of this medicine in children. Special care may be needed. Overdosage: If you think you've taken too much of this medicine contact a poison control center or emergency room at once. Overdosage: If you think you have taken too much of this medicine contact a poison control center or emergency room at once. NOTE: This medicine is only for you. Do not share this medicine with others. What if I miss a dose? It is important not to miss your dose. Call your doctor or health care professional if you are unable to keep an appointment. What may interact with this medicine? This medicine may interact with the following medications: -other iron products This list may not describe all possible interactions. Give your health care provider a list of all the medicines, herbs, non-prescription drugs, or dietary supplements you use. Also tell them if you smoke, drink alcohol, or use illegal drugs. Some items may interact with your medicine. What should I watch  for while using this medicine? Visit your doctor or healthcare professional regularly. Tell your doctor or healthcare professional if your symptoms do not start to get better or if they get worse. You may need blood work done while you are taking this medicine. You may need to follow a special diet. Talk to your doctor. Foods that contain iron include: whole grains/cereals, dried fruits, beans, or peas, leafy green vegetables, and organ meats (liver, kidney). What side effects may I notice from receiving this medicine? Side effects that you should report to your doctor or health care professional as soon as possible: -allergic reactions like skin rash, itching or hives, swelling of the face, lips, or tongue -breathing problems -changes in blood pressure -feeling faint or lightheaded, falls -fever or chills -flushing, sweating, or hot feelings -swelling of the ankles or feet Side effects that usually do not require medical attention (Report these to your doctor or health care professional if they continue or are bothersome.): -diarrhea -headache -nausea, vomiting -stomach pain This list may not describe all possible side effects. Call your doctor for medical advice about side effects. You may report side effects to FDA at 1-800-FDA-1088. Where should I keep my medicine? This drug is given in a hospital or clinic and will not be stored at home. NOTE: This sheet is a summary. It may not cover all possible information. If you have questions about this medicine, talk to your doctor, pharmacist, or health care provider.  2012, [employer]/Gold Standard. (01/06/2008 9:48:25 PM) 

## 2013-03-10 ENCOUNTER — Other Ambulatory Visit (HOSPITAL_BASED_OUTPATIENT_CLINIC_OR_DEPARTMENT_OTHER): Payer: Medicare PPO

## 2013-03-10 DIAGNOSIS — D509 Iron deficiency anemia, unspecified: Secondary | ICD-10-CM

## 2013-03-10 DIAGNOSIS — D649 Anemia, unspecified: Secondary | ICD-10-CM

## 2013-03-10 LAB — COMPREHENSIVE METABOLIC PANEL (CC13)
ALT: 17 U/L (ref 0–55)
AST: 24 U/L (ref 5–34)
Albumin: 3.9 g/dL (ref 3.5–5.0)
Alkaline Phosphatase: 62 U/L (ref 40–150)
Anion Gap: 8 mEq/L (ref 3–11)
BUN: 11.9 mg/dL (ref 7.0–26.0)
CO2: 25 mEq/L (ref 22–29)
Calcium: 8.7 mg/dL (ref 8.4–10.4)
Chloride: 106 mEq/L (ref 98–109)
Creatinine: 0.7 mg/dL (ref 0.6–1.1)
Glucose: 85 mg/dl (ref 70–140)
Potassium: 3.8 mEq/L (ref 3.5–5.1)
Sodium: 139 mEq/L (ref 136–145)
Total Bilirubin: 0.46 mg/dL (ref 0.20–1.20)
Total Protein: 7.3 g/dL (ref 6.4–8.3)

## 2013-03-10 LAB — CBC WITH DIFFERENTIAL/PLATELET
BASO%: 0.3 % (ref 0.0–2.0)
Basophils Absolute: 0 10*3/uL (ref 0.0–0.1)
EOS%: 0.3 % (ref 0.0–7.0)
Eosinophils Absolute: 0 10*3/uL (ref 0.0–0.5)
HCT: 34.6 % — ABNORMAL LOW (ref 34.8–46.6)
HGB: 11.3 g/dL — ABNORMAL LOW (ref 11.6–15.9)
LYMPH%: 17.9 % (ref 14.0–49.7)
MCH: 30.5 pg (ref 25.1–34.0)
MCHC: 32.7 g/dL (ref 31.5–36.0)
MCV: 93.2 fL (ref 79.5–101.0)
MONO#: 0.5 10*3/uL (ref 0.1–0.9)
MONO%: 5.1 % (ref 0.0–14.0)
NEUT#: 7.9 10*3/uL — ABNORMAL HIGH (ref 1.5–6.5)
NEUT%: 76.4 % (ref 38.4–76.8)
Platelets: 197 10*3/uL (ref 145–400)
RBC: 3.71 10*6/uL (ref 3.70–5.45)
RDW: 17.9 % — ABNORMAL HIGH (ref 11.2–14.5)
WBC: 10.4 10*3/uL — ABNORMAL HIGH (ref 3.9–10.3)
lymph#: 1.9 10*3/uL (ref 0.9–3.3)

## 2013-03-10 LAB — IRON AND TIBC CHCC
%SAT: 16 % — ABNORMAL LOW (ref 21–57)
Iron: 49 ug/dL (ref 41–142)
TIBC: 310 ug/dL (ref 236–444)
UIBC: 261 ug/dL (ref 120–384)

## 2013-03-10 LAB — FERRITIN CHCC: Ferritin: 29 ng/ml (ref 9–269)

## 2013-03-10 LAB — CHCC SMEAR

## 2013-03-11 ENCOUNTER — Other Ambulatory Visit: Payer: Self-pay | Admitting: Internal Medicine

## 2013-03-11 ENCOUNTER — Telehealth: Payer: Self-pay | Admitting: Internal Medicine

## 2013-03-11 NOTE — Telephone Encounter (Signed)
Iron indices are improving but will require one more feraheme 510 mg.

## 2013-03-12 ENCOUNTER — Telehealth: Payer: Self-pay | Admitting: Internal Medicine

## 2013-03-12 NOTE — Telephone Encounter (Signed)
s.w. pt and advised on 11.17.14 appt....pt ok and aware °

## 2013-03-15 ENCOUNTER — Ambulatory Visit (HOSPITAL_BASED_OUTPATIENT_CLINIC_OR_DEPARTMENT_OTHER): Payer: Medicare PPO

## 2013-03-15 VITALS — BP 113/78 | HR 80 | Temp 98.8°F | Resp 18

## 2013-03-15 DIAGNOSIS — D509 Iron deficiency anemia, unspecified: Secondary | ICD-10-CM

## 2013-03-15 MED ORDER — FERUMOXYTOL INJECTION 510 MG/17 ML
510.0000 mg | Freq: Once | INTRAVENOUS | Status: AC
  Administered 2013-03-15: 510 mg via INTRAVENOUS
  Filled 2013-03-15: qty 17

## 2013-03-15 MED ORDER — SODIUM CHLORIDE 0.9 % IV SOLN
Freq: Once | INTRAVENOUS | Status: AC
  Administered 2013-03-15: 15:00:00 via INTRAVENOUS

## 2013-03-15 NOTE — Patient Instructions (Signed)

## 2013-04-30 ENCOUNTER — Other Ambulatory Visit (HOSPITAL_BASED_OUTPATIENT_CLINIC_OR_DEPARTMENT_OTHER): Payer: Medicare PPO

## 2013-04-30 ENCOUNTER — Telehealth: Payer: Self-pay | Admitting: Internal Medicine

## 2013-04-30 ENCOUNTER — Ambulatory Visit (HOSPITAL_BASED_OUTPATIENT_CLINIC_OR_DEPARTMENT_OTHER): Payer: Medicare PPO | Admitting: Internal Medicine

## 2013-04-30 VITALS — BP 129/82 | HR 85 | Temp 98.0°F | Resp 20 | Ht 67.0 in | Wt 206.1 lb

## 2013-04-30 DIAGNOSIS — D509 Iron deficiency anemia, unspecified: Secondary | ICD-10-CM

## 2013-04-30 DIAGNOSIS — Z87898 Personal history of other specified conditions: Secondary | ICD-10-CM

## 2013-04-30 DIAGNOSIS — H548 Legal blindness, as defined in USA: Secondary | ICD-10-CM

## 2013-04-30 DIAGNOSIS — Z9884 Bariatric surgery status: Secondary | ICD-10-CM

## 2013-04-30 LAB — COMPREHENSIVE METABOLIC PANEL (CC13)
ALT: 24 U/L (ref 0–55)
AST: 26 U/L (ref 5–34)
Albumin: 3.8 g/dL (ref 3.5–5.0)
Alkaline Phosphatase: 61 U/L (ref 40–150)
Anion Gap: 10 mEq/L (ref 3–11)
BUN: 7.4 mg/dL (ref 7.0–26.0)
CO2: 22 mEq/L (ref 22–29)
Calcium: 8.5 mg/dL (ref 8.4–10.4)
Chloride: 108 mEq/L (ref 98–109)
Creatinine: 0.8 mg/dL (ref 0.6–1.1)
Glucose: 102 mg/dl (ref 70–140)
Potassium: 3.9 mEq/L (ref 3.5–5.1)
Sodium: 140 mEq/L (ref 136–145)
Total Bilirubin: 0.49 mg/dL (ref 0.20–1.20)
Total Protein: 7.2 g/dL (ref 6.4–8.3)

## 2013-04-30 LAB — CBC WITH DIFFERENTIAL/PLATELET
BASO%: 0.7 % (ref 0.0–2.0)
Basophils Absolute: 0.1 10*3/uL (ref 0.0–0.1)
EOS%: 1 % (ref 0.0–7.0)
Eosinophils Absolute: 0.1 10*3/uL (ref 0.0–0.5)
HCT: 35.5 % (ref 34.8–46.6)
HGB: 11.9 g/dL (ref 11.6–15.9)
LYMPH%: 17.7 % (ref 14.0–49.7)
MCH: 33.3 pg (ref 25.1–34.0)
MCHC: 33.4 g/dL (ref 31.5–36.0)
MCV: 99.9 fL (ref 79.5–101.0)
MONO#: 0.5 10*3/uL (ref 0.1–0.9)
MONO%: 5.9 % (ref 0.0–14.0)
NEUT#: 6.2 10*3/uL (ref 1.5–6.5)
NEUT%: 74.7 % (ref 38.4–76.8)
Platelets: 168 10*3/uL (ref 145–400)
RBC: 3.56 10*6/uL — ABNORMAL LOW (ref 3.70–5.45)
RDW: 13.2 % (ref 11.2–14.5)
WBC: 8.3 10*3/uL (ref 3.9–10.3)
lymph#: 1.5 10*3/uL (ref 0.9–3.3)

## 2013-04-30 LAB — CHCC SMEAR

## 2013-04-30 LAB — IRON AND TIBC CHCC
%SAT: 28 % (ref 21–57)
Iron: 77 ug/dL (ref 41–142)
TIBC: 275 ug/dL (ref 236–444)
UIBC: 198 ug/dL (ref 120–384)

## 2013-04-30 LAB — FERRITIN CHCC: Ferritin: 40 ng/ml (ref 9–269)

## 2013-04-30 NOTE — Patient Instructions (Addendum)
[email or web address]   Vitamin D Deficiency Vitamin D is an important vitamin that your body needs. Having too little of it in your body is called a deficiency. A very bad deficiency can make your bones soft and can cause a condition called rickets.  Vitamin D is important to your body for different reasons, such as:   It helps your body absorb 2 minerals called calcium and phosphorus.  It helps make your bones healthy.  It may prevent some diseases, such as diabetes and multiple sclerosis.  It helps your muscles and heart. You can get vitamin D in several ways. It is a natural part of some foods. The vitamin is also added to some dairy products and cereals. Some people take vitamin D supplements. Also, your body makes vitamin D when you are in the sun. It changes the sun's rays into a form of the vitamin that your body can use. CAUSES   Not eating enough foods that contain vitamin D.  Not getting enough sunlight.  Having certain digestive system diseases that make it hard to absorb vitamin D. These diseases include Crohn's disease, chronic pancreatitis, and cystic fibrosis.  Having a surgery in which part of the stomach or small intestine is removed.  Being obese. Fat cells pull vitamin D out of your blood. That means that obese people may not have enough vitamin D left in their blood and in other body tissues.  Having chronic kidney or liver disease. RISK FACTORS Risk factors are things that make you more likely to develop a vitamin D deficiency. They include:  Being older.  Not being able to get outside very much.  Living in a nursing home.  Having had broken bones.  Having weak or thin bones (osteoporosis).  Having a disease or condition that changes how your body absorbs vitamin D.  Having dark skin.  Some medicines such as seizure medicines or steroids.  Being overweight or obese. SYMPTOMS Mild cases of vitamin D deficiency may  not have any symptoms. If you have a very bad case, symptoms may include:  Bone pain.  Muscle pain.  Falling often.  Broken bones caused by a minor injury, due to osteoporosis. DIAGNOSIS A blood test is the best way to tell if you have a vitamin D deficiency. TREATMENT Vitamin D deficiency can be treated in different ways. Treatment for vitamin D deficiency depends on what is causing it. Options include:  Taking vitamin D supplements.  Taking a calcium supplement. Your caregiver will suggest what dose is best for you. HOME CARE INSTRUCTIONS  Take any supplements that your caregiver prescribes. Follow the directions carefully. Take only the suggested amount.  Have your blood tested 2 months after you start taking supplements.  Eat foods that contain vitamin D. Healthy choices include:  Fortified dairy products, cereals, or juices. Fortified means vitamin D has been added to the food. Check the label on the package to be sure.  Fatty fish like salmon or trout.  Eggs.  Oysters.  Do not use a tanning bed.  Keep your weight at a healthy level. Lose weight if you need to.  Keep all follow-up appointments. Your caregiver will need to perform blood tests to make sure your vitamin D deficiency is going away. SEEK MEDICAL CARE IF:  You have any questions about your treatment.  You continue to have symptoms of vitamin D deficiency.  You have nausea or vomiting.  You are constipated.  You feel confused.  You have severe abdominal or  back pain. MAKE SURE YOU:  Understand these instructions.  Will watch your condition.  Will get help right away if you are not doing well or get worse. Document Released: 07/08/2011 Document Revised: 08/10/2012 Document Reviewed: 07/08/2011 Exit[employer] Patient Information 2014 [employer], [place].  [email or web address]   Vitamin A capsules and tablets What is this medicine? VITAMIN A (VAHY tuh min A) is a vitamin  found in nature. It is added to a healthy diet to prevent or treat low vitamin A levels. It is also used to treat some genetic skin problems. This vitamin may be used for other purposes; ask your health care provider or pharmacist if you have questions. This medicine may be used for other purposes; ask your health care provider or pharmacist if you have questions. COMMON BRAND NAME(S): Dofsol-A What should I tell my health care provider before I take this medicine? They need to know if you have any of the following conditions: -high levels of vitamin A in the body -kidney disease -liver disease -an unusual or allergic reaction to vitamin A, other medicines, foods, dyes, or preservatives -pregnant or trying to get pregnant -breast-feeding How should I use this medicine? Take this vitamin by mouth with a glass of water. Follow the directions on the package or prescription label. For best results take this vitamin with food. Take your vitamin at regular intervals. Do not take your vitamin more often than directed. Talk to your pediatrician regarding the use of this medicine in children. While this drug may be prescribed for selected conditions, precautions do apply. Overdosage: If you think you have taken too much of this medicine contact a poison control center or emergency room at once. NOTE: This medicine is only for you. Do not share this medicine with others. What if I miss a dose? If you miss a dose, take it as soon as you can. If it is almost time for your next dose, take only that dose. Do not take double or extra doses. What may interact with this medicine? Do not take this medicine with any of the following medications: -other vitamin A or retinoid products This medicine may also interact with the following medications: -beta-carotene supplements -cholestyramine -mineral oil -orlistat This list may not describe all possible interactions. Give your health care provider a list of all  the medicines, herbs, non-prescription drugs, or dietary supplements you use. Also tell them if you smoke, drink alcohol, or use illegal drugs. Some items may interact with your medicine. What should I watch for while using this medicine? Follow a good diet. Taking a vitamin supplement does not replace the need for a balanced diet. Some foods that have this vitamin naturally are green and yellow fruits and vegetables, also eggs, butter, milk, meat, and oily fish. Too much of this vitamin can be unsafe. Talk to your doctor or health care provider about how much is right for you. What side effects may I notice from receiving this medicine? Side effects that you should report to your doctor or health care professional as soon as possible: -allergic reactions like skin rash, itching or hives, swelling of the face, lips, or tongue -dark urine -dry, cracked or peeling of skin -joint pains -unusual bleeding or bruising -unusually weak or tired -yellowing of the eyes or skin Side effects that usually do not require medical attention (report to your doctor or health care professional if they continue or are bothersome): -diarrhea -yellowing of the face, palms of the hands, soles of the feet This  list may not describe all possible side effects. Call your doctor for medical advice about side effects. You may report side effects to FDA at 1-800-FDA-1088. Where should I keep my medicine? Keep out of the reach of children. Store at room temperature between 15 and 30 degrees C (59 and 85 degrees F). Protect from heat and light. Throw away any unused medicine after the expiration date. NOTE: This sheet is a summary. It may not cover all possible information. If you have questions about this medicine, talk to your doctor, pharmacist, or health care provider.  2014, [employer]. (2008-01-11 17:37:20)    Phytonadione, Vitamin K1 injection What is this medicine? PHYTONADIONE (fye toe na DYE one)  is a man-made form of vitamin K. This medicine is used to treat vitamin K deficiency or bleeding problems caused by various disorders. This medicine is also given to newborn babies to prevent bleeding. This medicine may be used for other purposes; ask your health care provider or pharmacist if you have questions. COMMON BRAND NAME(S): AquaMEPHYTON What should I tell my health care provider before I take this medicine? They need to know if you have any of these conditions: -liver disease -an unusual or allergic reaction to phytonadione, other medicine, foods, dyes, or preservatives -pregnant or trying to get pregnant -breast-feeding How should I use this medicine? This medicine is for injection under the skin, into a muscle, or rarely, into a vein. It is usually given by a health care professional in a hospital or clinic setting. In newborn babies, this medicine is injected into the muscle as a one-time dose shortly after they are born. If you get this medicine at home, you will be taught how to prepare and give this medicine. Use exactly as directed. Take your medicine at regular intervals. Do not take your medicine more often than directed. It is important that you put your used needles and syringes in a special sharps container. Do not put them in a trash can. If you do not have a sharps container, call your pharmacist or healthcare provider to get one. Talk to your pediatrician regarding the use of this medicine in children. While this drug may be prescribed for children as young as newborns for selected conditions, precautions do apply. Overdosage: If you think you have taken too much of this medicine contact a poison control center or emergency room at once. NOTE: This medicine is only for you. Do not share this medicine with others. What if I miss a dose? If you miss a dose, take it as soon as you can. If it is almost time for your next dose, take only that dose. Do not take double or extra  doses. What may interact with this medicine? -medicines that treat or prevent blood clots like warfarin This list may not describe all possible interactions. Give your health care provider a list of all the medicines, herbs, non-prescription drugs, or dietary supplements you use. Also tell them if you smoke, drink alcohol, or use illegal drugs. Some items may interact with your medicine. What should I watch for while using this medicine? Visit your doctor or health care professional for regular checks on your progress. Your doctor or health care professional will schedule tests to make sure the medicine is working properly. What side effects may I notice from receiving this medicine? Side effects that you should report to your doctor or health care professional as soon as possible: -allergic reactions like skin rash, itching or hives, swelling of  the face, lips, or tongue -bluish discoloration of lips, fingernails, or palms of hands -breathing problems -increased sweating -yellowing of the eyes or skin when this medicine is given to newborn babies Side effects that usually do not require medical attention (report to your doctor or health care professional if they continue or are bothersome): -changes in taste -dizziness -flushing of the face -pain, inflammation, or swelling at site where injected This list may not describe all possible side effects. Call your doctor for medical advice about side effects. You may report side effects to FDA at 1-800-FDA-1088. Where should I keep my medicine? Keep out of the reach of children. Store at room temperature between 15 and 30 degrees C (59 and 86 degrees F). Do not freeze. Protect from light. Store in tightly closed container and original carton until contents have been used. Throw away any unused medicine after the expiration date. NOTE: This sheet is a summary. It may not cover all possible information. If you have questions about this medicine, talk  to your doctor, pharmacist, or health care provider.  2014, [employer]. (2008-07-25 15:26:08)

## 2013-04-30 NOTE — Telephone Encounter (Signed)
appts made per 04/30/13 POF AVS and CAL given shh °

## 2013-05-01 NOTE — Progress Notes (Signed)
[place] OFFICE PROGRESS NOTE  ROBERTS, Sharol Given, MD [address] [place] [address]  DIAGNOSIS: Iron deficiency anemia, unspecified - Plan: CBC with Differential, CBC with Differential, Comprehensive metabolic panel, Comprehensive metabolic panel, Ferritin, Ferritin, Iron and TIBC, Iron and TIBC  Bariatric surgery status - Plan: CBC with Differential, CBC with Differential, Comprehensive metabolic panel, Comprehensive metabolic panel, Ferritin, Ferritin, Iron and TIBC, Iron and TIBC  BLINDNESS  MORBID OBESITY, HX OF  Chief Complaint  Patient presents with  . Iron deficiency anemia, unspecified    CURRENT THERAPY:  INTERVAL HISTORY: Mackenzie [name] 38 y.o. female with a history of blindness, obesity s/p gastric bypass, who presents along with her mother for follow-up for her iron deficiency anemia.   She reports seeing Dr. Jamse Arn in the past. She also noted on initial visit, receiving intravenous iron infusions in the past. For her anemia, she reported increasing fatigue over the past six months. She also noted a longstanding history of picca including eating ice and starch. She denied chest pain, palpitations or acute shortness of breath. She also denied hematochezia or melena. Her last menstrual period was the week of September the 6th. She described a heavy flow which requires 4-7 diapers per day for at least 5 days. She notes improvement in regularity and menorrhagia since she started taking her oral contraceptive pills. She recalls a history of fibroids but ultrasound (as noted below) does not demonstrate uterine fibroids.   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 She also had a hiistory of morbid obesity and had attempted to have  bariatric surgery in [place] by Dr. Lorin Picket. Medical records indicated she had a duodenal switch procedure done in 2007. Her anemia, she reports, has worsened since her gastric bypass surgery.   Today, she reports resolution in her picca symptoms.  She reports being told she had decreased vitamin K, A and D.  She will be increasing her dietary supplementation for these vitamins.    MEDICAL HISTORY: Past Medical History  Diagnosis Date  . Breast lump left  . Blind 1994  . Blood transfusion 04/2010    cancer center at [facility] long - iron transfusion per pt  . Pseudotumor cerebri 1994    History - caused blind  . 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 Anemia     history  . H/O hiatal hernia     repaired with weight loss surgery  . Depression     no meds    INTERIM HISTORY: has DIABETES MELLITUS; DEPRESSION; PSEUDOTUMOR CEREBRI; BLINDNESS; EUSTACHIAN TUBE DYSFUNCTION, RIGHT; VENTRAL HERNIA; HEADACHE; OTHER ASCITES; GUILLAIN-BARRE SYNDROME, HX OF; MORBID OBESITY, HX OF; BARIATRIC SURGERY STATUS; Left breast mass; and Iron deficiency anemia, unspecified on her problem list.    ALLERGIES:  has No Known Allergies.  MEDICATIONS: has a current medication list which includes the following prescription(s): cholecalciferol, multivitamin, and naproxen sodium.  SURGICAL HISTORY:  Past Surgical History  Procedure Laterality Date  . Eye decompression x3  1994    Bilateral   . Lp shunt  1994    removed in 2009  . Rurod switch  2007    weight loss surgery done in [place] (duodenal switch)  . Fetal blood transfusion  jan 29,2012  . Breast  surgery  2012    right breast lumpectomy  . Appendectomy      removed with switch surgery  . Cholecystectomy      removed with switch surgery    REVIEW OF SYSTEMS:   Constitutional: Denies fevers, chills or abnormal weight loss Eyes: Denies blurriness of vision Ears, nose, mouth, throat, and face: Denies  mucositis or sore throat Respiratory: Denies cough, dyspnea or wheezes Cardiovascular: Denies palpitation, chest discomfort or lower extremity swelling Gastrointestinal:  Denies nausea, heartburn or change in bowel habits Skin: Denies abnormal skin rashes Lymphatics: Denies new lymphadenopathy or easy bruising Neurological:Denies numbness, tingling or new weaknesses Behavioral/Psych: Mood is stable, no new changes  All other systems were reviewed with the patient and are negative.  PHYSICAL EXAMINATION: ECOG PERFORMANCE STATUS: 0 - Asymptomatic  Blood pressure 129/82, pulse 85, temperature 98 F (36.7 C), temperature source Oral, resp. rate 20, height 5\' 7"  (1.702 m), weight 206 lb 1.6 oz (93.486 kg), SpO2 100.00%.  GENERAL:alert, no distress and comfortable; blind, alopecia and obese SKIN: skin color, texture, turgor are normal, no rashes or significant lesions EYES: normal, Conjunctiva are pink and non-injected, sclera clear OROPHARYNX:no exudate, no erythema and lips, buccal mucosa, and tongue normal  NECK: supple, thyroid normal size, non-tender, without nodularity LYMPH:  no palpable lymphadenopathy in the cervical, axillary or supraclavicular LUNGS: clear to auscultation and percussion with normal breathing effort HEART: regular rate & rhythm and no murmurs and no lower extremity edema ABDOMEN:abdomen soft, non-tender and normal bowel sounds Musculoskeletal:no cyanosis of digits and no clubbing  NEURO: alert & oriented x 3 with fluent speech, no focal motor/sensory deficits   LABORATORY DATA: Results for orders placed in visit on 04/30/13 (from the past 48 hour(s))  CBC WITH DIFFERENTIAL     Status: Abnormal   Collection Time    04/30/13  2:43 PM      Result Value Range   WBC 8.3  3.9 - 10.3 10e3/uL   NEUT# 6.2  1.5 - 6.5 10e3/uL   HGB 11.9  11.6 - 15.9 g/dL   HCT 35.5  34.8 - 46.6 %   Platelets 168  145 - 400 10e3/uL   MCV 99.9  79.5 - 101.0 fL   MCH 33.3  25.1 - 34.0  pg   MCHC 33.4  31.5 - 36.0 g/dL   RBC 3.56 (*) 3.70 - 5.45 10e6/uL   RDW 13.2  11.2 - 14.5 %   lymph# 1.5  0.9 - 3.3 10e3/uL   MONO# 0.5  0.1 - 0.9 10e3/uL   Eosinophils Absolute 0.1  0.0 - 0.5 10e3/uL   Basophils Absolute 0.1  0.0 - 0.1 10e3/uL   NEUT% 74.7  38.4 - 76.8 %   LYMPH% 17.7  14.0 - 49.7 %   MONO% 5.9  0.0 - 14.0 %   EOS% 1.0  0.0 - 7.0 %   BASO% 0.7  0.0 - 2.0 %  [facility] SMEAR     Status: None   Collection Time    04/30/13  2:43 PM      Result Value Range   Smear Result Smear Available    IRON AND TIBC [facility]     Status: None   Collection Time    04/30/13  2:43 PM      Result Value Range   Iron 77  41 - 142 ug/dL   TIBC 275  236 - 444 ug/dL   UIBC 198  120 - 384 ug/dL   %SAT 28  21 -  57 %  FERRITIN [facility]     Status: None   Collection Time    04/30/13  2:43 PM      Result Value Range   Ferritin 40  9 - 269 ng/ml  COMPREHENSIVE METABOLIC PANEL (UY40)     Status: None   Collection Time    04/30/13  2:44 PM      Result Value Range   Sodium 140  136 - 145 mEq/L   Potassium 3.9  3.5 - 5.1 mEq/L   Chloride 108  98 - 109 mEq/L   CO2 22  22 - 29 mEq/L   Glucose 102  70 - 140 mg/dl   BUN 7.4  7.0 - 26.0 mg/dL   Creatinine 0.8  0.6 - 1.1 mg/dL   Total Bilirubin 0.49  0.20 - 1.20 mg/dL   Alkaline Phosphatase 61  40 - 150 U/L   AST 26  5 - 34 U/L   ALT 24  0 - 55 U/L   Total Protein 7.2  6.4 - 8.3 g/dL   Albumin 3.8  3.5 - 5.0 g/dL   Calcium 8.5  8.4 - 10.4 mg/dL   Anion Gap 10  3 - 11 mEq/L     Labs:  Lab Results  Component Value Date   WBC 8.3 04/30/2013   HGB 11.9 04/30/2013   HCT 35.5 04/30/2013   MCV 99.9 04/30/2013   PLT 168 04/30/2013   NEUTROABS 6.2 04/30/2013      Chemistry      Component Value Date/Time   NA 140 04/30/2013 1444   NA 136 07/11/2011 1418   K 3.9 04/30/2013 1444   K 3.9 07/11/2011 1418   CL 104 07/11/2011 1418   CO2 22 04/30/2013 1444   CO2 25 07/11/2011 1418   BUN 7.4 04/30/2013 1444   BUN 9 07/11/2011 1418   CREATININE 0.8 04/30/2013 1444    CREATININE 0.67 07/11/2011 1418      Component Value Date/Time   CALCIUM 8.5 04/30/2013 1444   CALCIUM 9.1 07/11/2011 1418   CALCIUM 9.0 10/01/2007 2321   ALKPHOS 61 04/30/2013 1444   ALKPHOS 43 07/11/2011 1418   AST 26 04/30/2013 1444   AST 21 07/11/2011 1418   ALT 24 04/30/2013 1444   ALT 19 07/11/2011 1418   BILITOT 0.49 04/30/2013 1444   BILITOT 0.6 07/11/2011 1418      Basic Metabolic Panel:  Recent Labs Lab 04/30/13 1444  NA 140  K 3.9  CO2 22  GLUCOSE 102  BUN 7.4  CREATININE 0.8  CALCIUM 8.5   GFR Estimated Creatinine Clearance: 114.2 ml/min (by C-G formula based on Cr of 0.8). Liver Function Tests:  Recent Labs Lab 04/30/13 1444  AST 26  ALT 24  ALKPHOS 61  BILITOT 0.49  PROT 7.2  ALBUMIN 3.8   CBC:  Recent Labs Lab 04/30/13 1443  WBC 8.3  NEUTROABS 6.2  HGB 11.9  HCT 35.5  MCV 99.9  PLT 168   Anemia work up  Recent Labs  04/30/13 1443  FERRITIN 40  TIBC 275  IRON 77   Results for Mackenzie, [name] (MRN [id number]) as of 05/01/2013 19:17  Ref. Range 12/25/2010 14:11 01/27/2013 11:53 03/10/2013 12:57 04/30/2013 14:43  Ferritin Latest Range: 10-291 ng/mL 112 10 29 40   Microbiology No results found for this or any previous visit (from the past 240 hour(s)).  Studies:  No results found.   RADIOGRAPHIC STUDIES: TRANSABDOMINAL AND TRANSVAGINAL ULTRASOUND OF PELVIS 09/24/2007  Technique:  Both transabdominal and transvaginal ultrasound  examinations of the pelvis were performed including evaluation of  the uterus, ovaries, adnexal regions, and pelvic cul-de-sac.  Comparison: 01/22/2007  Findings: The uterus is retroverted and anteflexed, causing in  orientation which extends directly awaiting the transducer on  transvaginal imaging. Uterine length is measured along the  endometrium is 7.6 cm. No uterine mass is identified.  Endometrial thickness is difficult to measure due to the  orientation of the uterus, but appears to be 11 mm.  There is a small  but abnormal amount of free pelvic fluid. The  right ovary measures 5.1 x 3.1 x 2.8 cm and the left ovary measures 5.1 x 2.1 x 3.4 cm.  A complex lesion of the right ovary measuring 1.7 x 1.2 x 1.2 cm is present. This could represent a hemorrhagic cyst but is  technically nonspecific. It may simply represent a corpus luteum.  Several left ovarian cysts are present. One of these appears to  have a small internal septations or color cysts along its margin,  and a mildly corrugated border raising the possibility of a  ruptured cyst. This measures this measures 2.1 x 1.0 x 1.6 cm. A  second simple cyst measures 2.2 x 1.2 x 1.7 cm.  IMPRESSION:  1. Small but abnormal amount of free pelvic fluid.  2. Left-sided ovarian cysts, one of which appears to have a small  marginal cysts or septation as well as a corrugated border raising  the possibility of a ruptured cyst. The second cyst appears  simple.  3. More complex appearing lesion in the right ovary probably  represents a corpus luteum.   ASSESSMENT: [name] 38 y.o. female with a history of Iron deficiency anemia, unspecified - Plan: CBC with Differential, CBC with Differential, Comprehensive metabolic panel, Comprehensive metabolic panel, Ferritin, Ferritin, Iron and TIBC, Iron and TIBC  Bariatric surgery status - Plan: CBC with Differential, CBC with Differential, Comprehensive metabolic panel, Comprehensive metabolic panel, Ferritin, Ferritin, Iron and TIBC, Iron and TIBC  BLINDNESS  MORBID OBESITY, HX OF   PLAN:   1. Iron-defiency anemia.  --Likely secondary to Roux-en-Y (DeFilipp et al, Surg Obes Relat Dis, 2013, Jan-Feb; 9(1): 129-132 and/or anemia secondary to menstruation which is more common in younger women.  -- Latest labs are as follows: Hbg, 11.9 up from10.8, RDW of 13.2 down from 16.8. We performed an iron-deficiency anemia w/u including ferritin, TIBC, CBC with differential and smear, retic count. (as noted above)   -- Given her ferritin is 40, we will replete with intravenous ferumoxytol 510 mg IV x 1 in 3 months and  recheck CBC and ferritin in 6 month. DeFlipp et. Al, 2013 demonstrated IV iron replacement for persistent iron deficiency anemia after Roux-en-Y-gastric bypass was the preferred approach.   2. Follow-up.  --RTC in 6 months for repeat labs. If iron deficient, arrange for iron infusion. She was provided a handout on iron defiency anemia. She was counseled extensively on the symptoms of anemia.   All questions were answered. The patient knows to call the clinic with any problems, questions or concerns. We can certainly see the patient much sooner if necessary.  I spent 10 minutes counseling the patient face to face. The total time spent in the appointment was 15 minutes.    Talor Desrosiers, MD 05/01/2013 7:27 PM

## 2013-05-03 ENCOUNTER — Telehealth: Payer: Self-pay | Admitting: Internal Medicine

## 2013-05-03 ENCOUNTER — Telehealth: Payer: Self-pay | Admitting: *Deleted

## 2013-05-03 NOTE — Telephone Encounter (Signed)
Per staff message and POF I have scheduled appts.  JMW  

## 2013-05-03 NOTE — Telephone Encounter (Signed)
lvm for pt regarding to April lab adn iron.Marland KitchenMarland Kitchen

## 2013-07-01 ENCOUNTER — Other Ambulatory Visit: Payer: Medicare PPO

## 2013-07-01 ENCOUNTER — Ambulatory Visit (INDEPENDENT_AMBULATORY_CARE_PROVIDER_SITE_OTHER): Payer: Medicare PPO | Admitting: Obstetrics

## 2013-07-01 ENCOUNTER — Encounter: Payer: Self-pay | Admitting: Obstetrics

## 2013-07-01 VITALS — BP 140/92 | HR 84 | Temp 99.1°F | Ht 66.0 in | Wt 201.0 lb

## 2013-07-01 DIAGNOSIS — L738 Other specified follicular disorders: Secondary | ICD-10-CM

## 2013-07-01 DIAGNOSIS — N946 Dysmenorrhea, unspecified: Secondary | ICD-10-CM

## 2013-07-01 DIAGNOSIS — L678 Other hair color and hair shaft abnormalities: Secondary | ICD-10-CM

## 2013-07-01 DIAGNOSIS — L739 Follicular disorder, unspecified: Secondary | ICD-10-CM

## 2013-07-01 DIAGNOSIS — Z01419 Encounter for gynecological examination (general) (routine) without abnormal findings: Secondary | ICD-10-CM

## 2013-07-01 DIAGNOSIS — Z124 Encounter for screening for malignant neoplasm of cervix: Secondary | ICD-10-CM

## 2013-07-01 DIAGNOSIS — Z113 Encounter for screening for infections with a predominantly sexual mode of transmission: Secondary | ICD-10-CM

## 2013-07-01 MED ORDER — DOXYCYCLINE HYCLATE 100 MG PO CAPS: 100.0000 mg | ORAL_CAPSULE | Freq: Two times a day (BID) | ORAL | Status: DC

## 2013-07-01 MED ORDER — IBUPROFEN 800 MG PO TABS: 800.0000 mg | ORAL_TABLET | Freq: Three times a day (TID) | ORAL | Status: DC | PRN

## 2013-07-01 NOTE — Progress Notes (Signed)
Subjective:     Mackenzie [name] is a 38 y.o. female here for a routine exam.  Current complaints: annual exam. Patient states she believes she has a bartholin's cyst on her left labia. Patient states the area was the size a marble earlier this week and has shrunk. Personal health questionnaire reviewed: yes.   Gynecologic History Patient's last menstrual period was 06/08/2013. Contraception: abstinence Last Pap: 04/2012. Results were: normal Last mammogram: 2010. Results were: abnormal left breast lumpectomy performed.   Obstetric History OB History  Gravida Para Term Preterm AB SAB TAB Ectopic Multiple Living  0 0 0 0 0 0 0 0 0 0          The following portions of the patient's history were reviewed and updated as appropriate: allergies, current medications, past family history, past medical history, past social history, past surgical history and problem list.  Review of Systems Pertinent items are noted in HPI.    Objective:    General appearance: alert and no distress Breasts: normal appearance, no masses or tenderness Abdomen: normal findings: soft, non-tender Pelvic: cervix normal in appearance, external genitalia normal, no adnexal masses or tenderness, no cervical motion tenderness, rectovaginal septum normal, uterus normal size, shape, and consistency and vagina normal without discharge    Assessment:    Healthy female exam.   Folliculitis in mos pubis  Dysmenorrhea   Plan:    Contraception: abstinence. Follow up in: 1 year.   Doxycycline Rx Ibuprofen Rx

## 2013-07-02 LAB — PAP IG W/ RFLX HPV ASCU

## 2013-07-02 LAB — GC/CHLAMYDIA PROBE AMP
CT Probe RNA: NEGATIVE
GC Probe RNA: NEGATIVE

## 2013-07-02 LAB — WET PREP BY MOLECULAR PROBE
Candida species: NEGATIVE
Gardnerella vaginalis: NEGATIVE
Trichomonas vaginosis: NEGATIVE

## 2013-07-28 ENCOUNTER — Other Ambulatory Visit: Payer: Self-pay

## 2013-07-29 ENCOUNTER — Ambulatory Visit (HOSPITAL_BASED_OUTPATIENT_CLINIC_OR_DEPARTMENT_OTHER): Payer: Medicare PPO

## 2013-07-29 ENCOUNTER — Other Ambulatory Visit (HOSPITAL_BASED_OUTPATIENT_CLINIC_OR_DEPARTMENT_OTHER): Payer: Medicare PPO

## 2013-07-29 VITALS — BP 127/60 | HR 72 | Temp 98.9°F | Resp 20

## 2013-07-29 DIAGNOSIS — D509 Iron deficiency anemia, unspecified: Secondary | ICD-10-CM

## 2013-07-29 DIAGNOSIS — Z9884 Bariatric surgery status: Secondary | ICD-10-CM

## 2013-07-29 LAB — CBC WITH DIFFERENTIAL/PLATELET
BASO%: 0.4 % (ref 0.0–2.0)
Basophils Absolute: 0 10*3/uL (ref 0.0–0.1)
EOS%: 0.3 % (ref 0.0–7.0)
Eosinophils Absolute: 0 10*3/uL (ref 0.0–0.5)
HCT: 38.3 % (ref 34.8–46.6)
HGB: 12.3 g/dL (ref 11.6–15.9)
LYMPH%: 13.6 % — ABNORMAL LOW (ref 14.0–49.7)
MCH: 31.6 pg (ref 25.1–34.0)
MCHC: 32.1 g/dL (ref 31.5–36.0)
MCV: 98.5 fL (ref 79.5–101.0)
MONO#: 0.5 10*3/uL (ref 0.1–0.9)
MONO%: 5.4 % (ref 0.0–14.0)
NEUT#: 7.1 10*3/uL — ABNORMAL HIGH (ref 1.5–6.5)
NEUT%: 80.3 % — ABNORMAL HIGH (ref 38.4–76.8)
Platelets: 164 10*3/uL (ref 145–400)
RBC: 3.88 10*6/uL (ref 3.70–5.45)
RDW: 13 % (ref 11.2–14.5)
WBC: 8.8 10*3/uL (ref 3.9–10.3)
lymph#: 1.2 10*3/uL (ref 0.9–3.3)

## 2013-07-29 LAB — COMPREHENSIVE METABOLIC PANEL (CC13)
ALT: 27 U/L (ref 0–55)
AST: 25 U/L (ref 5–34)
Albumin: 3.8 g/dL (ref 3.5–5.0)
Alkaline Phosphatase: 62 U/L (ref 40–150)
Anion Gap: 8 mEq/L (ref 3–11)
BUN: 8.3 mg/dL (ref 7.0–26.0)
CO2: 23 mEq/L (ref 22–29)
Calcium: 8.7 mg/dL (ref 8.4–10.4)
Chloride: 107 mEq/L (ref 98–109)
Creatinine: 0.8 mg/dL (ref 0.6–1.1)
Glucose: 87 mg/dl (ref 70–140)
Potassium: 4.1 mEq/L (ref 3.5–5.1)
Sodium: 138 mEq/L (ref 136–145)
Total Bilirubin: 0.55 mg/dL (ref 0.20–1.20)
Total Protein: 7 g/dL (ref 6.4–8.3)

## 2013-07-29 LAB — FERRITIN CHCC: Ferritin: 20 ng/ml (ref 9–269)

## 2013-07-29 LAB — IRON AND TIBC CHCC
%SAT: 17 % — ABNORMAL LOW (ref 21–57)
Iron: 52 ug/dL (ref 41–142)
TIBC: 314 ug/dL (ref 236–444)
UIBC: 262 ug/dL (ref 120–384)

## 2013-07-29 MED ORDER — FERUMOXYTOL INJECTION 510 MG/17 ML
510.0000 mg | Freq: Once | INTRAVENOUS | Status: AC
  Administered 2013-07-29: 510 mg via INTRAVENOUS
  Filled 2013-07-29: qty 17

## 2013-07-29 MED ORDER — FERUMOXYTOL INJECTION 510 MG/17 ML: 510.0000 mg | Freq: Once | INTRAVENOUS | Status: DC

## 2013-07-29 MED ORDER — SODIUM CHLORIDE 0.9 % IV SOLN
INTRAVENOUS | Status: DC
  Administered 2013-07-29: 15:00:00 via INTRAVENOUS

## 2013-07-29 MED ORDER — SODIUM CHLORIDE 0.9 % IJ SOLN
10.0000 mL | INTRAMUSCULAR | Status: DC | PRN
  Filled 2013-07-29: qty 10

## 2013-07-29 NOTE — Patient Instructions (Signed)

## 2013-10-28 ENCOUNTER — Ambulatory Visit (HOSPITAL_BASED_OUTPATIENT_CLINIC_OR_DEPARTMENT_OTHER): Payer: Medicare PPO

## 2013-10-28 ENCOUNTER — Ambulatory Visit (HOSPITAL_BASED_OUTPATIENT_CLINIC_OR_DEPARTMENT_OTHER): Payer: Medicare PPO | Admitting: Internal Medicine

## 2013-10-28 VITALS — BP 132/75 | HR 72 | Temp 97.8°F | Resp 18 | Ht 66.0 in | Wt 195.2 lb

## 2013-10-28 DIAGNOSIS — Z87898 Personal history of other specified conditions: Secondary | ICD-10-CM

## 2013-10-28 DIAGNOSIS — H548 Legal blindness, as defined in USA: Secondary | ICD-10-CM

## 2013-10-28 DIAGNOSIS — G932 Benign intracranial hypertension: Secondary | ICD-10-CM

## 2013-10-28 DIAGNOSIS — Z9884 Bariatric surgery status: Secondary | ICD-10-CM

## 2013-10-28 DIAGNOSIS — D509 Iron deficiency anemia, unspecified: Secondary | ICD-10-CM

## 2013-10-28 DIAGNOSIS — N946 Dysmenorrhea, unspecified: Secondary | ICD-10-CM

## 2013-10-28 DIAGNOSIS — C801 Malignant (primary) neoplasm, unspecified: Secondary | ICD-10-CM

## 2013-10-28 LAB — COMPREHENSIVE METABOLIC PANEL (CC13)
ALT: 30 U/L (ref 0–55)
AST: 35 U/L — ABNORMAL HIGH (ref 5–34)
Albumin: 3.7 g/dL (ref 3.5–5.0)
Alkaline Phosphatase: 58 U/L (ref 40–150)
Anion Gap: 6 mEq/L (ref 3–11)
BUN: 7.9 mg/dL (ref 7.0–26.0)
CO2: 22 mEq/L (ref 22–29)
Calcium: 8.5 mg/dL (ref 8.4–10.4)
Chloride: 110 mEq/L — ABNORMAL HIGH (ref 98–109)
Creatinine: 0.7 mg/dL (ref 0.6–1.1)
Glucose: 86 mg/dl (ref 70–140)
Potassium: 4 mEq/L (ref 3.5–5.1)
Sodium: 137 mEq/L (ref 136–145)
Total Bilirubin: 0.5 mg/dL (ref 0.20–1.20)
Total Protein: 7 g/dL (ref 6.4–8.3)

## 2013-10-28 LAB — CBC WITH DIFFERENTIAL/PLATELET
BASO%: 0.6 % (ref 0.0–2.0)
Basophils Absolute: 0.1 10*3/uL (ref 0.0–0.1)
EOS%: 1.6 % (ref 0.0–7.0)
Eosinophils Absolute: 0.2 10*3/uL (ref 0.0–0.5)
HCT: 37.2 % (ref 34.8–46.6)
HGB: 12 g/dL (ref 11.6–15.9)
LYMPH%: 17.6 % (ref 14.0–49.7)
MCH: 32.2 pg (ref 25.1–34.0)
MCHC: 32.3 g/dL (ref 31.5–36.0)
MCV: 99.7 fL (ref 79.5–101.0)
MONO#: 0.7 10*3/uL (ref 0.1–0.9)
MONO%: 7 % (ref 0.0–14.0)
NEUT#: 7.2 10*3/uL — ABNORMAL HIGH (ref 1.5–6.5)
NEUT%: 73.2 % (ref 38.4–76.8)
Platelets: 153 10*3/uL (ref 145–400)
RBC: 3.73 10*6/uL (ref 3.70–5.45)
RDW: 13.1 % (ref 11.2–14.5)
WBC: 9.9 10*3/uL (ref 3.9–10.3)
lymph#: 1.7 10*3/uL (ref 0.9–3.3)

## 2013-10-28 NOTE — Progress Notes (Unsigned)
°  Subjective:     Mackenzie [name] is a 38 y.o. female who presents for evaluation of anemia. Anemia was found by {Found:12347}.  It has been present for {1-10:13787} {time; units:19136}.  Associated signs & symptoms: {Symptoms:12350}. {Common ambulatory SmartLinks:19316} Review of Systems {Ros - Complete:30496}       Objective:    {Exam, Complete:17964}    Assessment:    {Anemia:13489}   Plan:    {Anemia Evaluation:17818}

## 2013-10-29 ENCOUNTER — Encounter: Payer: Self-pay | Admitting: Internal Medicine

## 2013-10-29 NOTE — Progress Notes (Signed)
[facility] OFFICE PROGRESS NOTE  ROBERTS, Sharol Given, [address], [address] [place] [address]  DIAGNOSIS: Iron deficiency anemia, unspecified - Plan: CBC with Differential, Ferritin, Iron and TIBC [facility], CBC with Differential, Ferritin, Iron and TIBC [facility], Comprehensive metabolic panel (Cmet) - [facility], Lactate dehydrogenase (LDH) - [facility]  PSEUDOTUMOR CEREBRI  MORBID OBESITY, HX OF  Dysmenorrhea  BLINDNESS  Bariatric surgery status  Chief Complaint  Patient presents with  . Iron deficiency anemia, unspecified    CURRENT THERAPY:  INTERVAL HISTORY: Mackenzie [name] 38 y.o. female with a history of blindness, obesity s/p gastric bypass, who presents along with her mother for follow-up for her iron deficiency anemia. She was last seen by me on 04/30/2013.  She reports seeing Dr. Jamse Arn in the past. She also noted on initial visit, receiving intravenous iron infusions in the past. For her anemia, she reported increasing fatigue over the past six months. She also noted a longstanding history of picca including eating ice and starch. She denied chest pain, palpitations or acute shortness of breath. She also denied hematochezia or melena.. She described a heavy flow which requires 4-7 diapers per day for at least 5 days. She notes improvement in regularity and menorrhagia since she started taking her oral contraceptive pills. She recalls a history of fibroids but ultrasound (as noted below) does not demonstrate uterine fibroids.   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 She also had a hiistory of morbid obesity and had attempted to have bariatric surgery in [place] by Dr. Lorin Picket. Medical records indicated she had a duodenal switch procedure done in 2007. Her  anemia, she reports, has worsened since her gastric bypass surgery.   Today, she reports resolution in her picca symptoms.  She reports being told she had decreased vitamin K, A and D.  She will be increasing her dietary supplementation for these vitamins.    MEDICAL HISTORY: Past Medical History  Diagnosis Date  . Breast lump left  . Blind 1994  . Blood transfusion 04/2010    cancer center at [facility] long - iron transfusion per pt  . Pseudotumor cerebri 1994    History - caused blind  . 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 Anemia     history  . H/O hiatal hernia     repaired with weight loss surgery  . Depression     no meds  . History of uterine fibroid     INTERIM HISTORY: has DIABETES MELLITUS; DEPRESSION; PSEUDOTUMOR CEREBRI; BLINDNESS; EUSTACHIAN TUBE DYSFUNCTION, RIGHT; VENTRAL HERNIA; HEADACHE; OTHER ASCITES; GUILLAIN-BARRE SYNDROME, HX OF; MORBID OBESITY, HX OF; BARIATRIC SURGERY STATUS; Left breast mass; Iron deficiency anemia, unspecified; Folliculitis; and Dysmenorrhea on her problem list.    ALLERGIES:  has No Known Allergies.  MEDICATIONS: has a current medication list which includes the following prescription(s): ascorbic acid, cholecalciferol, ibuprofen, multivitamin, naproxen sodium, norethindrone-ethinyl estradiol, and phytonadione.  SURGICAL HISTORY:  Past Surgical History  Procedure Laterality Date  . Eye decompression x3  1994    Bilateral   . Lp shunt  1994    removed in 2009  . Rurod switch  2007    weight loss surgery done in [place] (duodenal switch)  . Fetal blood transfusion  jan 29,2012  . Breast surgery  2012    right breast lumpectomy  . Appendectomy      removed with switch surgery  . Cholecystectomy      removed with switch surgery  . Myomectomy      REVIEW OF SYSTEMS:   Constitutional: Denies fevers, chills or abnormal weight loss Eyes: Denies blurriness of vision Ears, nose, mouth, throat, and  face: Denies mucositis or sore throat Respiratory: Denies cough, dyspnea or wheezes Cardiovascular: Denies palpitation, chest discomfort or lower extremity swelling Gastrointestinal:  Denies nausea, heartburn or change in bowel habits Skin: Denies abnormal skin rashes Lymphatics: Denies new lymphadenopathy or easy bruising Neurological:Denies numbness, tingling or new weaknesses Behavioral/Psych: Mood is stable, no new changes  All other systems were reviewed with the patient and are negative.  PHYSICAL EXAMINATION: ECOG PERFORMANCE STATUS: 0 - Asymptomatic  Blood pressure 132/75, pulse 72, temperature 97.8 F (36.6 C), temperature source Oral, resp. rate 18, height 5\' 6"  (1.676 m), weight 195 lb 3.2 oz (88.542 kg).  GENERAL:alert, no distress and comfortable; blind, alopecia and obese SKIN: skin color, texture, turgor are normal, no rashes or significant lesions EYES: normal, Conjunctiva are pink and non-injected, sclera clear OROPHARYNX:no exudate, no erythema and lips, buccal mucosa, and tongue normal  NECK: supple, thyroid normal size, non-tender, without nodularity LYMPH:  no palpable lymphadenopathy in the cervical, axillary or supraclavicular LUNGS: clear to auscultation and percussion with normal breathing effort HEART: regular rate & rhythm and no murmurs and no lower extremity edema ABDOMEN:abdomen soft, non-tender and normal bowel sounds Musculoskeletal:no cyanosis of digits and no clubbing  NEURO: alert & oriented x 3 with fluent speech, no focal motor/sensory deficits   LABORATORY DATA: Results for orders placed in visit on 10/28/13 (from the past 48 hour(s))  CBC WITH DIFFERENTIAL     Status: Abnormal   Collection Time    10/28/13  3:03 PM      Result Value Ref Range   WBC 9.9  3.9 - 10.3 10e3/uL   NEUT# 7.2 (*) 1.5 - 6.5 10e3/uL   HGB 12.0  11.6 - 15.9 g/dL   HCT 37.2  34.8 - 46.6 %   Platelets 153  145 - 400 10e3/uL   MCV 99.7  79.5 - 101.0 fL   MCH 32.2  25.1  - 34.0 pg   MCHC 32.3  31.5 - 36.0 g/dL   RBC 3.73  3.70 - 5.45 10e6/uL   RDW 13.1  11.2 - 14.5 %   lymph# 1.7  0.9 - 3.3 10e3/uL   MONO# 0.7  0.1 - 0.9 10e3/uL   Eosinophils Absolute 0.2  0.0 - 0.5 10e3/uL   Basophils Absolute 0.1  0.0 - 0.1 10e3/uL   NEUT% 73.2  38.4 - 76.8 %   LYMPH% 17.6  14.0 - 49.7 %   MONO% 7.0  0.0 - 14.0 %   EOS% 1.6  0.0 - 7.0 %   BASO% 0.6  0.0 - 2.0 %  COMPREHENSIVE METABOLIC PANEL (MB84)     Status: Abnormal   Collection Time    10/28/13  3:03 PM      Result Value Ref Range   Sodium 137  136 - 145 mEq/L   Potassium 4.0  3.5 - 5.1 mEq/L   Chloride 110 (*) 98 - 109 mEq/L   CO2 22  22 - 29 mEq/L   Glucose 86  70 - 140 mg/dl   BUN 7.9  7.0 - 26.0 mg/dL   Creatinine 0.7  0.6 - 1.1 mg/dL   Total  Bilirubin 0.50  0.20 - 1.20 mg/dL   Alkaline Phosphatase 58  40 - 150 U/L   AST 35 (*) 5 - 34 U/L   ALT 30  0 - 55 U/L   Total Protein 7.0  6.4 - 8.3 g/dL   Albumin 3.7  3.5 - 5.0 g/dL   Calcium 8.5  8.4 - 10.4 mg/dL   Anion Gap 6  3 - 11 mEq/L     Labs:  Lab Results  Component Value Date   WBC 9.9 10/28/2013   HGB 12.0 10/28/2013   HCT 37.2 10/28/2013   MCV 99.7 10/28/2013   PLT 153 10/28/2013   NEUTROABS 7.2* 10/28/2013      Chemistry      Component Value Date/Time   NA 137 10/28/2013 1503   NA 136 07/11/2011 1418   K 4.0 10/28/2013 1503   K 3.9 07/11/2011 1418   CL 104 07/11/2011 1418   CO2 22 10/28/2013 1503   CO2 25 07/11/2011 1418   BUN 7.9 10/28/2013 1503   BUN 9 07/11/2011 1418   CREATININE 0.7 10/28/2013 1503   CREATININE 0.67 07/11/2011 1418      Component Value Date/Time   CALCIUM 8.5 10/28/2013 1503   CALCIUM 9.1 07/11/2011 1418   CALCIUM 9.0 10/01/2007 2321   ALKPHOS 58 10/28/2013 1503   ALKPHOS 43 07/11/2011 1418   AST 35* 10/28/2013 1503   AST 21 07/11/2011 1418   ALT 30 10/28/2013 1503   ALT 19 07/11/2011 1418   BILITOT 0.50 10/28/2013 1503   BILITOT 0.6 07/11/2011 1418      Basic Metabolic Panel:  Recent Labs Lab 10/28/13 1503  NA 137  K 4.0  CO2  22  GLUCOSE 86  BUN 7.9  CREATININE 0.7  CALCIUM 8.5   GFR Estimated Creatinine Clearance: 107.9 ml/min (by C-G formula based on Cr of 0.7). Liver Function Tests:  Recent Labs Lab 10/28/13 1503  AST 35*  ALT 30  ALKPHOS 58  BILITOT 0.50  PROT 7.0  ALBUMIN 3.7   CBC:  Recent Labs Lab 10/28/13 1503  WBC 9.9  NEUTROABS 7.2*  HGB 12.0  HCT 37.2  MCV 99.7  PLT 153   Anemia work up No results found for this basename: VITAMINB12, FOLATE, FERRITIN, TIBC, IRON, RETICCTPCT,  in the last 72 hours Results for MOMINA, HUNTON (MRN [id number]) as of 05/01/2013 19:17  Ref. Range 12/25/2010 14:11 01/27/2013 11:53 03/10/2013 12:57 04/30/2013 14:43  Ferritin Latest Range: 10-291 ng/mL 112 10 29 40   Microbiology No results found for this or any previous visit (from the past 240 hour(s)).  Studies:  No results found.   RADIOGRAPHIC STUDIES: TRANSABDOMINAL AND TRANSVAGINAL ULTRASOUND OF PELVIS 09/24/2007  Technique: Both transabdominal and transvaginal ultrasound  examinations of the pelvis were performed including evaluation of  the uterus, ovaries, adnexal regions, and pelvic cul-de-sac.  Comparison: 01/22/2007  Findings: The uterus is retroverted and anteflexed, causing in  orientation which extends directly awaiting the transducer on  transvaginal imaging. Uterine length is measured along the  endometrium is 7.6 cm. No uterine mass is identified.  Endometrial thickness is difficult to measure due to the  orientation of the uterus, but appears to be 11 mm.  There is a small but abnormal amount of free pelvic fluid. The  right ovary measures 5.1 x 3.1 x 2.8 cm and the left ovary measures 5.1 x 2.1 x 3.4 cm.  A complex lesion of the right ovary measuring 1.7 x 1.2 x 1.2  cm is present. This could represent a hemorrhagic cyst but is  technically nonspecific. It may simply represent a corpus luteum.  Several left ovarian cysts are present. One of these appears to  have a small  internal septations or color cysts along its margin,  and a mildly corrugated border raising the possibility of a  ruptured cyst. This measures this measures 2.1 x 1.0 x 1.6 cm. A  second simple cyst measures 2.2 x 1.2 x 1.7 cm.  IMPRESSION:  1. Small but abnormal amount of free pelvic fluid.  2. Left-sided ovarian cysts, one of which appears to have a small  marginal cysts or septation as well as a corrugated border raising  the possibility of a ruptured cyst. The second cyst appears  simple.  3. More complex appearing lesion in the right ovary probably  represents a corpus luteum.   ASSESSMENT: Mackenzie [name] 38 y.o. female with a history of Iron deficiency anemia, unspecified - Plan: CBC with Differential, Ferritin, Iron and TIBC [facility], CBC with Differential, Ferritin, Iron and TIBC [facility], Comprehensive metabolic panel (Cmet) - [facility], Lactate dehydrogenase (LDH) - [facility]  PSEUDOTUMOR CEREBRI  MORBID OBESITY, HX OF  Dysmenorrhea  BLINDNESS  Bariatric surgery status   PLAN:   1. Iron-defiency anemia.  --Likely secondary to Roux-en-Y (DeFilipp et al, Surg Obes Relat Dis, 2013, Jan-Feb; 9(1): 129-132 and/or anemia secondary to menstruation which is more common in younger women.  -- Latest labs are as follows: Hbg, 12 stable from12.3, RDW of 13.1. We performed an iron-deficiency anemia w/u including ferritin, TIBC which is pending. Last lab visit in 07/29/2013, her ferritin was 20 with a low % saturation of 20, therefore she received feraheme  510 mg IV x 1.  We will await iron studies today and repet in 3 months and  recheck CBC and ferritin in 6 month. DeFlipp et. Al, 2013 demonstrated IV iron replacement for persistent iron deficiency anemia after Roux-en-Y-gastric bypass was the preferred approach.   2. Follow-up.  --RTC in 6 months for repeat labs. If iron deficient, arrange for iron infusion. She was provided a handout on iron defiency anemia. She was counseled extensively on the  symptoms of anemia.   All questions were answered. The patient knows to call the clinic with any problems, questions or concerns. We can certainly see the patient much sooner if necessary.  I spent 10 minutes counseling the patient face to face. The total time spent in the appointment was 15 minutes.    Jacek Colson, MD 10/29/2013 5:36 AM

## 2013-11-01 ENCOUNTER — Telehealth: Payer: Self-pay | Admitting: Internal Medicine

## 2013-11-01 LAB — FERRITIN CHCC: Ferritin: 52 ng/ml (ref 9–269)

## 2013-11-01 LAB — IRON AND TIBC CHCC
%SAT: 26 % (ref 21–57)
Iron: 76 ug/dL (ref 41–142)
TIBC: 291 ug/dL (ref 236–444)
UIBC: 215 ug/dL (ref 120–384)

## 2013-11-01 NOTE — Telephone Encounter (Signed)
lmonvm advising the pt of her oct and jan 2016 appts

## 2013-11-04 ENCOUNTER — Encounter: Payer: Self-pay | Admitting: Family

## 2013-11-04 ENCOUNTER — Ambulatory Visit (INDEPENDENT_AMBULATORY_CARE_PROVIDER_SITE_OTHER): Payer: Medicare HMO | Admitting: Family

## 2013-11-04 VITALS — BP 110/80 | HR 77 | Ht 66.0 in | Wt 199.0 lb

## 2013-11-04 DIAGNOSIS — H543 Unqualified visual loss, both eyes: Secondary | ICD-10-CM

## 2013-11-04 DIAGNOSIS — G932 Benign intracranial hypertension: Secondary | ICD-10-CM

## 2013-11-04 DIAGNOSIS — L659 Nonscarring hair loss, unspecified: Secondary | ICD-10-CM

## 2013-11-04 DIAGNOSIS — H547 Unspecified visual loss: Secondary | ICD-10-CM

## 2013-11-04 DIAGNOSIS — Z9884 Bariatric surgery status: Secondary | ICD-10-CM

## 2013-11-04 DIAGNOSIS — R7309 Other abnormal glucose: Secondary | ICD-10-CM

## 2013-11-04 DIAGNOSIS — R739 Hyperglycemia, unspecified: Secondary | ICD-10-CM

## 2013-11-04 LAB — CBC WITH DIFFERENTIAL/PLATELET
Basophils Absolute: 0 10*3/uL (ref 0.0–0.1)
Basophils Relative: 0.3 % (ref 0.0–3.0)
Eosinophils Absolute: 0.1 10*3/uL (ref 0.0–0.7)
Eosinophils Relative: 0.8 % (ref 0.0–5.0)
HCT: 36.6 % (ref 36.0–46.0)
Hemoglobin: 12 g/dL (ref 12.0–15.0)
Lymphocytes Relative: 22.1 % (ref 12.0–46.0)
Lymphs Abs: 1.7 10*3/uL (ref 0.7–4.0)
MCHC: 32.7 g/dL (ref 30.0–36.0)
MCV: 99 fl (ref 78.0–100.0)
Monocytes Absolute: 0.5 10*3/uL (ref 0.1–1.0)
Monocytes Relative: 6.1 % (ref 3.0–12.0)
Neutro Abs: 5.5 10*3/uL (ref 1.4–7.7)
Neutrophils Relative %: 70.7 % (ref 43.0–77.0)
Platelets: 183 10*3/uL (ref 150.0–400.0)
RBC: 3.7 Mil/uL — ABNORMAL LOW (ref 3.87–5.11)
RDW: 13 % (ref 11.5–15.5)
WBC: 7.7 10*3/uL (ref 4.0–10.5)

## 2013-11-04 LAB — COMPREHENSIVE METABOLIC PANEL
ALT: 25 U/L (ref 0–35)
AST: 29 U/L (ref 0–37)
Albumin: 4.3 g/dL (ref 3.5–5.2)
Alkaline Phosphatase: 60 U/L (ref 39–117)
BUN: 9 mg/dL (ref 6–23)
CO2: 26 mEq/L (ref 19–32)
Calcium: 8.8 mg/dL (ref 8.4–10.5)
Chloride: 103 mEq/L (ref 96–112)
Creatinine, Ser: 0.7 mg/dL (ref 0.4–1.2)
GFR: 124.91 mL/min (ref >60.00)
Glucose, Bld: 87 mg/dL (ref 70–99)
Potassium: 4.1 mEq/L (ref 3.5–5.1)
Sodium: 135 mEq/L (ref 135–145)
Total Bilirubin: 0.5 mg/dL (ref 0.2–1.2)
Total Protein: 7.8 g/dL (ref 6.0–8.3)

## 2013-11-04 LAB — HEMOGLOBIN A1C: Hgb A1c MFr Bld: 4.2 % — ABNORMAL LOW (ref 4.6–6.5)

## 2013-11-04 LAB — TSH: TSH: 0.64 u[IU]/mL (ref 0.35–4.50)

## 2013-11-04 NOTE — Progress Notes (Signed)
Subjective:    Patient ID: Mackenzie [name], female    DOB: October 13, 1975, 38 y.o.   MRN: [id number]  HPI 38 year old [place] American female, new patient to the practice and to be established. She has a history of type 2 diabetes that was controlled after having a gastric bypass in 2009. She is blind as a result of a pseudotumor cerebra. She is also having it limber in the past. Is currently disabled. Has concerns today about vitamin D, a, K., a deficiency reports being disappointed that her previous PCP did not refer her back to the surgeon. She sees gynecology for Pap smears and pelvic exams. Not currently sexually active.   Review of Systems  Constitutional: Negative.   HENT: Negative.   Respiratory: Negative.   Cardiovascular: Negative.   Gastrointestinal: Negative.   Endocrine: Negative.   Musculoskeletal: Negative.   Allergic/Immunologic: Negative.   Neurological: Negative.   Hematological: Negative.   Psychiatric/Behavioral: Negative.    Past Medical History  Diagnosis Date  . Breast lump left  . Blind 1994  . Blood transfusion 04/2010    cancer center at [facility] long - iron transfusion per pt  . Pseudotumor cerebri 1994    History - caused blind  . 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 Anemia     history  . H/O hiatal hernia     repaired with weight loss surgery  . Depression     no meds  . History of uterine fibroid     History   Social History  . Marital Status: Single    Spouse Name: N/A    Number of Children: N/A  . Years of Education: N/A   Occupational History  . Not on file.   Social History Main Topics  . Smoking status: Current Every Day Smoker -- 0.25 packs/day for 3 years    Types: Cigarettes  . Smokeless tobacco: Never Used  . Alcohol Use: Yes     Comment: occasionally  . Drug Use: No  . Sexual Activity: Not Currently   Other Topics Concern  . Not on file   Social History Narrative  . No narrative  on file    Past Surgical History  Procedure Laterality Date  . Eye decompression x3  1994    Bilateral   . Lp shunt  1994    removed in 2009  . Rurod switch  2007    weight loss surgery done in [place] (duodenal switch)  . Fetal blood transfusion  jan 29,2012  . Breast surgery  2012    right breast lumpectomy  . Appendectomy      removed with switch surgery  . Cholecystectomy      removed with switch surgery  . Myomectomy      Family History  Problem Relation Age of Onset  . Cancer Maternal Aunt     breast-passed in 2012  . COPD Mother   . Depression Mother   . Hypertension Mother   . Hyperlipidemia Mother   . Cancer Mother   . Osteoporosis Mother     No Known Allergies  Current Outpatient Prescriptions on File Prior to Visit  Medication Sig Dispense Refill  . Cholecalciferol (VITAMIN D3 PO) Take 50,000 Units by mouth 2 (two) times daily.      Marland Kitchen ibuprofen (ADVIL,MOTRIN) 800 MG tablet Take 1 tablet (800 mg total) by mouth every 8 (eight) hours as needed for cramping.  30 tablet  5  .  Multiple Vitamin (MULTIVITAMIN) capsule Take 1 capsule by mouth daily. [name] [name]      . naproxen sodium (ANAPROX) 220 MG tablet Take 440 mg by mouth as needed. For Pain.      . norethindrone-ethinyl estradiol (MICROGESTIN) 1-20 MG-MCG tablet Take 1 tablet by mouth. Days 1-20      . phytonadione (VITAMIN K) 2 MG/ML SOLN oral solution Take 1,000 mg by mouth 2 (two) times daily.      Marland Kitchen ascorbic acid (VITAMIN C) 1000 MG tablet Take 1,000 mg by mouth 2 (two) times daily.       No current facility-administered medications on file prior to visit.    BP 110/80  Pulse 77  Ht 5\' 6"  (1.676 m)  Wt 199 lb (90.266 kg)  BMI 32.13 kg/m2chart     Objective:   Physical Exam  Constitutional: She is oriented to person, place, and time. She appears well-developed and well-nourished.  HENT:  Right Ear: External ear normal.  Left Ear:  External ear normal.  Nose: Nose normal.  Mouth/Throat: Oropharynx is clear and moist.  Neck: Normal range of motion. Neck supple.  Cardiovascular: Normal rate, regular rhythm and normal heart sounds.   Pulmonary/Chest: Effort normal and breath sounds normal.  Abdominal: Soft. Bowel sounds are normal.  Musculoskeletal: Normal range of motion.  Neurological: She is alert and oriented to person, place, and time.  Skin: Skin is warm and dry.  Psychiatric: She has a normal mood and affect.          Assessment & Plan:  Mackenzie [name] was seen today for establish care.  Diagnoses and associated orders for this visit:  Blindness - CMP - TSH - Hemoglobin A1c - CBC with Differential - Vitamin D, 1,25-dihydroxy - Vitamin A - Vitamin K1, Serum  Hyperglycemia - CMP - TSH - Hemoglobin A1c - CBC with Differential - Vitamin D, 1,25-dihydroxy - Vitamin A - Vitamin K1, Serum  Pseudotumor cerebri syndrome - CMP - TSH - Hemoglobin A1c - CBC with Differential - Vitamin D, 1,25-dihydroxy - Vitamin A - Vitamin K1, Serum  Hair loss - CMP - TSH - Hemoglobin A1c - CBC with Differential - Vitamin D, 1,25-dihydroxy - Vitamin A - Vitamin K1, Serum  Gastric bypass status for obesity - CMP - TSH - Hemoglobin A1c - CBC with Differential - Vitamin D, 1,25-dihydroxy - Vitamin A - Vitamin K1, Serum   Call the office with any questions or concerns. Recheck as scheduled and as needed.

## 2013-11-04 NOTE — Patient Instructions (Signed)
Labs sent today. We'll consider referral to surgeon thereafter. Continue current medications.

## 2013-11-04 NOTE — Progress Notes (Signed)
Pre visit review using our clinic review tool, if applicable. No additional management support is needed unless otherwise documented below in the visit note. 

## 2013-11-08 LAB — VITAMIN D 1,25 DIHYDROXY
Vitamin D 1, 25 (OH)2 Total: 109 pg/mL — ABNORMAL HIGH (ref 18–72)
Vitamin D2 1, 25 (OH)2: <8 pg/mL
Vitamin D3 1, 25 (OH)2: 109 pg/mL

## 2013-11-08 LAB — VITAMIN A: Vitamin A (Retinoic Acid): 26 ug/dL — ABNORMAL LOW (ref 38–98)

## 2013-11-11 LAB — VITAMIN K1, SERUM: VITAMIN K1: <50 pg/mL — ABNORMAL LOW (ref 80–1160)

## 2013-11-16 ENCOUNTER — Other Ambulatory Visit: Payer: Self-pay | Admitting: Family

## 2013-11-16 ENCOUNTER — Telehealth: Payer: Self-pay

## 2013-11-16 DIAGNOSIS — Z9884 Bariatric surgery status: Secondary | ICD-10-CM

## 2013-11-16 NOTE — Telephone Encounter (Signed)
Padonda, [facility] will need a copy of her gastric bypass op notes before we can schedule her an apt with one of our doctors.The docs will want to look over her notes before they decide if they will take her as a patient here..She can have them faxed to [facility] Edmonia Caprio bariatric coordinator at [phone].Marland KitchenMarland KitchenThanks Thayer Headings  Left message to advise pt to fax notes to [facility]. Fax number provided and pt advised to call with questions and concerns

## 2014-01-28 ENCOUNTER — Other Ambulatory Visit: Payer: Medicare PPO

## 2014-02-01 ENCOUNTER — Telehealth: Payer: Self-pay | Admitting: Hematology

## 2014-02-01 NOTE — Telephone Encounter (Signed)
lvm fo rpt regarding to missed oct appt r/s

## 2014-02-03 ENCOUNTER — Other Ambulatory Visit (HOSPITAL_BASED_OUTPATIENT_CLINIC_OR_DEPARTMENT_OTHER): Payer: Medicare HMO

## 2014-02-03 DIAGNOSIS — D509 Iron deficiency anemia, unspecified: Secondary | ICD-10-CM

## 2014-02-03 LAB — CBC WITH DIFFERENTIAL/PLATELET
BASO%: 0.5 % (ref 0.0–2.0)
Basophils Absolute: 0 10*3/uL (ref 0.0–0.1)
EOS%: 1.3 % (ref 0.0–7.0)
Eosinophils Absolute: 0.1 10*3/uL (ref 0.0–0.5)
HCT: 34.7 % — ABNORMAL LOW (ref 34.8–46.6)
HGB: 11.1 g/dL — ABNORMAL LOW (ref 11.6–15.9)
LYMPH%: 19.1 % (ref 14.0–49.7)
MCH: 31.6 pg (ref 25.1–34.0)
MCHC: 31.9 g/dL (ref 31.5–36.0)
MCV: 99 fL (ref 79.5–101.0)
MONO#: 0.7 10*3/uL (ref 0.1–0.9)
MONO%: 8.2 % (ref 0.0–14.0)
NEUT#: 6.3 10*3/uL (ref 1.5–6.5)
NEUT%: 70.9 % (ref 38.4–76.8)
Platelets: 184 10*3/uL (ref 145–400)
RBC: 3.5 10*6/uL — ABNORMAL LOW (ref 3.70–5.45)
RDW: 12.8 % (ref 11.2–14.5)
WBC: 8.9 10*3/uL (ref 3.9–10.3)
lymph#: 1.7 10*3/uL (ref 0.9–3.3)

## 2014-02-04 LAB — IRON AND TIBC CHCC
%SAT: 11 % — ABNORMAL LOW (ref 21–57)
Iron: 38 ug/dL — ABNORMAL LOW (ref 41–142)
TIBC: 345 ug/dL (ref 236–444)
UIBC: 307 ug/dL (ref 120–384)

## 2014-02-04 LAB — FERRITIN CHCC: Ferritin: 13 ng/ml (ref 9–269)

## 2014-02-17 ENCOUNTER — Other Ambulatory Visit: Payer: Self-pay | Admitting: Hematology

## 2014-02-17 ENCOUNTER — Other Ambulatory Visit: Payer: Self-pay | Admitting: *Deleted

## 2014-02-18 ENCOUNTER — Telehealth: Payer: Self-pay | Admitting: *Deleted

## 2014-02-18 ENCOUNTER — Telehealth: Payer: Self-pay | Admitting: Hematology

## 2014-02-18 NOTE — Telephone Encounter (Signed)
LM with d/t for 10/27.

## 2014-02-18 NOTE — Telephone Encounter (Signed)
Per staff message and POF I have scheduled appts. Advised scheduler of appts. JMW  

## 2014-02-22 ENCOUNTER — Ambulatory Visit (HOSPITAL_BASED_OUTPATIENT_CLINIC_OR_DEPARTMENT_OTHER): Payer: Medicare PPO

## 2014-02-22 ENCOUNTER — Other Ambulatory Visit: Payer: Self-pay | Admitting: Hematology

## 2014-02-22 VITALS — BP 134/78 | HR 84 | Temp 98.1°F | Resp 18

## 2014-02-22 DIAGNOSIS — D509 Iron deficiency anemia, unspecified: Secondary | ICD-10-CM

## 2014-02-22 MED ORDER — SODIUM CHLORIDE 0.9 % IV SOLN
Freq: Once | INTRAVENOUS | Status: AC
  Administered 2014-02-22: 12:00:00 via INTRAVENOUS

## 2014-02-22 MED ORDER — FERUMOXYTOL INJECTION 510 MG/17 ML
1020.0000 mg | Freq: Once | INTRAVENOUS | Status: AC
  Administered 2014-02-22: 1020 mg via INTRAVENOUS
  Filled 2014-02-22: qty 34

## 2014-02-22 MED ORDER — SODIUM CHLORIDE 0.9 % IJ SOLN
3.0000 mL | Freq: Once | INTRAMUSCULAR | Status: DC | PRN
  Filled 2014-02-22: qty 10

## 2014-02-22 NOTE — Patient Instructions (Signed)

## 2014-03-11 ENCOUNTER — Encounter: Payer: Self-pay | Admitting: Family Medicine

## 2014-03-11 ENCOUNTER — Encounter: Payer: Medicare PPO | Admitting: Family Medicine

## 2014-03-11 ENCOUNTER — Ambulatory Visit (INDEPENDENT_AMBULATORY_CARE_PROVIDER_SITE_OTHER): Payer: Medicare PPO | Admitting: Family Medicine

## 2014-03-11 VITALS — BP 102/72 | HR 82 | Temp 98.4°F | Ht 66.0 in | Wt 189.7 lb

## 2014-03-11 DIAGNOSIS — R42 Dizziness and giddiness: Secondary | ICD-10-CM

## 2014-03-11 NOTE — Progress Notes (Addendum)
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   Presyncope: -started a few months ago -occ spells - starts with sensation of palpitations leading to a sensation of lightheadedness, feels like "TV static in eyes", SOB, she checks her pulse and feels like it is her heart racing, worsens if she stands up when this happens -denies: CP, SOB, DOE, acid reflux, dysphagia, abd pain -she has a hx of anxiety and but does not think this is a panic attack -belching helps it -usually occurs at rest -reports blood sugar is normal during spells -denies symptoms with activity   ROS: See pertinent positives and negatives per HPI.  Past Medical History  Diagnosis Date  . Breast lump left  . Blind 1994  . Blood transfusion 04/2010    cancer center at [facility] long - iron transfusion per pt  . Pseudotumor cerebri 1994    History - caused blind  . 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 Anemia     history  . H/O hiatal hernia     repaired with weight loss surgery  . Depression     no meds  . History of uterine fibroid     Past Surgical History  Procedure Laterality Date  . Eye decompression x3  1994    Bilateral   . Lp shunt  1994    removed in 2009  . Rurod switch  2007    weight loss surgery done in [place] (duodenal switch)  . Fetal blood transfusion  jan 29,2012  . Breast surgery  2012    right breast lumpectomy  . Appendectomy      removed with switch surgery  . Cholecystectomy      removed with switch surgery  . Myomectomy      Family History  Problem Relation Age of Onset  . Cancer Maternal Aunt     breast-passed  in 2012  . COPD Mother   . Depression Mother   . Hypertension Mother   . Hyperlipidemia Mother   . Cancer Mother   . Osteoporosis Mother     History   Social History  . Marital Status: Single    Spouse Name: N/A    Number of Children: N/A  . Years of Education: N/A   Social History Main Topics  . Smoking status: Current Every Day Smoker -- 0.25 packs/day for 3 years    Types: Cigarettes  . Smokeless tobacco: Never Used  . Alcohol Use: Yes     Comment: occasionally  . Drug Use: No  . Sexual Activity: Not Currently   Other Topics Concern  . None   Social History Narrative    Current outpatient prescriptions: ascorbic acid (VITAMIN C) 1000 MG tablet, Take 1,000 mg by mouth 2 (two) times daily., Disp: , Rfl: ;  Cholecalciferol (VITAMIN D3 PO), Take 50,000 Units by mouth 2 (two) times daily., Disp: , Rfl: ;  ibuprofen (ADVIL,MOTRIN) 800 MG tablet, Take 1 tablet (800 mg total) by mouth every 8 (eight) hours as needed for cramping., Disp: 30 tablet, Rfl: 5 Multiple Vitamin (MULTIVITAMIN) capsule, Take 1 capsule by mouth daily. Patient takes 1 tablet that contains  Vitamins ADEK. [name], Disp: , Rfl: ;  norethindrone-ethinyl estradiol (MICROGESTIN) 1-20 MG-MCG tablet, Take 1 tablet by mouth. Days 1-20, Disp: , Rfl: ;  phytonadione (VITAMIN K) 2 MG/ML SOLN oral solution, Take 1,000 mg by mouth 2 (two) times daily., Disp: , Rfl:  vitamin A 25000 UNIT capsule, Take 25,000 Units by mouth daily., Disp: , Rfl: ;  vitamin E 400 UNIT capsule, Take 400 Units by mouth 2 (two) times daily., Disp: , Rfl:   EXAM:  Filed Vitals:   03/11/14 1424  BP: 102/72  Pulse: 82  Temp: 98.4 F (36.9 C)  BP standing 108/80, pulse 84  Body mass index is 30.63 kg/(m^2).  GENERAL: vitals reviewed and listed above, alert, oriented, appears well hydrated and in no acute distress  HEENT: atraumatic, conjunttiva clear, no obvious abnormalities on inspection of external nose and ears  NECK: no  obvious masses on inspection, no carotid bruits  LUNGS: clear to auscultation bilaterally, no wheezes, rales or rhonchi, good air movement  CV: HRRR, no peripheral edema  MS: moves all extremities without noticeable abnormality  PSYCH: pleasant and cooperative, no obvious depression or anxiety  NEURO: normal gait, normal smile and wrinkling forehead, normal, tongue straight out, normal shrugging of shoulders, normal sensation in face  ASSESSMENT AND PLAN:  Discussed the following assessment and plan:  Dizziness and giddiness - Plan: EKG 12-Lead  -normal neuro exam and heart and lungs, orthostatics neg, ekg ok -we discussed possible serious and likely etiologies, workup and treatment, treatment risks and return precautions -after this discussion, Cedar opted for evaluation with cardiologist, return and emergency precuations -if cardiac eval ok consider neuro eval if persists and tx for anxiety -follow up advised with PCP in 1 month Of course, we advised Verley  to return or notify a doctor immediately if symptoms worsen or persist or new concerns arise.  -Patient advised to return or notify a doctor immediately if symptoms worsen or persist or new concerns arise.  Patient Instructions  -We placed a referral for you as discussed to the cardiologist - . It usually takes about 1-2 weeks to process and schedule this referral. If you have not heard from [place] regarding this appointment in 2 weeks please contact our office.  -seek emergency care if severe or worsening symptoms  -follow up with Padonda in 1 month  -follow up with your oncologist as well        Mackenzie [name].

## 2014-03-11 NOTE — Progress Notes (Signed)
Error   This encounter was created in error - please disregard. 

## 2014-03-11 NOTE — Progress Notes (Signed)
Pre visit review using our clinic review tool, if applicable. No additional management support is needed unless otherwise documented below in the visit note. 

## 2014-03-11 NOTE — Patient Instructions (Signed)
-  We placed a referral for you as discussed to the cardiologist - . It usually takes about 1-2 weeks to process and schedule this referral. If you have not heard from [place] regarding this appointment in 2 weeks please contact our office.  -seek emergency care if severe or worsening symptoms  -follow up with Padonda in 1 month  -follow up with your oncologist as well

## 2014-03-17 ENCOUNTER — Telehealth: Payer: Self-pay | Admitting: Family

## 2014-03-17 DIAGNOSIS — R55 Syncope and collapse: Secondary | ICD-10-CM

## 2014-03-17 DIAGNOSIS — R002 Palpitations: Secondary | ICD-10-CM

## 2014-03-17 NOTE — Telephone Encounter (Signed)
[phone] (home)  Talked with pt. Referral in. She has cut out coffee which helps but then had cocoa before this episode of palpitations last night. Feels fine today. Offered to order holter and echo in prep to see cards but she opted to not do this now. Return and emergency precautions.

## 2014-03-17 NOTE — Telephone Encounter (Addendum)
Pt following up on referral to cardiologist. Pt saw dr Darliss Ridgel, 11/13 Pt had episode last night and would like to see someone as soon as possible. Was the referral put in? I do not see

## 2014-03-18 ENCOUNTER — Ambulatory Visit (INDEPENDENT_AMBULATORY_CARE_PROVIDER_SITE_OTHER): Payer: Medicare PPO | Admitting: Cardiology

## 2014-03-18 ENCOUNTER — Encounter: Payer: Self-pay | Admitting: Cardiology

## 2014-03-18 VITALS — BP 130/75 | HR 69 | Ht 66.0 in | Wt 193.0 lb

## 2014-03-18 DIAGNOSIS — R000 Tachycardia, unspecified: Secondary | ICD-10-CM

## 2014-03-18 DIAGNOSIS — R42 Dizziness and giddiness: Secondary | ICD-10-CM

## 2014-03-18 DIAGNOSIS — R002 Palpitations: Secondary | ICD-10-CM

## 2014-03-18 NOTE — Progress Notes (Signed)
Mackenzie [name] Date of Birth:  29-May-1975 [place] [address] [place], [place]  [address] [phone]        Fax   [phone]   History of Present Illness: This pleasant 38 year old African-American woman is seen at the request of her PCP Padonda B. Megan Salon FNP.  We are asked to see her because of recent occurrence of ongoing problems with new palpitations.  The patient had an episode of brief palpitations 3 months ago which appeared to be relieved after she ate some candy.  She had no further problems until 9 days ago when while sitting in class she became lightheaded and felt like she would blackout.  She felt that her heart was racing.  Her symptoms appeared worse when she was standing in terms of dizziness and when she would lie down her symptoms would improve.  Since then she has had intermittent episodes of brief aeration.  She notes that belching appears to help with the palpitations.  She also notes that caffeine makes them worse.  She also notes polyuria during and after the rapid heart action.  2 nights ago she awoke with chest pressure and rapid heart action which lasted about 2 hours.  She does not have any history of exertional chest discomfort. Her past medical history reveals that at the age of 76 she had pseudotumor cerebri.  This was not diagnosed in time and it caused her to lose her eyesight because of pressure on the optic nerve.  She is now legally blind and can barely see light and dark.  Shortly after she had the pseudotumor cerebri she had Lonia Blood syndrome.  The pseudotumor cerebri was treated with a ventriculoperitoneal shunt .  The shunt was removed in 2009.  She has a past history of morbid obesity and in 2008 underwent weight loss surgery.  She weighed 313 pounds and now weighs 193 pounds.  The surgery was done in [place].  She states that as a result of the weight loss surgery she has malabsorption of vitamins and is now being  followed at the [facility] for iron infusions as necessary.  She had her first infusion in April and a second infusion in October. Her family history is negative for tachycardia. 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  Current Outpatient Prescriptions  Medication Sig Dispense Refill  . ascorbic acid (VITAMIN C) 1000 MG tablet Take 1,000 mg by mouth 2 (two) times daily.    . Cholecalciferol (VITAMIN D3 PO) Take 50,000 Units by mouth 2 (two) times daily.    Marland Kitchen ibuprofen (ADVIL,MOTRIN) 800 MG tablet Take 1 tablet (800 mg total) by mouth every 8 (eight) hours as needed for cramping. 30 tablet 5  . Multiple Vitamin (MULTIVITAMIN) capsule Take 1 capsule by mouth daily. [name] [name]    . norethindrone-ethinyl estradiol (MICROGESTIN) 1-20 MG-MCG tablet Take 1 tablet by mouth. Days 1-20    . phytonadione (VITAMIN K) 2 MG/ML SOLN oral solution Take 1,000 mg by mouth 2 (two) times daily.    . vitamin A 25000 UNIT capsule Take 25,000 Units by mouth daily.    . vitamin E 400 UNIT capsule Take 400 Units by mouth 2 (two) times daily.     No current facility-administered medications for this visit.    No Known Allergies  Patient Active Problem List   Diagnosis Date Noted  .  Folliculitis 26/71/2458  . Dysmenorrhea 07/01/2013  . Iron deficiency anemia 01/27/2013  . Left breast mass 11/02/2010  . HEADACHE 07/16/2007  . DIABETES MELLITUS 07/07/2007  . PSEUDOTUMOR CEREBRI 07/07/2007  . BLINDNESS 07/07/2007  . VENTRAL HERNIA 07/07/2007  . OTHER ASCITES 07/07/2007  . GUILLAIN-BARRE SYNDROME, HX OF 07/07/2007  . MORBID OBESITY, HX OF 07/07/2007  . EUSTACHIAN TUBE DYSFUNCTION, RIGHT 06/16/2007  . BARIATRIC SURGERY STATUS 06/16/2007  . DEPRESSION 03/31/2007    History  Smoking status  . Former Smoker -- 0.25 packs/day for 3 years  . Types: Cigarettes  . Start date: 03/09/2014  Smokeless  tobacco  . Never Used    History  Alcohol Use  . 0.0 oz/week  . 0 Not specified per week    Comment: occasionally    Family History  Problem Relation Age of Onset  . Cancer Maternal Aunt     breast-passed in 2012  . COPD Mother   . Depression Mother   . Hypertension Mother   . Hyperlipidemia Mother   . Cancer Mother   . Osteoporosis Mother   . Heart attack Father   . Stroke Paternal Grandmother   . Stroke Paternal Grandfather     Review of Systems: Constitutional: no fever chills diaphoresis or fatigue or change in weight.  Head and neck: no hearing loss, no epistaxis, no photophobia or visual disturbance. Respiratory: No cough, shortness of breath or wheezing. Cardiovascular: No chest pain peripheral edema, positive for palpitations Gastrointestinal: No abdominal distention, no abdominal pain, no change in bowel habits hematochezia or melena. Genitourinary: No dysuria, no frequency, no urgency, no nocturia. Musculoskeletal:No arthralgias, no back pain, no gait disturbance or myalgias. Neurological: No dizziness, no headaches, no numbness, no seizures, no syncope, no weakness, no tremors. Hematologic: No lymphadenopathy, no easy bruising. Psychiatric: No confusion, no hallucinations, no sleep disturbance.    Physical Exam: Filed Vitals:   03/18/14 1521  BP: 130/75  Pulse: 69  The patient appears to be in no distress.  Head and neck exam reveals that the patient is legally blind and cannot see basically anything.  Mouth and pharynx are benign.  No lymphadenopathy.  No carotid bruits.  The jugular venous pressure is normal.  Thyroid is not enlarged or tender.  Chest is clear to percussion and auscultation.  No rales or rhonchi.  Expansion of the chest is symmetrical.  Heart reveals no abnormal lift or heave.  First and second heart sounds are normal.  There is no murmur gallop rub or click.  The abdomen is soft and nontender.  Bowel sounds are normoactive.  There is  no hepatosplenomegaly or mass.  There are no abdominal bruits.  Extremities reveal no phlebitis or edema.  Pedal pulses are good.  There is no cyanosis or clubbing.  Neurologic exam is normal strength and no lateralizing weakness.  No sensory deficits.  Integument reveals no rash  EKG from 03/11/14 shows normal sinus rhythm with nonspecific T-wave flattening.  The QTc interval was normal.  Assessment / Plan: 1.  Palpitations which are suggestive of paroxysmal atrial tachycardia.  The episodes have been increasing in frequency and are associated with lightheadedness and weakness.  Caffeine seems to make them worse.  Belching and lying down seemed to make them better. 2.  History of pseudotumor cerebri leading to total blindness at age 58. 69.  Past history of morbid obesity status post weight loss surgery in 2008.  She now has problems with malabsorption of vitamins.  Disposition: We will have  the patient get a chest x-ray.  We will obtain a two-dimensional echocardiogram.  We will also have her wear a 3 week event monitor to try to document the precise type of arrhythmia that she is experiencing.  Once we identified the arrhythmia, we can consider pharmacologic therapy. Many thanks for the opportunity to see this pleasant woman with you.  We will keep you posted as to the results of her tests.

## 2014-03-18 NOTE — Patient Instructions (Signed)
A chest x-ray takes a picture of the organs and structures inside the chest, including the heart, lungs, and blood vessels. This test can show several things, including, whether the heart is enlarges; whether fluid is building up in the lungs; and whether pacemaker / defibrillator leads are still in place.  Your physician has requested that you have an echocardiogram. Echocardiography is a painless test that uses sound waves to create images of your heart. It provides your doctor with information about the size and shape of your heart and how well your heart's chambers and valves are working. This procedure takes approximately one hour. There are no restrictions for this procedure.  Your physician has recommended that you wear an event monitor. Event monitors are medical devices that record the heart's electrical activity. Doctors most often [place] these monitors to diagnose arrhythmias. Arrhythmias are problems with the speed or rhythm of the heartbeat. The monitor is a small, portable device. You can wear one while you do your normal daily activities. This is usually used to diagnose what is causing palpitations/syncope (passing out).  Follow up to be determined after testing. No follow up needed at this time.

## 2014-03-18 NOTE — Addendum Note (Signed)
Addended by: Darlin Coco on: 03/18/2014 06:20 PM   Modules accepted: Level of Service

## 2014-03-22 ENCOUNTER — Emergency Department (HOSPITAL_COMMUNITY): Payer: Medicare PPO

## 2014-03-22 ENCOUNTER — Emergency Department (HOSPITAL_COMMUNITY)
Admission: EM | Admit: 2014-03-22 | Discharge: 2014-03-22 | Disposition: A | Discharge status: Home / Self Care | Payer: Medicare PPO | Attending: Emergency Medicine | Admitting: Emergency Medicine

## 2014-03-22 ENCOUNTER — Encounter (HOSPITAL_COMMUNITY): Payer: Self-pay | Admitting: Emergency Medicine

## 2014-03-22 DIAGNOSIS — R0789 Other chest pain: Secondary | ICD-10-CM | POA: Diagnosis not present

## 2014-03-22 DIAGNOSIS — Z8659 Personal history of other mental and behavioral disorders: Secondary | ICD-10-CM | POA: Diagnosis not present

## 2014-03-22 DIAGNOSIS — Z8742 Personal history of other diseases of the female genital tract: Secondary | ICD-10-CM | POA: Diagnosis not present

## 2014-03-22 DIAGNOSIS — H540 Blindness, both eyes: Secondary | ICD-10-CM | POA: Insufficient documentation

## 2014-03-22 DIAGNOSIS — Z9889 Other specified postprocedural states: Secondary | ICD-10-CM | POA: Diagnosis not present

## 2014-03-22 DIAGNOSIS — Z87891 Personal history of nicotine dependence: Secondary | ICD-10-CM | POA: Insufficient documentation

## 2014-03-22 DIAGNOSIS — F419 Anxiety disorder, unspecified: Secondary | ICD-10-CM | POA: Diagnosis not present

## 2014-03-22 DIAGNOSIS — Z862 Personal history of diseases of the blood and blood-forming organs and certain disorders involving the immune mechanism: Secondary | ICD-10-CM | POA: Insufficient documentation

## 2014-03-22 DIAGNOSIS — R079 Chest pain, unspecified: Secondary | ICD-10-CM

## 2014-03-22 DIAGNOSIS — Z79899 Other long term (current) drug therapy: Secondary | ICD-10-CM | POA: Insufficient documentation

## 2014-03-22 LAB — CBC WITH DIFFERENTIAL/PLATELET
Basophils Absolute: 0 10*3/uL (ref 0.0–0.1)
Basophils Relative: 0 % (ref 0–1)
Eosinophils Absolute: 0 10*3/uL (ref 0.0–0.7)
Eosinophils Relative: 0 % (ref 0–5)
HCT: 37.3 % (ref 36.0–46.0)
Hemoglobin: 12 g/dL (ref 12.0–15.0)
Lymphocytes Relative: 25 % (ref 12–46)
Lymphs Abs: 1.8 10*3/uL (ref 0.7–4.0)
MCH: 31.9 pg (ref 26.0–34.0)
MCHC: 32.2 g/dL (ref 30.0–36.0)
MCV: 99.2 fL (ref 78.0–100.0)
Monocytes Absolute: 0.5 10*3/uL (ref 0.1–1.0)
Monocytes Relative: 7 % (ref 3–12)
Neutro Abs: 5.1 10*3/uL (ref 1.7–7.7)
Neutrophils Relative %: 68 % (ref 43–77)
Platelets: 147 10*3/uL — ABNORMAL LOW (ref 150–400)
RBC: 3.76 MIL/uL — ABNORMAL LOW (ref 3.87–5.11)
RDW: 13.8 % (ref 11.5–15.5)
WBC: 7.4 10*3/uL (ref 4.0–10.5)

## 2014-03-22 LAB — BASIC METABOLIC PANEL
Anion gap: 11 (ref 5–15)
BUN: 12 mg/dL (ref 6–23)
CO2: 25 mEq/L (ref 19–32)
Calcium: 8.7 mg/dL (ref 8.4–10.5)
Chloride: 102 mEq/L (ref 96–112)
Creatinine, Ser: 0.63 mg/dL (ref 0.50–1.10)
GFR calc Af Amer: >90 mL/min (ref >90)
GFR calc non Af Amer: >90 mL/min (ref >90)
Glucose, Bld: 73 mg/dL (ref 70–99)
Potassium: 3.7 mEq/L (ref 3.7–5.3)
Sodium: 138 mEq/L (ref 137–147)

## 2014-03-22 LAB — I-STAT TROPONIN, ED: Troponin i, poc: 0 ng/mL (ref 0.00–0.08)

## 2014-03-22 MED ORDER — ASPIRIN 81 MG PO CHEW: 324.0000 mg | CHEWABLE_TABLET | Freq: Once | ORAL | Status: DC

## 2014-03-22 MED ORDER — OMEPRAZOLE 20 MG PO CPDR: 20.0000 mg | DELAYED_RELEASE_CAPSULE | Freq: Every day | ORAL | Status: DC

## 2014-03-22 MED ORDER — LORAZEPAM 1 MG PO TABS: 1.0000 mg | ORAL_TABLET | Freq: Three times a day (TID) | ORAL | Status: DC | PRN

## 2014-03-22 MED ORDER — GI COCKTAIL ~~LOC~~
30.0000 mL | Freq: Once | ORAL | Status: AC
  Administered 2014-03-22: 30 mL via ORAL
  Filled 2014-03-22: qty 30

## 2014-03-22 MED ORDER — LORAZEPAM 2 MG/ML IJ SOLN
1.0000 mg | Freq: Once | INTRAMUSCULAR | Status: AC
  Administered 2014-03-22: 1 mg via INTRAVENOUS
  Filled 2014-03-22: qty 1

## 2014-03-22 NOTE — ED Provider Notes (Signed)
CSN: [id number]     Arrival date & time 03/22/14  1202 History   First MD Initiated Contact with Patient 03/22/14 1223     Chief Complaint  Patient presents with  . Chest Pain     (Consider location/radiation/quality/duration/timing/severity/associated sxs/prior Treatment) HPI Comments: Patient presents emergency department with chief complaints of chest pain. She states that the symptoms started around 10 AM this morning. She states that they came on while she was in class. She reports associated palpitations and dizziness. She denies any associated shortness of breath. She has been having these episodes frequently for the past 2 weeks. She has been seen by primary care as well as by Dr. Mare Ferrari of cardiology, who plans to have the patient wear a Holter monitor starting on Monday.  Patient thinks the symptoms are related to caffeine intake.  She states that she had a special K bars had some chocolate in it.  Patient has been given aspirin prior to arrival.  She states that the symptoms improved with belching.  The history is provided by the patient. No language interpreter was used.    Past Medical History  Diagnosis Date  . Breast lump left  . Blind 1994  . Blood transfusion 04/2010    cancer center at [facility] - iron transfusion per pt  . Pseudotumor cerebri 1994    History - caused blind  . 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 Anemia     history  . H/O hiatal hernia     repaired with weight loss surgery  . Depression     no meds  . History of uterine fibroid    Past Surgical History  Procedure Laterality Date  . Eye decompression x3  1994    Bilateral   . Lp shunt  1994    removed in 2009  . Rurod switch  2007    weight loss surgery done in [place] (duodenal switch)  . Fetal blood transfusion  jan 29,2012  . Breast surgery  2012    right breast lumpectomy  . Appendectomy      removed with switch surgery  . Cholecystectomy       removed with switch surgery  . Myomectomy     Family History  Problem Relation Age of Onset  . Cancer Maternal Aunt     breast-passed in 2012  . COPD Mother   . Depression Mother   . Hypertension Mother   . Hyperlipidemia Mother   . Cancer Mother   . Osteoporosis Mother   . Heart attack Father   . Stroke Paternal Grandmother   . Stroke Paternal Grandfather    History  Substance Use Topics  . Smoking status: Former Smoker -- 0.25 packs/day for 3 years    Types: Cigarettes    Start date: 03/09/2014  . Smokeless tobacco: Never Used  . Alcohol Use: 0.0 oz/week    0 Not specified per week     Comment: occasionally   OB History    Gravida Para Term Preterm AB TAB SAB Ectopic Multiple Living   0 0 0 0 0 0 0 0 0 0      Review of Systems  Constitutional: Negative for fever and chills.  Eyes:       Blind  Respiratory: Negative for shortness of breath.   Cardiovascular: Positive for chest pain.  Gastrointestinal: Negative for nausea, vomiting, diarrhea and constipation.  Genitourinary: Negative for dysuria.  All other systems  reviewed and are negative.     Allergies  Review of patient's allergies indicates no known allergies.  Home Medications   Prior to Admission medications   Medication Sig Start Date End Date Taking? Authorizing Provider  ascorbic acid (VITAMIN C) 1000 MG tablet Take 1,000 mg by mouth 2 (two) times daily.    Historical Provider, MD  Cholecalciferol (VITAMIN D3 PO) Take 50,000 Units by mouth 2 (two) times daily.    Historical Provider, MD  ibuprofen (ADVIL,MOTRIN) 800 MG tablet Take 1 tablet (800 mg total) by mouth every 8 (eight) hours as needed for cramping. 07/01/13   Shelly Bombard, MD  Multiple Vitamin (MULTIVITAMIN) capsule Take 1 capsule by mouth daily. [name] [name]    Historical Provider, MD  norethindrone-ethinyl estradiol (MICROGESTIN) 1-20 MG-MCG tablet Take 1 tablet by mouth.  Days 1-20 07/26/12   Historical Provider, MD  phytonadione (VITAMIN K) 2 MG/ML SOLN oral solution Take 1,000 mg by mouth 2 (two) times daily.    Historical Provider, MD  vitamin A 25000 UNIT capsule Take 25,000 Units by mouth daily.    Historical Provider, MD  vitamin E 400 UNIT capsule Take 400 Units by mouth 2 (two) times daily.    Historical Provider, MD   BP 124/78 mmHg  Pulse 67  Resp 18  Ht 5\' 6"  (1.676 m)  Wt 193 lb (87.544 kg)  BMI 31.17 kg/m2  SpO2 100%  LMP 03/04/2014 (Exact Date) Physical Exam  Constitutional: She is oriented to person, place, and time. She appears well-developed and well-nourished.  Anxious appearing  HENT:  Head: Normocephalic and atraumatic.  Eyes: Conjunctivae and EOM are normal. Pupils are equal, round, and reactive to light.  blind  Neck: Normal range of motion. Neck supple.  Cardiovascular: Normal rate and regular rhythm.  Exam reveals no gallop and no friction rub.   No murmur heard. Pulmonary/Chest: Effort normal and breath sounds normal. No respiratory distress. She has no wheezes. She has no rales. She exhibits tenderness.  Mild chest tenderness to palpation  Abdominal: Soft. Bowel sounds are normal. She exhibits no distension and no mass. There is no tenderness. There is no rebound and no guarding.  Musculoskeletal: Normal range of motion. She exhibits no edema or tenderness.  Neurological: She is alert and oriented to person, place, and time.  Skin: Skin is warm and dry.  Psychiatric: She has a normal mood and affect. Her behavior is normal. Judgment and thought content normal.  Nursing note and vitals reviewed.   ED Course  Procedures (including critical care time) Results for orders placed or performed during the hospital encounter of 03/22/14  CBC with Differential  Result Value Ref Range   WBC 7.4 4.0 - 10.5 K/uL   RBC 3.76 (L) 3.87 - 5.11 MIL/uL   Hemoglobin 12.0 12.0 - 15.0 g/dL   HCT 37.3 36.0 - 46.0 %   MCV 99.2 78.0 - 100.0 fL    MCH 31.9 26.0 - 34.0 pg   MCHC 32.2 30.0 - 36.0 g/dL   RDW 13.8 11.5 - 15.5 %   Platelets 147 (L) 150 - 400 K/uL   Neutrophils Relative % 68 43 - 77 %   Neutro Abs 5.1 1.7 - 7.7 K/uL   Lymphocytes Relative 25 12 - 46 %   Lymphs Abs 1.8 0.7 - 4.0 K/uL   Monocytes Relative 7 3 - 12 %   Monocytes Absolute 0.5 0.1 - 1.0 K/uL  Eosinophils Relative 0 0 - 5 %   Eosinophils Absolute 0.0 0.0 - 0.7 K/uL   Basophils Relative 0 0 - 1 %   Basophils Absolute 0.0 0.0 - 0.1 K/uL  Basic metabolic panel  Result Value Ref Range   Sodium 138 137 - 147 mEq/L   Potassium 3.7 3.7 - 5.3 mEq/L   Chloride 102 96 - 112 mEq/L   CO2 25 19 - 32 mEq/L   Glucose, Bld 73 70 - 99 mg/dL   BUN 12 6 - 23 mg/dL   Creatinine, Ser 0.63 0.50 - 1.10 mg/dL   Calcium 8.7 8.4 - 10.5 mg/dL   GFR calc non Af Amer >90 >90 mL/min   GFR calc Af Amer >90 >90 mL/min   Anion gap 11 5 - 15  I-stat troponin, ED  Result Value Ref Range   Troponin i, poc 0.00 0.00 - 0.08 ng/mL   Comment 3           Dg Chest 2 View  03/22/2014   CLINICAL DATA:  Chest pain and cardiac flutter  EXAM: CHEST  2 VIEW  COMPARISON:  January 09, 2005  FINDINGS: There is no edema or consolidation. Heart size and pulmonary vascularity are normal. No adenopathy. There are surgical clips in the left upper abdomen. No bone lesions. No pneumothorax.  IMPRESSION: No edema or consolidation.   Electronically Signed   By: Lowella Grip M.D.   On: 03/22/2014 13:35     Imaging Review No results found.   EKG Interpretation   Date/Time:  Tuesday March 22 2014 12:10:13 EST Ventricular Rate:  69 PR Interval:  173 QRS Duration: 83 QT Interval:  424 QTC Calculation: 454 R Axis:   63 Text Interpretation:  Sinus rhythm No old tracing to compare Confirmed by  [facility]  MD, ELLIOTT [phone]) on 03/22/2014 3:29:02 PM      MDM   Final diagnoses:  Chest pain    Patient with chest pain and palpitations. Symptoms mostly resolved, but patient is quite  anxious appearing. She is also belching. We'll check basic labs, treat symptoms, and will reassess. Consider cardiology consult, given that she is established and is in the middle of a treatment.  3:48 PM Patient states that she feels better.  Belching improved with GI cocktail.  No EKG abnormality or arrhythmia on monitor.  Labs and workup are reassuring.  Plan for discharge to home.  Patient discussed with Dr. Eulis Foster, who agrees with the plan.    Montine Circle, PA-C 03/22/14 [place], MD 03/22/14 [phone]

## 2014-03-22 NOTE — ED Notes (Signed)
Per ems, pt w/feeling of palpitations and dizziness at 10am, now resolved.  Pt reports multiple recent episodes of same, saw cards on last Friday, will be fitted for halter monitor on Monday.  Pt states she is currently pain free, did receive 324 aspirin pta

## 2014-03-22 NOTE — Discharge Instructions (Signed)

## 2014-03-28 ENCOUNTER — Encounter: Payer: Self-pay | Admitting: *Deleted

## 2014-03-28 ENCOUNTER — Telehealth: Payer: Self-pay | Admitting: Cardiology

## 2014-03-28 ENCOUNTER — Ambulatory Visit (INDEPENDENT_AMBULATORY_CARE_PROVIDER_SITE_OTHER): Payer: Medicare PPO | Admitting: *Deleted

## 2014-03-28 ENCOUNTER — Ambulatory Visit (HOSPITAL_COMMUNITY): Payer: Medicare PPO | Attending: Cardiovascular Disease | Admitting: Cardiology

## 2014-03-28 DIAGNOSIS — E119 Type 2 diabetes mellitus without complications: Secondary | ICD-10-CM | POA: Diagnosis not present

## 2014-03-28 DIAGNOSIS — E669 Obesity, unspecified: Secondary | ICD-10-CM | POA: Diagnosis not present

## 2014-03-28 DIAGNOSIS — R000 Tachycardia, unspecified: Secondary | ICD-10-CM

## 2014-03-28 DIAGNOSIS — R002 Palpitations: Secondary | ICD-10-CM

## 2014-03-28 DIAGNOSIS — Z87891 Personal history of nicotine dependence: Secondary | ICD-10-CM | POA: Diagnosis not present

## 2014-03-28 DIAGNOSIS — R42 Dizziness and giddiness: Secondary | ICD-10-CM

## 2014-03-28 NOTE — Progress Notes (Signed)
Patient ID: Mackenzie [name], female   DOB: 1975-09-12, 38 y.o.   MRN: [id number] [employer] verite 21 day cardiac event monitor applied to patient.

## 2014-03-28 NOTE — Telephone Encounter (Signed)
Patient had echo and event monitor Will await results

## 2014-03-28 NOTE — Telephone Encounter (Signed)
New Msg   Patient went to the hospital on Tuesday and would like for her chart to be checked. States there was a test administered on Tuesday that needs to be reviewed. Patient has an appt today at 2:00 pm and will discuss more upon arrival.

## 2014-03-28 NOTE — Progress Notes (Signed)
Echo performed. 

## 2014-03-28 NOTE — Progress Notes (Signed)
Patient ID: Mackenzie [name], female   DOB: 1976/01/28, 38 y.o.   MRN: [id number]

## 2014-04-07 ENCOUNTER — Telehealth: Payer: Self-pay | Admitting: Hematology

## 2014-04-07 NOTE — Telephone Encounter (Signed)
[facility]) came to check on who pt will be seeing due to [facility] reached out to her re some vitamin therapy pt will need at her next visit. pt on schedule for CP1 05/05/14. appt moved from CP1 to [place]. also added poss inf to 05/05/14 appt and for 05/12/14 due to per 10/28/13 pof pt needed inf 11mo and 66mo - feraheme inf now given day1 and day8. lmonvm for pt and mailed schedule.

## 2014-04-15 ENCOUNTER — Encounter: Payer: Self-pay | Admitting: Family

## 2014-04-15 ENCOUNTER — Ambulatory Visit (INDEPENDENT_AMBULATORY_CARE_PROVIDER_SITE_OTHER): Payer: Medicare PPO | Admitting: Family

## 2014-04-15 VITALS — BP 102/62 | HR 84 | Wt 197.4 lb

## 2014-04-15 DIAGNOSIS — R42 Dizziness and giddiness: Secondary | ICD-10-CM

## 2014-04-15 DIAGNOSIS — H547 Unspecified visual loss: Secondary | ICD-10-CM

## 2014-04-15 DIAGNOSIS — G932 Benign intracranial hypertension: Secondary | ICD-10-CM

## 2014-04-15 DIAGNOSIS — F430 Acute stress reaction: Secondary | ICD-10-CM

## 2014-04-15 DIAGNOSIS — H540 Blindness, both eyes: Secondary | ICD-10-CM

## 2014-04-15 DIAGNOSIS — K219 Gastro-esophageal reflux disease without esophagitis: Secondary | ICD-10-CM

## 2014-04-15 MED ORDER — CLONAZEPAM 0.5 MG PO TABS: 0.5000 mg | ORAL_TABLET | Freq: Every day | ORAL | Status: DC | PRN

## 2014-04-15 MED ORDER — OMEPRAZOLE 40 MG PO CPDR: 40.0000 mg | DELAYED_RELEASE_CAPSULE | Freq: Every day | ORAL | Status: DC

## 2014-04-15 NOTE — Progress Notes (Signed)
Subjective:    Patient ID: Mackenzie [name], female    DOB: 1976/03/19, 38 y.o.   MRN: [id number]  HPI 38 year old African-American femaleemale, nonsmoker with a history of pseudotumor cerebri, blind, is in for recheck after seeing Dr. Maudie Mercury on 03/11/2014 for dizziness. At that time, she was encouraged to go to the emergency department if her symptoms worsen. Patient developed chest pain on 03/18/2014 with severe dizziness and was seen and diagnosed with having palpitations that are thought to be related to stress. She's currently on a Holter monitor per cardiology. She has stopped school for this semester that she feels with stressing her. Today, she no longer has palpitations anxiety or chest pain. Chest pain was attributed to GERD and she's been taking Prilosec that it helped her symptoms. However, she continues to burp excessively. She was given up her prescription of Ativan 1 mg to help with anxiety that makes her sleepy. However, she takes a half tablet at bedtime to get a good night rest. She has followed up with oncology with regards to pseudotumor cerebri   Review of Systems  Constitutional: Negative.   HENT: Negative.   Eyes: Negative.        Blind  Respiratory: Negative.   Cardiovascular: Negative.   Gastrointestinal: Negative.   Endocrine: Negative.   Genitourinary: Negative.   Musculoskeletal: Negative.   Skin: Negative.   Allergic/Immunologic: Negative.   Neurological: Negative for dizziness, syncope, weakness and headaches.       Pseudotumor cerebri  Psychiatric/Behavioral: Negative.    Past Medical History  Diagnosis Date  . Breast lump left  . Blind 1994  . Blood transfusion 04/2010    cancer center at [facility] - iron transfusion per pt  . Pseudotumor cerebri 1994    History - caused blind  . 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 Anemia     history  . H/O hiatal hernia     repaired with weight loss surgery  . Depression    no meds  . History of uterine fibroid     History   Social History  . Marital Status: Single    Spouse Name: N/A    Number of Children: N/A  . Years of Education: N/A   Occupational History  . Not on file.   Social History Main Topics  . Smoking status: Former Smoker -- 0.25 packs/day for 3 years    Types: Cigarettes    Start date: 03/09/2014  . Smokeless tobacco: Never Used  . Alcohol Use: 0.0 oz/week    0 Not specified per week     Comment: occasionally  . Drug Use: No  . Sexual Activity: Not Currently   Other Topics Concern  . Not on file   Social History Narrative    Past Surgical History  Procedure Laterality Date  . Eye decompression x3  1994    Bilateral   . Lp shunt  1994    removed in 2009  . Rurod switch  2007    weight loss surgery done in [place] (duodenal switch)  . Fetal blood transfusion  jan 29,2012  . Breast surgery  2012    right breast lumpectomy  . Appendectomy      removed with switch surgery  . Cholecystectomy      removed with switch surgery  . Myomectomy      Family History  Problem Relation Age of Onset  . Cancer Maternal Aunt  breast-passed in 2012  . COPD Mother   . Depression Mother   . Hypertension Mother   . Hyperlipidemia Mother   . Cancer Mother   . Osteoporosis Mother   . Heart attack Father   . Stroke Paternal Grandmother   . Stroke Paternal Grandfather     Allergies  Allergen Reactions  . Influenza Vaccines     Hx Guillain Barre Syndrome    Current Outpatient Prescriptions on File Prior to Visit  Medication Sig Dispense Refill  . Calcium Carbonate (CALTRATE 600 PO) Take 2 tablets by mouth 2 (two) times daily.    . Cholecalciferol (VITAMIN D3 PO) Take 50,000 Units by mouth 2 (two) times daily.    Marland Kitchen ibuprofen (ADVIL,MOTRIN) 800 MG tablet Take 1 tablet (800 mg total) by mouth every 8 (eight) hours as needed for cramping. 30 tablet 5  . Multiple Vitamin (MULTIVITAMIN) capsule Take 1 capsule by mouth  daily. [name] [name]    . norethindrone-ethinyl estradiol (MICROGESTIN) 1-20 MG-MCG tablet Take 1 tablet by mouth. Days 1-20    . phytonadione (VITAMIN K) 2 MG/ML SOLN oral solution Take 1,000 mg by mouth 2 (two) times daily.    . Probiotic Product (PROBIOTIC PO) Take 1 capsule by mouth daily.    . vitamin A 25000 UNIT capsule Take 25,000 Units by mouth daily.    . vitamin E 400 UNIT capsule Take 400 Units by mouth 2 (two) times daily.     No current facility-administered medications on file prior to visit.    BP 102/62 mmHg  Pulse 84  Wt 197 lb 6.4 oz (89.54 kg)  LMP 03/04/2014 (Exact Date)chart    Objective:   Physical Exam  Constitutional: She is oriented to person, place, and time. She appears well-developed and well-nourished.  HENT:  Right Ear: External ear normal.  Left Ear: External ear normal.  Nose: Nose normal.  Mouth/Throat: Oropharynx is clear and moist.  Neck: Normal range of motion. Neck supple. No thyromegaly present.  Cardiovascular: Normal rate, regular rhythm, normal heart sounds and intact distal pulses.   Pulmonary/Chest: Effort normal and breath sounds normal.  Abdominal: Soft. Bowel sounds are normal.  Musculoskeletal: Normal range of motion.  Neurological: She is alert and oriented to person, place, and time.  Skin: Skin is warm and dry.  Psychiatric: She has a normal mood and affect.          Assessment & Plan:  Mackenzie [name] was seen today for follow-up.  Diagnoses and associated orders for this visit:  Gastroesophageal reflux disease without esophagitis  Stress reaction  Pseudotumor cerebri  Blind  Dizziness and giddiness  Other Orders - omeprazole (PRILOSEC) 40 MG capsule; Take 1 capsule (40 mg total) by mouth daily. - clonazePAM (KLONOPIN) 0.5 MG tablet; Take 1 tablet (0.5 mg total) by mouth daily as needed for anxiety.    GERD friendly diet. Discussed in detail. Continue  Prilosec but will increase to 40 mg once daily to hopefully help with burping. Follow up with cardiology as scheduled. See oncologist as scheduled as well. Discontinue Ativan due to sedation. Start Klonopin 0.5 mg as needed only. Advised that if she needs the medication daily, we will have to consider an SSRI. Recheck in 4 months and sooner as needed.

## 2014-04-15 NOTE — Progress Notes (Signed)
Pre visit review using our clinic review tool, if applicable. No additional management support is needed unless otherwise documented below in the visit note. 

## 2014-04-15 NOTE — Patient Instructions (Signed)

## 2014-05-03 ENCOUNTER — Telehealth: Payer: Self-pay | Admitting: *Deleted

## 2014-05-03 NOTE — Telephone Encounter (Signed)
Left message to call back   Dr. Mare Ferrari reviewed 21 day event monitor  Interpretation: Sinus Rhythm

## 2014-05-04 ENCOUNTER — Other Ambulatory Visit: Payer: Self-pay | Admitting: *Deleted

## 2014-05-04 DIAGNOSIS — D509 Iron deficiency anemia, unspecified: Secondary | ICD-10-CM

## 2014-05-04 NOTE — Telephone Encounter (Signed)
Advised patient

## 2014-05-05 ENCOUNTER — Other Ambulatory Visit: Payer: Self-pay | Admitting: Hematology

## 2014-05-05 ENCOUNTER — Ambulatory Visit (HOSPITAL_BASED_OUTPATIENT_CLINIC_OR_DEPARTMENT_OTHER): Payer: Medicare PPO | Admitting: Hematology

## 2014-05-05 ENCOUNTER — Ambulatory Visit (HOSPITAL_BASED_OUTPATIENT_CLINIC_OR_DEPARTMENT_OTHER): Payer: Medicare PPO

## 2014-05-05 ENCOUNTER — Other Ambulatory Visit (HOSPITAL_BASED_OUTPATIENT_CLINIC_OR_DEPARTMENT_OTHER): Payer: Medicare PPO

## 2014-05-05 ENCOUNTER — Encounter: Payer: Self-pay | Admitting: Hematology

## 2014-05-05 VITALS — BP 132/91 | HR 90 | Temp 98.1°F | Resp 18 | Ht 66.0 in | Wt 200.8 lb

## 2014-05-05 DIAGNOSIS — D508 Other iron deficiency anemias: Secondary | ICD-10-CM

## 2014-05-05 DIAGNOSIS — D509 Iron deficiency anemia, unspecified: Secondary | ICD-10-CM

## 2014-05-05 DIAGNOSIS — E876 Hypokalemia: Secondary | ICD-10-CM

## 2014-05-05 DIAGNOSIS — D520 Dietary folate deficiency anemia: Secondary | ICD-10-CM

## 2014-05-05 DIAGNOSIS — D529 Folate deficiency anemia, unspecified: Secondary | ICD-10-CM

## 2014-05-05 DIAGNOSIS — Z9884 Bariatric surgery status: Secondary | ICD-10-CM

## 2014-05-05 LAB — CBC WITH DIFFERENTIAL/PLATELET
BASO%: 0.3 % (ref 0.0–2.0)
Basophils Absolute: 0 10*3/uL (ref 0.0–0.1)
EOS%: 0.4 % (ref 0.0–7.0)
Eosinophils Absolute: 0 10*3/uL (ref 0.0–0.5)
HCT: 33.4 % — ABNORMAL LOW (ref 34.8–46.6)
HGB: 10.7 g/dL — ABNORMAL LOW (ref 11.6–15.9)
LYMPH%: 13.1 % — ABNORMAL LOW (ref 14.0–49.7)
MCH: 32.8 pg (ref 25.1–34.0)
MCHC: 32 g/dL (ref 31.5–36.0)
MCV: 102.4 fL — ABNORMAL HIGH (ref 79.5–101.0)
MONO#: 0.5 10*3/uL (ref 0.1–0.9)
MONO%: 5.8 % (ref 0.0–14.0)
NEUT#: 6.3 10*3/uL (ref 1.5–6.5)
NEUT%: 80.4 % — ABNORMAL HIGH (ref 38.4–76.8)
Platelets: 175 10*3/uL (ref 145–400)
RBC: 3.26 10*6/uL — ABNORMAL LOW (ref 3.70–5.45)
RDW: 14 % (ref 11.2–14.5)
WBC: 7.8 10*3/uL (ref 3.9–10.3)
lymph#: 1 10*3/uL (ref 0.9–3.3)

## 2014-05-05 LAB — COMPREHENSIVE METABOLIC PANEL (CC13)
ALT: 13 U/L (ref 0–55)
AST: 18 U/L (ref 5–34)
Albumin: 3.9 g/dL (ref 3.5–5.0)
Alkaline Phosphatase: 49 U/L (ref 40–150)
Anion Gap: 8 mEq/L (ref 3–11)
BUN: 8.8 mg/dL (ref 7.0–26.0)
CO2: 23 mEq/L (ref 22–29)
Calcium: 8.1 mg/dL — ABNORMAL LOW (ref 8.4–10.4)
Chloride: 108 mEq/L (ref 98–109)
Creatinine: 0.7 mg/dL (ref 0.6–1.1)
EGFR: >90 mL/min/{1.73_m2} (ref >90)
Glucose: 116 mg/dl (ref 70–140)
Potassium: 3.1 mEq/L — ABNORMAL LOW (ref 3.5–5.1)
Sodium: 139 mEq/L (ref 136–145)
Total Bilirubin: 0.77 mg/dL (ref 0.20–1.20)
Total Protein: 6.7 g/dL (ref 6.4–8.3)

## 2014-05-05 LAB — IRON AND TIBC CHCC
%SAT: 19 % — ABNORMAL LOW (ref 21–57)
Iron: 54 ug/dL (ref 41–142)
TIBC: 284 ug/dL (ref 236–444)
UIBC: 230 ug/dL (ref 120–384)

## 2014-05-05 LAB — LACTATE DEHYDROGENASE (CC13): LDH: 172 U/L (ref 125–245)

## 2014-05-05 LAB — FERRITIN CHCC: Ferritin: 61 ng/ml (ref 9–269)

## 2014-05-05 MED ORDER — SODIUM CHLORIDE 0.9 % IV SOLN
Freq: Once | INTRAVENOUS | Status: AC
  Administered 2014-05-05: 14:00:00 via INTRAVENOUS

## 2014-05-05 MED ORDER — DEXTROSE 5 % IV SOLN
INTRAVENOUS | Status: DC
  Administered 2014-05-05: 15:00:00 via INTRAVENOUS
  Filled 2014-05-05: qty 1000

## 2014-05-05 MED ORDER — POTASSIUM CHLORIDE 10 MEQ/100ML IV SOLN: 10.0000 meq | INTRAVENOUS | Status: DC

## 2014-05-05 MED ORDER — SODIUM CHLORIDE 0.9 % IV SOLN
510.0000 mg | Freq: Once | INTRAVENOUS | Status: AC
  Administered 2014-05-05: 510 mg via INTRAVENOUS
  Filled 2014-05-05: qty 17

## 2014-05-05 MED ORDER — SODIUM CHLORIDE 0.9 % IV SOLN: 1.0000 g | Freq: Once | INTRAVENOUS | Status: DC

## 2014-05-05 NOTE — Addendum Note (Signed)
Addended by: Truitt Merle on: 05/05/2014 02:10 PM   Modules accepted: Orders

## 2014-05-05 NOTE — Addendum Note (Signed)
Addended by: Enis Gash on: 05/05/2014 02:08 PM   Modules accepted: Orders

## 2014-05-05 NOTE — Patient Instructions (Signed)

## 2014-05-05 NOTE — Progress Notes (Signed)
[facility] OFFICE PROGRESS NOTE  CAMPBELL, Mackenzie Danker, FNP [address] [place] [address]  DIAGNOSIS: Iron deficiency anemia - Plan: Folate RBC, Vitamin B12  Anemia, macrocytic, nutritional  Chief Complaint  Patient presents with  . Follow-up  . Anemia    CURRENT THERAPY:  Feraheme 510 mg , every 3 months , as needed  INTERVAL HISTORY: Mackenzie [name] 39 y.o. female with a history of blindness, obesity s/p gastric bypass, who presents along with her mother for follow-up for her iron deficiency anemia. She was last seen by Dr. Juliann [name] 3 month ago.   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 She also had a hiistory of morbid obesity and had attempted to have bariatric surgery in [place] by Dr. Lorin [name]. Medical records indicated she had a duodenal switch procedure done in 2007. Her anemia, she reports, has worsened since her gastric bypass surgery.   She returns for follow up. She feels well overall. She was seen by Dr.   Briscoe [name]  At [facility], and was found to have low Vit A, D and K levels. She has heavy periods, back on BCP since Nov 2015, with some improvement. It lasts about 5 days, she changes diaper 6 times a day, for about 4 days. No other new complains.  She has mild fatigue, which has been stable.  She otherwise doing well.  MEDICAL HISTORY: Past Medical History  Diagnosis Date  . Breast lump left  . Blind 1994  . Blood transfusion 04/2010    cancer center at [facility] long - iron transfusion per pt  . Pseudotumor cerebri 1994    History - caused blind  . 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 Anemia     history  . H/O hiatal hernia     repaired with weight loss surgery   . Depression     no meds  . History of uterine fibroid     ALLERGIES:  is allergic to influenza vaccines.  MEDICATIONS: has a current medication list which includes the following prescription(s): calcium carbonate, cholecalciferol, clonazepam, ibuprofen, multivitamin, norethindrone-ethinyl estradiol, omeprazole, phytonadione, probiotic product, vitamin a, and vitamin e.  SURGICAL HISTORY:  Past Surgical History  Procedure Laterality Date  . Eye decompression x3  1994    Bilateral   . Lp shunt  1994    removed in 2009  . Rurod switch  2007    weight loss surgery done in [place] (duodenal switch)  . Fetal blood transfusion  jan 29,2012  . Breast surgery  2012    right breast lumpectomy  . Appendectomy      removed with switch surgery  . Cholecystectomy      removed with switch surgery  . Myomectomy      REVIEW OF SYSTEMS:   Constitutional: Denies fevers, chills or abnormal weight loss,  Mild fatigue Eyes: Denies blurriness of vision Ears, nose, mouth, throat, and face: Denies mucositis or sore throat Respiratory: Denies cough, dyspnea or wheezes Cardiovascular: Denies palpitation, chest discomfort or lower extremity swelling Gastrointestinal:  Denies nausea, heartburn or change in bowel habits Skin: Denies abnormal skin rashes Lymphatics: Denies new lymphadenopathy or easy bruising Neurological:Denies numbness, tingling or new weaknesses Behavioral/Psych: Mood is stable, no new changes  All other systems were reviewed with the patient and are negative.  PHYSICAL EXAMINATION: ECOG PERFORMANCE STATUS: 0 - Asymptomatic  Blood pressure 132/91, pulse 90, temperature 98.1 F (36.7 C), temperature source Oral, resp. rate 18, height 5\' 6"  (1.676 m), weight 200 lb 12.8 oz (91.082 kg), SpO2 100 %.  GENERAL:alert, no distress and comfortable; blind, alopecia and obese SKIN: skin color, texture, turgor are normal, no rashes or significant lesions EYES: normal, Conjunctiva are  pink and non-injected, sclera clear OROPHARYNX:no exudate, no erythema and lips, buccal mucosa, and tongue normal  NECK: supple, thyroid normal size, non-tender, without nodularity LYMPH:  no palpable lymphadenopathy in the cervical, axillary or supraclavicular LUNGS: clear to auscultation and percussion with normal breathing effort HEART: regular rate & rhythm and no murmurs and no lower extremity edema ABDOMEN:abdomen soft, non-tender and normal bowel sounds Musculoskeletal:no cyanosis of digits and no clubbing  NEURO: alert & oriented x 3 with fluent speech, no focal motor/sensory deficits   LABORATORY DATA: CBC Latest Ref Rng 05/05/2014 03/22/2014 02/03/2014  WBC 3.9 - 10.3 10e3/uL 7.8 7.4 8.9  Hemoglobin 11.6 - 15.9 g/dL 10.7(L) 12.0 11.1(L)  Hematocrit 34.8 - 46.6 % 33.4(L) 37.3 34.7(L)  Platelets 145 - 400 10e3/uL 175 147(L) 184   MCV 102.4 today   CMP Latest Ref Rng 05/05/2014 03/22/2014 11/04/2013  Glucose 70 - 140 mg/dl 116 73 87  BUN 7.0 - 26.0 mg/dL 8.8 12 9   Creatinine 0.6 - 1.1 mg/dL 0.7 0.63 0.7  Sodium 136 - 145 mEq/L 139 138 135  Potassium 3.5 - 5.1 mEq/L 3.1(L) 3.7 4.1  Chloride 96 - 112 mEq/L - 102 103  CO2 22 - 29 mEq/L 23 25 26   Calcium 8.4 - 10.4 mg/dL 8.1(L) 8.7 8.8  Total Protein 6.4 - 8.3 g/dL 6.7 - 7.8  Total Bilirubin 0.20 - 1.20 mg/dL 0.77 - 0.5  Alkaline Phos 40 - 150 U/L 49 - 60  AST 5 - 34 U/L 18 - 29  ALT 0 - 55 U/L 13 - 25     Anemia work up No results for input(s): VITAMINB12, FOLATE, FERRITIN, TIBC, IRON, RETICCTPCT in the last 72 hours. Results for Mackenzie, [name] (MRN [id number]) as of 05/01/2013 19:17  Ref. Range 12/25/2010 14:11 01/27/2013 11:53 03/10/2013 12:57 04/30/2013 14:43  Ferritin Latest Range: 10-291 ng/mL 112 10 29 40   Microbiology No results found for this or any previous visit (from the past 240 hour(s)).  Studies:  No results found.   RADIOGRAPHIC STUDIES: TRANSABDOMINAL AND TRANSVAGINAL ULTRASOUND OF PELVIS 09/24/2007   Technique: Both transabdominal and transvaginal ultrasound  examinations of the pelvis were performed including evaluation of  the uterus, ovaries, adnexal regions, and pelvic cul-de-sac.  Comparison: 01/22/2007  Findings: The uterus is retroverted and anteflexed, causing in  orientation which extends directly awaiting the transducer on  transvaginal imaging. Uterine length is measured along the  endometrium is 7.6 cm. No uterine mass is identified.  Endometrial thickness is difficult to measure due to the  orientation of the uterus, but appears to be 11 mm.  There is a small but abnormal amount of free pelvic fluid. The  right ovary measures 5.1 x 3.1 x 2.8 cm and the left ovary measures 5.1 x 2.1 x 3.4 cm.  A complex lesion of the right ovary measuring 1.7 x 1.2 x 1.2 cm is present. This could represent a hemorrhagic cyst but is  technically nonspecific. It may simply represent a corpus luteum.  Several left ovarian cysts are present.  One of these appears to  have a small internal septations or color cysts along its margin,  and a mildly corrugated border raising the possibility of a  ruptured cyst. This measures this measures 2.1 x 1.0 x 1.6 cm. A  second simple cyst measures 2.2 x 1.2 x 1.7 cm.  IMPRESSION:  1. Small but abnormal amount of free pelvic fluid.  2. Left-sided ovarian cysts, one of which appears to have a small  marginal cysts or septation as well as a corrugated border raising  the possibility of a ruptured cyst. The second cyst appears  simple.  3. More complex appearing lesion in the right ovary probably  represents a corpus luteum.   ASSESSMENT: Mackenzie [name] 39 y.o. female with a history of Iron deficiency anemia - Plan: Folate RBC, Vitamin B12  Anemia, macrocytic, nutritional   PLAN:   1. Nutritional anemia. - she was diagnosed with iron deficient anemia secondary to her gastric bypass surgery , responded well to IV iron. - she now has macrocytic  anemia,  Likely secondary to B12 deficiency from her gastric bypass surgery. I'll check her serum folic acid and P71 level today. If his low,  We'll start her on B12 injection. - her today's ferritin level is still pending, she is more anemic today partially because of her  Menorrhagia last week.  We'll give on Feraheme 510 dear grams today  2.  Other vitamin deficiency - I'll obtain her vitamin A, D, K level from Dr.  [facility]  [facility] - Will discuss with our pharmacy to see if we can replace her vitamins through IV.  3.  Hypokalemia and hypocalcemia - hypocalcemia could be related to her vitamin D deficiency. - I'll replace her potassium and calcium through IV today - follow-up her level on next visit   She will continue to follow-up with her primary care physician for  other medical issues.  --RTC in 3 months for repeat labs one week before.  All questions were answered. The patient knows to call the clinic with any problems, questions or concerns. We can certainly see the patient much sooner if necessary.  I spent 15 minutes counseling the patient face to face. The total time spent in the appointment was 20 minutes.    Truitt Merle, MD 05/05/2014 1:46 PM

## 2014-05-06 ENCOUNTER — Telehealth: Payer: Self-pay | Admitting: *Deleted

## 2014-05-06 ENCOUNTER — Other Ambulatory Visit: Payer: Self-pay | Admitting: *Deleted

## 2014-05-06 DIAGNOSIS — E876 Hypokalemia: Secondary | ICD-10-CM

## 2014-05-06 LAB — FOLATE RBC: RBC Folate: 549 ng/mL (ref >280)

## 2014-05-06 LAB — VITAMIN B12: Vitamin B-12: 527 pg/mL (ref 211–911)

## 2014-05-06 MED ORDER — POTASSIUM CHLORIDE CRYS ER 20 MEQ PO TBCR: 20.0000 meq | EXTENDED_RELEASE_TABLET | Freq: Every day | ORAL | Status: DC

## 2014-05-06 NOTE — Telephone Encounter (Signed)
Received call from pt stating that she thought Dr. Burr Medico was going to call in a script for K+ to her pharmacy but she called them & they don't have a script.  Message left for Dr Burr Medico to call in script if she wants her to have it.

## 2014-05-06 NOTE — Telephone Encounter (Signed)
Notified pt per Dr Burr Medico that she does want her to take K+ 20 meq daily & e-scribed to pharmacy.  She will probably start tomorrow & will come early next thurs for repeat lab.

## 2014-05-09 ENCOUNTER — Telehealth: Payer: Self-pay | Admitting: Hematology

## 2014-05-12 ENCOUNTER — Ambulatory Visit (HOSPITAL_BASED_OUTPATIENT_CLINIC_OR_DEPARTMENT_OTHER): Payer: Medicare PPO

## 2014-05-12 ENCOUNTER — Other Ambulatory Visit (HOSPITAL_BASED_OUTPATIENT_CLINIC_OR_DEPARTMENT_OTHER): Payer: Medicare PPO

## 2014-05-12 DIAGNOSIS — D509 Iron deficiency anemia, unspecified: Secondary | ICD-10-CM

## 2014-05-12 DIAGNOSIS — E876 Hypokalemia: Secondary | ICD-10-CM

## 2014-05-12 LAB — BASIC METABOLIC PANEL (CC13)
Anion Gap: 7 mEq/L (ref 3–11)
BUN: 12.4 mg/dL (ref 7.0–26.0)
CO2: 23 mEq/L (ref 22–29)
Calcium: 8.3 mg/dL — ABNORMAL LOW (ref 8.4–10.4)
Chloride: 107 mEq/L (ref 98–109)
Creatinine: 0.9 mg/dL (ref 0.6–1.1)
EGFR: >90 mL/min/{1.73_m2} (ref >90)
Glucose: 95 mg/dl (ref 70–140)
Potassium: 3.8 mEq/L (ref 3.5–5.1)
Sodium: 137 mEq/L (ref 136–145)

## 2014-05-12 MED ORDER — SODIUM CHLORIDE 0.9 % IV SOLN
510.0000 mg | Freq: Once | INTRAVENOUS | Status: AC
  Administered 2014-05-12: 510 mg via INTRAVENOUS
  Filled 2014-05-12: qty 17

## 2014-05-12 MED ORDER — SODIUM CHLORIDE 0.9 % IV SOLN
Freq: Once | INTRAVENOUS | Status: AC
  Administered 2014-05-12: 15:00:00 via INTRAVENOUS

## 2014-05-12 NOTE — Patient Instructions (Signed)

## 2014-07-04 ENCOUNTER — Ambulatory Visit: Payer: Medicare PPO | Admitting: Obstetrics

## 2014-08-10 ENCOUNTER — Telehealth: Payer: Self-pay | Admitting: *Deleted

## 2014-08-10 ENCOUNTER — Telehealth: Payer: Self-pay | Admitting: Hematology

## 2014-08-10 NOTE — Telephone Encounter (Signed)
Patient confirm appointment for April. Sent message to schedule treatment.

## 2014-08-10 NOTE — Telephone Encounter (Signed)
Mackenzie [name] called reporting she "has not received follow up visit with Dr. Burr Medico or an injection appointment.  I know I should be seen every three months with feraheme due to S/P surgery and I can die from this.  I am concerned about my Vitamin, A, D, E and potassium.  I stopped the potassium because it made me feel weird.  I started eating bananas and feel better not taking potassium.  Monday I felt dizzy and tired so I thought I'd better call."    Noted seen on 05-05-2014 so she is past due for three month F/u & has not received.  Ferritin on 05-05-2014 = 61.  Vitamin B12= 527.  wil send new P.O.F. And notify provider.    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  I'm available this Thursday afternoon or anytime Friday."  Advised that we'll see what is available and will notify provider to determine what labs are needed.  Last Vitamin A & D checked July 2015.  No Vitamin E noted in EPIC records.

## 2014-08-10 NOTE — Telephone Encounter (Signed)
Per staff message and POF I have scheduled appts. Advised scheduler of appts and to move labs. JMW  

## 2014-08-11 ENCOUNTER — Ambulatory Visit: Payer: Medicare PPO

## 2014-08-11 ENCOUNTER — Other Ambulatory Visit: Payer: Self-pay | Admitting: *Deleted

## 2014-08-11 DIAGNOSIS — D509 Iron deficiency anemia, unspecified: Secondary | ICD-10-CM

## 2014-08-12 ENCOUNTER — Ambulatory Visit: Payer: Medicare PPO

## 2014-08-12 ENCOUNTER — Other Ambulatory Visit: Payer: Medicare PPO

## 2014-08-12 ENCOUNTER — Other Ambulatory Visit: Payer: Self-pay | Admitting: Hematology

## 2014-08-12 ENCOUNTER — Other Ambulatory Visit: Payer: Self-pay | Admitting: *Deleted

## 2014-08-12 DIAGNOSIS — D509 Iron deficiency anemia, unspecified: Secondary | ICD-10-CM

## 2014-08-12 NOTE — Telephone Encounter (Signed)
I will order labs, please schedule her lab one week before her visit with me. Schedule her to see me sometime this month, first available.   Truitt Merle MD  08/12/2014

## 2014-08-16 ENCOUNTER — Other Ambulatory Visit: Payer: Medicare PPO

## 2014-08-18 ENCOUNTER — Other Ambulatory Visit (HOSPITAL_BASED_OUTPATIENT_CLINIC_OR_DEPARTMENT_OTHER): Payer: Medicare HMO

## 2014-08-18 DIAGNOSIS — D509 Iron deficiency anemia, unspecified: Secondary | ICD-10-CM | POA: Diagnosis not present

## 2014-08-18 LAB — CBC WITH DIFFERENTIAL/PLATELET
BASO%: 0.2 % (ref 0.0–2.0)
Basophils Absolute: 0 10*3/uL (ref 0.0–0.1)
EOS%: 0.8 % (ref 0.0–7.0)
Eosinophils Absolute: 0.1 10*3/uL (ref 0.0–0.5)
HCT: 34.2 % — ABNORMAL LOW (ref 34.8–46.6)
HGB: 10.9 g/dL — ABNORMAL LOW (ref 11.6–15.9)
LYMPH%: 24.2 % (ref 14.0–49.7)
MCH: 33.1 pg (ref 25.1–34.0)
MCHC: 31.9 g/dL (ref 31.5–36.0)
MCV: 104 fL — ABNORMAL HIGH (ref 79.5–101.0)
MONO#: 0.6 10*3/uL (ref 0.1–0.9)
MONO%: 6.4 % (ref 0.0–14.0)
NEUT#: 6.3 10*3/uL (ref 1.5–6.5)
NEUT%: 68.4 % (ref 38.4–76.8)
Platelets: 224 10*3/uL (ref 145–400)
RBC: 3.29 10*6/uL — ABNORMAL LOW (ref 3.70–5.45)
RDW: 12.8 % (ref 11.2–14.5)
WBC: 9.3 10*3/uL (ref 3.9–10.3)
lymph#: 2.2 10*3/uL (ref 0.9–3.3)

## 2014-08-18 LAB — IRON AND TIBC CHCC
%SAT: 21 % (ref 21–57)
Iron: 55 ug/dL (ref 41–142)
TIBC: 266 ug/dL (ref 236–444)
UIBC: 212 ug/dL (ref 120–384)

## 2014-08-18 LAB — COMPREHENSIVE METABOLIC PANEL (CC13)
ALT: 24 U/L (ref 0–55)
AST: 25 U/L (ref 5–34)
Albumin: 4.1 g/dL (ref 3.5–5.0)
Alkaline Phosphatase: 65 U/L (ref 40–150)
Anion Gap: 12 mEq/L — ABNORMAL HIGH (ref 3–11)
BUN: 9.4 mg/dL (ref 7.0–26.0)
CO2: 22 mEq/L (ref 22–29)
Calcium: 8.2 mg/dL — ABNORMAL LOW (ref 8.4–10.4)
Chloride: 106 mEq/L (ref 98–109)
Creatinine: 0.7 mg/dL (ref 0.6–1.1)
EGFR: >90 mL/min/{1.73_m2} (ref >90)
Glucose: 91 mg/dl (ref 70–140)
Potassium: 3.8 mEq/L (ref 3.5–5.1)
Sodium: 139 mEq/L (ref 136–145)
Total Bilirubin: 0.58 mg/dL (ref 0.20–1.20)
Total Protein: 7.1 g/dL (ref 6.4–8.3)

## 2014-08-18 LAB — FERRITIN CHCC: Ferritin: 241 ng/ml (ref 9–269)

## 2014-08-19 ENCOUNTER — Ambulatory Visit (HOSPITAL_BASED_OUTPATIENT_CLINIC_OR_DEPARTMENT_OTHER): Payer: Medicare HMO | Admitting: Hematology

## 2014-08-19 ENCOUNTER — Ambulatory Visit (HOSPITAL_BASED_OUTPATIENT_CLINIC_OR_DEPARTMENT_OTHER): Payer: Medicare HMO

## 2014-08-19 VITALS — BP 124/86 | HR 81

## 2014-08-19 VITALS — BP 116/65 | HR 79 | Temp 98.4°F | Resp 19 | Ht 66.0 in | Wt 198.3 lb

## 2014-08-19 DIAGNOSIS — D539 Nutritional anemia, unspecified: Secondary | ICD-10-CM

## 2014-08-19 DIAGNOSIS — D509 Iron deficiency anemia, unspecified: Secondary | ICD-10-CM

## 2014-08-19 DIAGNOSIS — D538 Other specified nutritional anemias: Secondary | ICD-10-CM

## 2014-08-19 DIAGNOSIS — E568 Deficiency of other vitamins: Secondary | ICD-10-CM

## 2014-08-19 LAB — VITAMIN B12: Vitamin B-12: 897 pg/mL (ref 211–911)

## 2014-08-19 LAB — FOLATE RBC: RBC Folate: 491 ng/mL (ref >280)

## 2014-08-19 MED ORDER — SODIUM CHLORIDE 0.9 % IV SOLN
510.0000 mg | Freq: Once | INTRAVENOUS | Status: AC
  Administered 2014-08-19: 510 mg via INTRAVENOUS
  Filled 2014-08-19: qty 17

## 2014-08-19 MED ORDER — SODIUM CHLORIDE 0.9 % IV SOLN
Freq: Once | INTRAVENOUS | Status: AC
  Administered 2014-08-19: 16:00:00 via INTRAVENOUS

## 2014-08-19 NOTE — Progress Notes (Signed)
[place] OFFICE PROGRESS NOTE  CAMPBELL, Hillard Danker, FNP [address] [place] [address]  DIAGNOSIS: No diagnosis found.  Chief Complaint  Patient presents with  . Mackenzie  . Follow-up    CURRENT THERAPY:  Feraheme , every 3 months , as needed  INTERVAL HISTORY: ANYIA GIERKE 39 y.o. female with a history of blindness, obesity s/p gastric bypass, who presents along with her mother for follow-up for her iron deficiency Mackenzie. She was last seen by Dr. Juliann Mule 3 month ago.   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 She also had a hiistory of morbid obesity and had attempted to have bariatric surgery in [place] by Dr. Lorin Picket. Medical records indicated she had a duodenal switch procedure done in 2007. Her Mackenzie, she reports, has worsened since her gastric bypass surgery.   She complains about fatigue lately, and sleeps about 11 hours at night. She did feel much better with iv feraheme last time, and her period is still very heavy. She in on BCP, and her period is slightly better. She is going to follow with her gyn. She is going to follow Dr.   Briscoe Deutscher  At [facility] and check her vit levels.     MEDICAL HISTORY: Past Medical History  Diagnosis Date  . Breast lump left  . Blind 1994  . Blood transfusion 04/2010    cancer center at [facility] long - iron transfusion per pt  . Pseudotumor cerebri 1994    History - caused blind  . 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 Mackenzie     history  . H/O hiatal hernia     repaired with weight loss surgery  . Depression     no meds  . History of uterine fibroid     ALLERGIES:  is allergic to influenza vaccines.  MEDICATIONS: has a  current medication list which includes the following prescription(s): calcium carbonate, cholecalciferol, ibuprofen, multivitamin, norethindrone-ethinyl estradiol, omeprazole, phytonadione, vitamin a, vitamin e, clonazepam, and potassium chloride sa.  SURGICAL HISTORY:  Past Surgical History  Procedure Laterality Date  . Eye decompression x3  1994    Bilateral   . Lp shunt  1994    removed in 2009  . Rurod switch  2007    weight loss surgery done in [place] (duodenal switch)  . Fetal blood transfusion  jan 29,2012  . Breast surgery  2012    right breast lumpectomy  . Appendectomy      removed with switch surgery  . Cholecystectomy      removed with switch surgery  . Myomectomy      REVIEW OF SYSTEMS:   Constitutional: Denies fevers, chills or abnormal weight loss,  Mild fatigue Eyes: Denies blurriness of vision Ears, nose, mouth, throat, and face: Denies mucositis or sore throat Respiratory: Denies cough, dyspnea or wheezes Cardiovascular: Denies palpitation, chest discomfort or lower extremity swelling Gastrointestinal:  Denies nausea, heartburn or change in bowel habits Skin: Denies abnormal skin rashes Lymphatics: Denies new lymphadenopathy or easy bruising Neurological:Denies numbness, tingling or new weaknesses Behavioral/Psych: Mood is stable, no new changes  All other systems were reviewed with the patient and are negative.  PHYSICAL EXAMINATION: ECOG PERFORMANCE STATUS: 0 - Asymptomatic  Blood pressure  116/65, pulse 79, temperature 98.4 F (36.9 C), temperature source Oral, resp. rate 19, height 5\' 6"  (1.676 m), weight 198 lb 4.8 oz (89.948 kg), SpO2 99 %.  GENERAL:alert, no distress and comfortable; blind, alopecia and obese SKIN: skin color, texture, turgor are normal, no rashes or significant lesions EYES: normal, Conjunctiva are pink and non-injected, sclera clear OROPHARYNX:no exudate, no erythema and lips, buccal mucosa, and tongue normal  NECK: supple,  thyroid normal size, non-tender, without nodularity LYMPH:  no palpable lymphadenopathy in the cervical, axillary or supraclavicular LUNGS: clear to auscultation and percussion with normal breathing effort HEART: regular rate & rhythm and no murmurs and no lower extremity edema ABDOMEN:abdomen soft, non-tender and normal bowel sounds Musculoskeletal:no cyanosis of digits and no clubbing  NEURO: alert & oriented x 3 with fluent speech, no focal motor/sensory deficits   LABORATORY DATA: CBC Latest Ref Rng 08/18/2014 05/05/2014 03/22/2014  WBC 3.9 - 10.3 10e3/uL 9.3 7.8 7.4  Hemoglobin 11.6 - 15.9 g/dL 10.9(L) 10.7(L) 12.0  Hematocrit 34.8 - 46.6 % 34.2(L) 33.4(L) 37.3  Platelets 145 - 400 10e3/uL 224 175 147(L)   MCV 102.4 today   CMP Latest Ref Rng 08/18/2014 05/12/2014 05/05/2014  Glucose 70 - 140 mg/dl 91 95 116  BUN 7.0 - 26.0 mg/dL 9.4 12.4 8.8  Creatinine 0.6 - 1.1 mg/dL 0.7 0.9 0.7  Sodium 136 - 145 mEq/L 139 137 139  Potassium 3.5 - 5.1 mEq/L 3.8 3.8 3.1(L)  Chloride 96 - 112 mEq/L - - -  CO2 22 - 29 mEq/L 22 23 23   Calcium 8.4 - 10.4 mg/dL 8.2(L) 8.3(L) 8.1(L)  Total Protein 6.4 - 8.3 g/dL 7.1 - 6.7  Total Bilirubin 0.20 - 1.20 mg/dL 0.58 - 0.77  Alkaline Phos 40 - 150 U/L 65 - 49  AST 5 - 34 U/L 25 - 18  ALT 0 - 55 U/L 24 - 13     Mackenzie work up  Recent Labs  08/18/14 1331 08/18/14 1332  VITAMINB12  --  897  FERRITIN 241  --   TIBC 266  --   IRON 55  --      RADIOGRAPHIC STUDIES: TRANSABDOMINAL AND TRANSVAGINAL ULTRASOUND OF PELVIS 09/24/2007  Technique: Both transabdominal and transvaginal ultrasound  examinations of the pelvis were performed including evaluation of  the uterus, ovaries, adnexal regions, and pelvic cul-de-sac.  Comparison: 01/22/2007  Findings: The uterus is retroverted and anteflexed, causing in  orientation which extends directly awaiting the transducer on  transvaginal imaging. Uterine length is measured along the  endometrium is 7.6 cm.  No uterine mass is identified.  Endometrial thickness is difficult to measure due to the  orientation of the uterus, but appears to be 11 mm.  There is a small but abnormal amount of free pelvic fluid. The  right ovary measures 5.1 x 3.1 x 2.8 cm and the left ovary measures 5.1 x 2.1 x 3.4 cm.  A complex lesion of the right ovary measuring 1.7 x 1.2 x 1.2 cm is present. This could represent a hemorrhagic cyst but is  technically nonspecific. It may simply represent a corpus luteum.  Several left ovarian cysts are present. One of these appears to  have a small internal septations or color cysts along its margin,  and a mildly corrugated border raising the possibility of a  ruptured cyst. This measures this measures 2.1 x 1.0 x 1.6 cm. A  second simple cyst measures 2.2 x 1.2 x 1.7 cm.  IMPRESSION:  1. Small but  abnormal amount of free pelvic fluid.  2. Left-sided ovarian cysts, one of which appears to have a small  marginal cysts or septation as well as a corrugated border raising  the possibility of a ruptured cyst. The second cyst appears  simple.  3. More complex appearing lesion in the right ovary probably  represents a corpus luteum.   ASSESSMENT: QUIANA COBAUGH 39 y.o. female with a history of iron deficient Mackenzie  PLAN:   1. Nutritional Mackenzie. - she was diagnosed with iron deficient Mackenzie secondary to her gastric bypass surgery , responded well to IV iron. - she now has macrocytic Mackenzie,  but the W29 and folic acid level are normal - Her recent ferritin level is normal at 241, however given the ongoing heavy, and her moderate fatigue, I'll give her 1 dose of Feraheme 110 mg today. -Her hemoglobin is 10.9, did not correct completely by IV iron. She likely has as a component of Mackenzie, possible nutritional secondary to her gastric bypass surgery -We'll check her Burt Knack and zinc level -Follow her CBC and iron studies every 3 months   2.  Other vitamin deficiency - I'll  obtain her vitamin A, D, K level from Dr.  [facility]  Office - Will discuss with our pharmacy to see if we can replace her vitamins through IV.  3. Hypocalcemia - hypocalcemia could be related to her vitamin D deficiency, she'll continue oral calcium and vitamin D. -Her hypokalemia has resolved - follow-up her level on next visit   She will continue to follow-up with her primary care physician for  other medical issues.  --RTC in 3 months for repeat labs one week before.  All questions were answered. The patient knows to call the clinic with any problems, questions or concerns. We can certainly see the patient much sooner if necessary.  I spent 20 minutes counseling the patient face to face. The total time spent in the appointment was 20 minutes.    Truitt Merle, MD 08/19/2014 3:28 PM

## 2014-08-19 NOTE — Patient Instructions (Signed)
Anemia, Nonspecific Anemia is a condition in which the concentration of red blood cells or hemoglobin in the blood is below normal. Hemoglobin is a substance in red blood cells that carries oxygen to the tissues of the body. Anemia results in not enough oxygen reaching these tissues.  CAUSES  Common causes of anemia include:   Excessive bleeding. Bleeding may be internal or external. This includes excessive bleeding from periods (in women) or from the intestine.   Poor nutrition.   Chronic kidney, thyroid, and liver disease.  Bone marrow disorders that decrease red blood cell production.  Cancer and treatments for cancer.  HIV, AIDS, and their treatments.  Spleen problems that increase red blood cell destruction.  Blood disorders.  Excess destruction of red blood cells due to infection, medicines, and autoimmune disorders. SIGNS AND SYMPTOMS   Minor weakness.   Dizziness.   Headache.  Palpitations.   Shortness of breath, especially with exercise.   Paleness.  Cold sensitivity.  Indigestion.  Nausea.  Difficulty sleeping.  Difficulty concentrating. Symptoms may occur suddenly or they may develop slowly.  DIAGNOSIS  Additional blood tests are often needed. These help your health care provider determine the best treatment. Your health care provider will check your stool for blood and look for other causes of blood loss.  TREATMENT  Treatment varies depending on the cause of the anemia. Treatment can include:   Supplements of iron, vitamin B12, or folic acid.   Hormone medicines.   A blood transfusion. This may be needed if blood loss is severe.   Hospitalization. This may be needed if there is significant continual blood loss.   Dietary changes.  Spleen removal. HOME CARE INSTRUCTIONS Keep all follow-up appointments. It often takes many weeks to correct anemia, and having your health care provider check on your condition and your response to  treatment is very important. SEEK IMMEDIATE MEDICAL CARE IF:   You develop extreme weakness, shortness of breath, or chest pain.   You become dizzy or have trouble concentrating.  You develop heavy vaginal bleeding.   You develop a rash.   You have bloody or black, tarry stools.   You faint.   You vomit up blood.   You vomit repeatedly.   You have abdominal pain.  You have a fever or persistent symptoms for more than 2-3 days.   You have a fever and your symptoms suddenly get worse.   You are dehydrated.  MAKE SURE YOU:  Understand these instructions.  Will watch your condition.  Will get help right away if you are not doing well or get worse. Document Released: 05/23/2004 Document Revised: 12/16/2012 Document Reviewed: 10/09/2012 ExitCare Patient Information 2015 [employer]. This information is not intended to replace advice given to you by your health care provider. Make sure you discuss any questions you have with your health care provider.  

## 2014-08-20 ENCOUNTER — Encounter: Payer: Self-pay | Admitting: Hematology

## 2014-08-22 ENCOUNTER — Telehealth: Payer: Self-pay | Admitting: Hematology

## 2014-08-22 NOTE — Telephone Encounter (Signed)
Left message to confirm appointment for April & July. Mailed calendar.

## 2014-08-22 NOTE — Addendum Note (Signed)
Addended by: Truitt Merle on: 08/22/2014 05:25 PM   Modules accepted: Orders

## 2014-08-23 ENCOUNTER — Ambulatory Visit: Payer: Medicare PPO | Admitting: Hematology

## 2014-08-26 ENCOUNTER — Ambulatory Visit (HOSPITAL_BASED_OUTPATIENT_CLINIC_OR_DEPARTMENT_OTHER): Payer: Medicare HMO

## 2014-08-26 VITALS — BP 110/51 | HR 73 | Temp 98.9°F | Resp 18

## 2014-08-26 DIAGNOSIS — D509 Iron deficiency anemia, unspecified: Secondary | ICD-10-CM

## 2014-08-26 MED ORDER — FERUMOXYTOL INJECTION 510 MG/17 ML
510.0000 mg | Freq: Once | INTRAVENOUS | Status: AC
  Administered 2014-08-26: 510 mg via INTRAVENOUS
  Filled 2014-08-26: qty 17

## 2014-08-26 MED ORDER — SODIUM CHLORIDE 0.9 % IV SOLN
Freq: Once | INTRAVENOUS | Status: AC
  Administered 2014-08-26: 15:00:00 via INTRAVENOUS

## 2014-08-26 NOTE — Patient Instructions (Signed)

## 2014-09-02 ENCOUNTER — Other Ambulatory Visit: Payer: Medicare HMO

## 2014-09-09 ENCOUNTER — Ambulatory Visit: Payer: Medicare HMO | Admitting: Hematology

## 2014-09-10 ENCOUNTER — Encounter (HOSPITAL_COMMUNITY): Payer: Self-pay | Admitting: *Deleted

## 2014-09-10 ENCOUNTER — Emergency Department (HOSPITAL_COMMUNITY): Payer: Medicare HMO

## 2014-09-10 ENCOUNTER — Emergency Department (HOSPITAL_COMMUNITY)
Admission: EM | Admit: 2014-09-10 | Discharge: 2014-09-10 | Disposition: A | Discharge status: Home / Self Care | Payer: Medicare HMO | Attending: Emergency Medicine | Admitting: Emergency Medicine

## 2014-09-10 DIAGNOSIS — Z8659 Personal history of other mental and behavioral disorders: Secondary | ICD-10-CM | POA: Insufficient documentation

## 2014-09-10 DIAGNOSIS — D649 Anemia, unspecified: Secondary | ICD-10-CM | POA: Insufficient documentation

## 2014-09-10 DIAGNOSIS — Z79899 Other long term (current) drug therapy: Secondary | ICD-10-CM | POA: Diagnosis not present

## 2014-09-10 DIAGNOSIS — H540 Blindness, both eyes: Secondary | ICD-10-CM | POA: Insufficient documentation

## 2014-09-10 DIAGNOSIS — E162 Hypoglycemia, unspecified: Secondary | ICD-10-CM | POA: Diagnosis not present

## 2014-09-10 DIAGNOSIS — Z8719 Personal history of other diseases of the digestive system: Secondary | ICD-10-CM | POA: Diagnosis not present

## 2014-09-10 DIAGNOSIS — Z8742 Personal history of other diseases of the female genital tract: Secondary | ICD-10-CM | POA: Diagnosis not present

## 2014-09-10 DIAGNOSIS — Z3202 Encounter for pregnancy test, result negative: Secondary | ICD-10-CM | POA: Insufficient documentation

## 2014-09-10 DIAGNOSIS — Z86018 Personal history of other benign neoplasm: Secondary | ICD-10-CM | POA: Diagnosis not present

## 2014-09-10 DIAGNOSIS — R42 Dizziness and giddiness: Secondary | ICD-10-CM | POA: Diagnosis present

## 2014-09-10 DIAGNOSIS — Z87891 Personal history of nicotine dependence: Secondary | ICD-10-CM | POA: Insufficient documentation

## 2014-09-10 LAB — CBC
HCT: 37.1 % (ref 36.0–46.0)
Hemoglobin: 11.7 g/dL — ABNORMAL LOW (ref 12.0–15.0)
MCH: 32.7 pg (ref 26.0–34.0)
MCHC: 31.5 g/dL (ref 30.0–36.0)
MCV: 103.6 fL — ABNORMAL HIGH (ref 78.0–100.0)
Platelets: 194 10*3/uL (ref 150–400)
RBC: 3.58 MIL/uL — ABNORMAL LOW (ref 3.87–5.11)
RDW: 12.6 % (ref 11.5–15.5)
WBC: 11.4 10*3/uL — ABNORMAL HIGH (ref 4.0–10.5)

## 2014-09-10 LAB — URINALYSIS W MICROSCOPIC (NOT AT ARMC)
Bilirubin Urine: NEGATIVE
Glucose, UA: NEGATIVE mg/dL
Hgb urine dipstick: NEGATIVE
Ketones, ur: NEGATIVE mg/dL
Leukocytes, UA: NEGATIVE
Nitrite: NEGATIVE
Protein, ur: NEGATIVE mg/dL
Specific Gravity, Urine: 1.004 — ABNORMAL LOW (ref 1.005–1.030)
Urobilinogen, UA: 0.2 mg/dL (ref 0.0–1.0)
pH: 6 (ref 5.0–8.0)

## 2014-09-10 LAB — COMPREHENSIVE METABOLIC PANEL
ALT: 40 U/L (ref 14–54)
AST: 35 U/L (ref 15–41)
Albumin: 4.4 g/dL (ref 3.5–5.0)
Alkaline Phosphatase: 50 U/L (ref 38–126)
Anion gap: 8 (ref 5–15)
BUN: 14 mg/dL (ref 6–20)
CO2: 24 mmol/L (ref 22–32)
Calcium: 9.2 mg/dL (ref 8.9–10.3)
Chloride: 107 mmol/L (ref 101–111)
Creatinine, Ser: 0.63 mg/dL (ref 0.44–1.00)
GFR calc Af Amer: >60 mL/min (ref >60)
GFR calc non Af Amer: >60 mL/min (ref >60)
Glucose, Bld: 88 mg/dL (ref 65–99)
Potassium: 3.8 mmol/L (ref 3.5–5.1)
Sodium: 139 mmol/L (ref 135–145)
Total Bilirubin: 0.8 mg/dL (ref 0.3–1.2)
Total Protein: 7.8 g/dL (ref 6.5–8.1)

## 2014-09-10 LAB — CBG MONITORING, ED
Glucose-Capillary: 55 mg/dL — ABNORMAL LOW (ref 65–99)
Glucose-Capillary: 65 mg/dL (ref 65–99)

## 2014-09-10 LAB — I-STAT TROPONIN, ED: Troponin i, poc: 0 ng/mL (ref 0.00–0.08)

## 2014-09-10 LAB — PREGNANCY, URINE: Preg Test, Ur: NEGATIVE

## 2014-09-10 MED ORDER — SODIUM CHLORIDE 0.9 % IV SOLN
INTRAVENOUS | Status: DC
  Administered 2014-09-10: 12:00:00 via INTRAVENOUS

## 2014-09-10 NOTE — ED Provider Notes (Signed)
CSN: [id number]     Arrival date & time 09/10/14  1028 History   First MD Initiated Contact with Patient 09/10/14 1120     Chief Complaint  Patient presents with  . Dizziness  . Heartburn   HPI Pt started having symptoms where she was having trouble with acid reflux.  SHe did drink some coffee today.  She has heart fluttering and feels like she gets dizzy.  Today she started having trouble with chest tightness.  She thought it might be her acid reflux because she was admitted to the hospital and the past and was told that her heart was normal and that her symptoms could be acid reflux.  Pt also felt that her blood sugar might be low because she felt shakey.  No leg swelling.  No DVT or PE. Past Medical History  Diagnosis Date  . Breast lump left  . Blind 1994  . Blood transfusion 04/2010    cancer center at [facility] long - iron transfusion per pt  . Pseudotumor cerebri 1994    History - caused blind  . 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 Anemia     history  . H/O hiatal hernia     repaired with weight loss surgery  . Depression     no meds  . History of uterine fibroid    Past Surgical History  Procedure Laterality Date  . Eye decompression x3  1994    Bilateral   . Lp shunt  1994    removed in 2009  . Rurod switch  2007    weight loss surgery done in [place] (duodenal switch)  . Fetal blood transfusion  jan 29,2012  . Breast surgery  2012    right breast lumpectomy  . Appendectomy      removed with switch surgery  . Cholecystectomy      removed with switch surgery  . Myomectomy     Family History  Problem Relation Age of Onset  . Cancer Maternal Aunt     breast-passed in 2012  . COPD Mother   . Depression Mother   . Hypertension Mother   . Hyperlipidemia Mother   . Cancer Mother   . Osteoporosis Mother   . Heart attack Father   . Stroke Paternal Grandmother   . Stroke Paternal Grandfather    History  Substance Use  Topics  . Smoking status: Former Smoker -- 0.25 packs/day for 3 years    Types: Cigarettes    Start date: 03/09/2014  . Smokeless tobacco: Never Used  . Alcohol Use: 0.0 oz/week    0 Standard drinks or equivalent per week     Comment: occasionally   OB History    Gravida Para Term Preterm AB TAB SAB Ectopic Multiple Living   0 0 0 0 0 0 0 0 0 0      Review of Systems  All other systems reviewed and are negative.     Allergies  Influenza vaccines  Home Medications   Prior to Admission medications   Medication Sig Start Date End Date Taking? Authorizing Provider  Calcium Carbonate (CALTRATE 600 PO) Take 2 tablets by mouth 2 (two) times daily.   Yes Historical Provider, MD  Cholecalciferol (VITAMIN D3 PO) Take 50,000 Units by mouth 2 (two) times daily.   Yes Historical Provider, MD  Cyanocobalamin (VITAMIN B-12 PO) Take 1 tablet by mouth daily.   Yes Historical Provider, MD  norethindrone-ethinyl estradiol (  MICROGESTIN) 1-20 MG-MCG tablet Take 1 tablet by mouth. Days 1-20 07/26/12  Yes Historical Provider, MD  omeprazole (PRILOSEC) 40 MG capsule Take 1 capsule (40 mg total) by mouth daily. 04/15/14  Yes Kennyth Arnold, FNP  prenatal vitamin w/FE, FA (PRENATAL 1 + 1) 27-1 MG TABS tablet Take 1 tablet by mouth daily at 12 noon.   Yes Historical Provider, MD  [place]   Yes Historical Provider, MD  Probiotic Product (PROBIOTIC PO) Take 1 capsule by mouth daily.   Yes Historical Provider, MD  clonazePAM (KLONOPIN) 0.5 MG tablet Take 1 tablet (0.5 mg total) by mouth daily as needed for anxiety. Patient not taking: Reported on 08/19/2014 04/15/14   Kennyth Arnold, FNP  ibuprofen (ADVIL,MOTRIN) 800 MG tablet Take 1 tablet (800 mg total) by mouth every 8 (eight) hours as needed for cramping. Patient not taking: Reported on 09/10/2014 07/01/13   Shelly Bombard, MD  potassium chloride SA (K-DUR,KLOR-CON) 20 MEQ tablet Take 1 tablet (20 mEq total) by mouth  daily. Patient not taking: Reported on 09/10/2014 05/06/14   Truitt Merle, MD   BP 114/75 mmHg  Pulse 77  Temp(Src) 98.4 F (36.9 C) (Oral)  Resp 18  SpO2 100%  LMP 07/28/2014 Physical Exam  Constitutional: She appears well-developed and well-nourished. No distress.  HENT:  Head: Normocephalic and atraumatic.  Right Ear: External ear normal.  Left Ear: External ear normal.  Eyes: Conjunctivae are normal. Right eye exhibits no discharge. Left eye exhibits no discharge. No scleral icterus.  Neck: Neck supple. No tracheal deviation present.  Cardiovascular: Normal rate, regular rhythm and intact distal pulses.   Pulmonary/Chest: Effort normal and breath sounds normal. No stridor. No respiratory distress. She has no wheezes. She has no rales.  Abdominal: Soft. Bowel sounds are normal. She exhibits no distension. There is no tenderness. There is no rebound and no guarding.  Musculoskeletal: She exhibits no edema or tenderness.  Neurological: She is alert. She has normal strength. No cranial nerve deficit (no facial droop, extraocular movements intact, no slurred speech) or sensory deficit. She exhibits normal muscle tone. She displays no seizure activity. Coordination normal.  Skin: Skin is warm and dry. No rash noted.  Psychiatric: She has a normal mood and affect.  Nursing note and vitals reviewed.   ED Course  Procedures (including critical care time) Labs Review Labs Reviewed  CBC - Abnormal; Notable for the following:    WBC 11.4 (*)    RBC 3.58 (*)    Hemoglobin 11.7 (*)    MCV 103.6 (*)    All other components within normal limits  URINALYSIS W MICROSCOPIC - Abnormal; Notable for the following:    Specific Gravity, Urine 1.004 (*)    All other components within normal limits  CBG MONITORING, ED - Abnormal; Notable for the following:    Glucose-Capillary 55 (*)    All other components within normal limits  PREGNANCY, URINE  COMPREHENSIVE METABOLIC PANEL  I-STAT TROPOININ, ED   CBG MONITORING, ED    Imaging Review Dg Chest 1 View  09/10/2014   CLINICAL DATA:  Dizziness and chest tightness today. Hypertension. History of breast cancer. Former smoker.  EXAM: CHEST  1 VIEW  COMPARISON:  03/22/2014; 01/09/2005  FINDINGS: Normal cardiac silhouette and mediastinal contours. No focal parenchymal opacities. No pleural effusion or pneumothorax. No evidence of edema. Multiple surgical clips overlie the left upper abdominal quadrant. No acute osseus abnormalities.  IMPRESSION: No acute cardiopulmonary disease.  Electronically Signed   By: Sandi Mariscal M.D.   On: 09/10/2014 12:16     EKG Interpretation   Date/Time:  Saturday Sep 10 2014 11:10:33 EDT Ventricular Rate:  73 PR Interval:  162 QRS Duration: 81 QT Interval:  433 QTC Calculation: 477 R Axis:   57 Text Interpretation:  Sinus rhythm Borderline T wave abnormalities No  significant change since last tracing Confirmed by Kolten Ryback  MD-J, Christol Thetford  ([id number]) on 09/10/2014 11:31:14 AM      MDM   Final diagnoses:  Hypoglycemia    The patient presents to the emergency room with several symptoms. I think some of these may have been related to an episode of hypoglycemia and some anxiety.  Labs and exam otherwise reassuring.  She was monitored in the ED.  Sx resolved after she had something to eat.    Pt is not on oral hypoglycemic agents.  Asked her to follow up with her PCP.  Continue to ear regular meals.    Dorie Rank, MD 09/11/14 1540

## 2014-09-10 NOTE — ED Notes (Signed)
Nurse starting IV 

## 2014-09-10 NOTE — Discharge Instructions (Signed)
Low Blood Sugar °Low blood sugar (hypoglycemia) means that the level of sugar in your blood is lower than it should be. Signs of low blood sugar include: °· Getting sweaty. °· Feeling hungry. °· Feeling dizzy or weak. °· Feeling sleepier than normal. °· Feeling nervous. °· Headaches. °· Having a fast heartbeat. °Low blood sugar can happen fast and can be an emergency. Your doctor can do tests to check your blood sugar level. You can have low blood sugar and not have diabetes. °HOME CARE °· Check your blood sugar as told by your doctor. If it is less than 70 mg/dl or as told by your doctor, take 1 of the following: °¨ 3 to 4 glucose tablets. °¨ ½ cup clear juice. °¨ ½ cup soda pop, not diet. °¨ 1 cup milk. °¨ 5 to 6 hard candies. °· Recheck blood sugar after 15 minutes. Repeat until it is at the right level. °· Eat a snack if it is more than 1 hour until the next meal. °· Only take medicine as told by your doctor. °· Do not skip meals. Eat on time. °· Do not drink alcohol except with meals. °· Check your blood glucose before driving. °· Check your blood glucose before and after exercise. °· Always carry treatment with you, such as glucose pills. °· Always wear a medical alert bracelet if you have diabetes. °GET HELP RIGHT AWAY IF:  °· Your blood glucose goes below 70 mg/dl or as told by your doctor, and you: °¨ Are confused. °¨ Are not able to swallow. °¨ Pass out (faint). °· You cannot treat yourself. You may need someone to help you. °· You have low blood sugar problems often. °· You have problems from your medicines. °· You are not feeling better after 3 to 4 days. °· You have vision changes. °MAKE SURE YOU:  °· Understand these instructions. °· Will watch this condition. °· Will get help right away if you are not doing well or get worse. °Document Released: 07/10/2009 Document Revised: 07/08/2011 Document Reviewed: 07/10/2009 °ExitCare® Patient Information ©2015 [employer]. This information is not intended to  replace advice given to you by your health care provider. Make sure you discuss any questions you have with your health care provider. ° °

## 2014-09-10 NOTE — ED Notes (Signed)
Bed: VP71 Expected date:  Expected time:  Means of arrival:  Comments: EMS-HM

## 2014-09-10 NOTE — ED Notes (Signed)
Patient has history of vitamin deficiency following bariatric surgery and reports a sudden onset of dizziness and chest tightness. Patient states she has been diagnosed with anxiety and gerd but only takes medication for the reflux. Patient states she feels like she's going to pass out.

## 2014-09-14 ENCOUNTER — Ambulatory Visit (INDEPENDENT_AMBULATORY_CARE_PROVIDER_SITE_OTHER): Payer: Medicare HMO | Admitting: Family Medicine

## 2014-09-14 ENCOUNTER — Encounter: Payer: Self-pay | Admitting: Family Medicine

## 2014-09-14 VITALS — BP 120/90 | HR 84 | Temp 99.1°F | Wt 199.0 lb

## 2014-09-14 DIAGNOSIS — R5383 Other fatigue: Secondary | ICD-10-CM | POA: Diagnosis not present

## 2014-09-14 DIAGNOSIS — R1013 Epigastric pain: Secondary | ICD-10-CM

## 2014-09-14 DIAGNOSIS — R531 Weakness: Secondary | ICD-10-CM

## 2014-09-14 DIAGNOSIS — Z9884 Bariatric surgery status: Secondary | ICD-10-CM

## 2014-09-14 DIAGNOSIS — D509 Iron deficiency anemia, unspecified: Secondary | ICD-10-CM

## 2014-09-14 DIAGNOSIS — R42 Dizziness and giddiness: Secondary | ICD-10-CM

## 2014-09-14 LAB — CBC WITH DIFFERENTIAL/PLATELET
Basophils Absolute: 0 10*3/uL (ref 0.0–0.1)
Basophils Relative: 0.1 % (ref 0.0–3.0)
Eosinophils Absolute: 0 10*3/uL (ref 0.0–0.7)
Eosinophils Relative: 0.3 % (ref 0.0–5.0)
HCT: 33.8 % — ABNORMAL LOW (ref 36.0–46.0)
Hemoglobin: 11.1 g/dL — ABNORMAL LOW (ref 12.0–15.0)
Lymphocytes Relative: 16 % (ref 12.0–46.0)
Lymphs Abs: 1.7 10*3/uL (ref 0.7–4.0)
MCHC: 33 g/dL (ref 30.0–36.0)
MCV: 99.8 fl (ref 78.0–100.0)
Monocytes Absolute: 0.5 10*3/uL (ref 0.1–1.0)
Monocytes Relative: 4.8 % (ref 3.0–12.0)
Neutro Abs: 8.3 10*3/uL — ABNORMAL HIGH (ref 1.4–7.7)
Neutrophils Relative %: 78.8 % — ABNORMAL HIGH (ref 43.0–77.0)
Platelets: 188 10*3/uL (ref 150.0–400.0)
RBC: 3.38 Mil/uL — ABNORMAL LOW (ref 3.87–5.11)
RDW: 13.8 % (ref 11.5–15.5)
WBC: 10.5 10*3/uL (ref 4.0–10.5)

## 2014-09-14 LAB — VITAMIN B12: Vitamin B-12: 847 pg/mL (ref 211–911)

## 2014-09-14 LAB — COMPREHENSIVE METABOLIC PANEL
ALT: 30 U/L (ref 0–35)
AST: 25 U/L (ref 0–37)
Albumin: 4 g/dL (ref 3.5–5.2)
Alkaline Phosphatase: 53 U/L (ref 39–117)
BUN: 10 mg/dL (ref 6–23)
CO2: 30 mEq/L (ref 19–32)
Calcium: 8.8 mg/dL (ref 8.4–10.5)
Chloride: 104 mEq/L (ref 96–112)
Creatinine, Ser: 0.59 mg/dL (ref 0.40–1.20)
GFR: 146.47 mL/min (ref >60.00)
Glucose, Bld: 74 mg/dL (ref 70–99)
Potassium: 3.7 mEq/L (ref 3.5–5.1)
Sodium: 137 mEq/L (ref 135–145)
Total Bilirubin: 0.5 mg/dL (ref 0.2–1.2)
Total Protein: 7.1 g/dL (ref 6.0–8.3)

## 2014-09-14 LAB — GLUCOSE, POCT (MANUAL RESULT ENTRY): POC Glucose: 77 mg/dl (ref 70–99)

## 2014-09-14 LAB — POCT URINALYSIS DIPSTICK
Blood, UA: NEGATIVE
Glucose, UA: NEGATIVE
Leukocytes, UA: NEGATIVE
Nitrite, UA: NEGATIVE
Protein, UA: NEGATIVE
Spec Grav, UA: 1.025
Urobilinogen, UA: 0.2
pH, UA: 5.5

## 2014-09-14 LAB — VITAMIN D 25 HYDROXY (VIT D DEFICIENCY, FRACTURES): VITD: 6.58 ng/mL — ABNORMAL LOW (ref 30.00–100.00)

## 2014-09-14 LAB — PROTIME-INR
INR: 1.1 ratio — ABNORMAL HIGH (ref 0.8–1.0)
Prothrombin Time: 12 s (ref 9.6–13.1)

## 2014-09-14 LAB — IRON: Iron: 87 ug/dL (ref 42–145)

## 2014-09-14 LAB — TSH: TSH: 2.12 u[IU]/mL (ref 0.35–4.50)

## 2014-09-14 LAB — T4, FREE: Free T4: 0.83 ng/dL (ref 0.60–1.60)

## 2014-09-14 LAB — H. PYLORI ANTIBODY, IGG: H Pylori IgG: POSITIVE — AB

## 2014-09-14 NOTE — Progress Notes (Signed)
Pre visit review using our clinic review tool, if applicable. No additional management support is needed unless otherwise documented below in the visit note. 

## 2014-09-14 NOTE — Progress Notes (Signed)
Subjective:    Patient ID: Mackenzie [name], female    DOB: 03-Apr-1976, 39 y.o.   MRN: [id number]  HPI Patient seen for ER follow-up.  She has history of obesity, pseudotumor cerebri, blindness, history of Guillain Barre' syndrome, history of gastric bypass surgery, chronic iron deficiency and receiving periodic infusions. She was seen recently on May 14 in ER. She has history of questionable acid reflux though this has never been formally diagnosed. She apparently had some nonspecific symptoms of heart fluttering and feeling dizzy. On the day that she was seen in ER she had complaints of chest tightness. Patient feels her blood sugar may be dropping though she takes no hyperglycemic medications. Labs reviewed. Hemoglobin stable 11.7. Electrolytes were unremarkable. Chest no acute cardiopulmonary disease. EKG unremarkable.  Patient is concerned that she has had multiple vitamin deficiencies following her gastric bypass back in 2007. She is actually scheduled to see her surgeon from gastric bypass in [place] next month. He requested multiple labs and patient brings in a copy of these today requested these be drawn here. Denies any recent melena. No hematemesis. No appetite or weight changes. She has some nonspecific lightheadedness intermittently. No loss of consciousness. Denies any vertigo symptoms.  Past Medical History  Diagnosis Date  . Breast lump left  . Blind 1994  . Blood transfusion 04/2010    cancer center at [facility] - iron transfusion per pt  . Pseudotumor cerebri 1994    History - caused blind  . 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 Anemia     history  . H/O hiatal hernia     repaired with weight loss surgery  . Depression     no meds  . History of uterine fibroid    Past Surgical History  Procedure Laterality Date  . Eye decompression x3  1994    Bilateral   . Lp shunt  1994    removed in 2009  . Rurod switch  2007   weight loss surgery done in [place] (duodenal switch)  . Fetal blood transfusion  jan 29,2012  . Breast surgery  2012    right breast lumpectomy  . Appendectomy      removed with switch surgery  . Cholecystectomy      removed with switch surgery  . Myomectomy      reports that she has quit smoking. Her smoking use included Cigarettes. She started smoking about 6 months ago. She has a .75 pack-year smoking history. She has never used smokeless tobacco. She reports that she drinks alcohol. She reports that she does not use illicit drugs. family history includes COPD in her mother; Cancer in her maternal aunt and mother; Depression in her mother; Heart attack in her father; Hyperlipidemia in her mother; Hypertension in her mother; Osteoporosis in her mother; Stroke in her paternal grandfather and paternal grandmother. Allergies  Allergen Reactions  . Influenza Vaccines     Hx Guillain Barre Syndrome      Review of Systems  Constitutional: Positive for fatigue. Negative for fever and chills.  Respiratory: Negative for cough and shortness of breath.   Cardiovascular: Positive for palpitations. Negative for leg swelling.  Gastrointestinal: Negative for nausea, vomiting, abdominal pain, diarrhea and constipation.  Genitourinary: Negative for dysuria.  Neurological: Positive for dizziness and light-headedness. Negative for seizures and syncope.  Hematological: Negative for adenopathy.  Psychiatric/Behavioral: Negative for confusion.       Objective:   Physical  Exam  Constitutional: She is oriented to person, place, and time. She appears well-developed and well-nourished.  Neck: Neck supple. No thyromegaly present.  Cardiovascular: Normal rate and regular rhythm.  Exam reveals no gallop and no friction rub.   No murmur heard. Pulmonary/Chest: Effort normal and breath sounds normal. No respiratory distress. She has no wheezes. She has no rales.  Musculoskeletal: She exhibits no edema.   Neurological: She is alert and oriented to person, place, and time. No cranial nerve deficit.  Psychiatric: She has a normal mood and affect.          Assessment & Plan:  Patient has history of gastric bypass and chronic low-grade anemia and chronic iron deficiency. She is seen today with recent nonspecific symptoms as above. Her previous surgeon is requesting multiple labs for follow-up regarding her gastric bypass and these are ordered. She does not have any documented worrisome hypoglycemia. Blood sugar today 77.

## 2014-09-15 LAB — PTH, INTACT AND CALCIUM
Calcium: 8.8 mg/dL (ref 8.4–10.5)
PTH: 82 pg/mL — ABNORMAL HIGH (ref 14–64)

## 2014-09-15 LAB — T3 UPTAKE: T3 Uptake: 32 % (ref 22–35)

## 2014-09-16 ENCOUNTER — Telehealth: Payer: Self-pay | Admitting: Family

## 2014-09-16 MED ORDER — AMOXICILLIN 500 MG PO CAPS: 1000.0000 mg | ORAL_CAPSULE | Freq: Two times a day (BID) | ORAL | Status: DC

## 2014-09-16 MED ORDER — CLARITHROMYCIN 500 MG PO TABS: 500.0000 mg | ORAL_TABLET | Freq: Two times a day (BID) | ORAL | Status: DC

## 2014-09-16 NOTE — Telephone Encounter (Signed)
Pt seen by dr Elease Hashimoto 5/18. Was supposed to have abx called in but I do not see that happedned,  Make sure she is taking the Vit D regularly and clarify dose. For H Pylori (and if no allergies) I recommend: #1) Continue with her daily Prilosec #2) Amoxicillin 1 gram bid for 10 days #3) Clarithromycin 500 mg po bid for 10 days  Can you send to [facility]/ [address]? Thanks  Also pt wanted to make sure results are faxed to her physician in [place]

## 2014-09-16 NOTE — Telephone Encounter (Signed)
Called and spoke with pt and pt is aware of lab results. Per pt she is taking Citracal daily but states she is going to get an additional vitamin D supplement.  Advised pt to take Vitamin D3 [phone] IU daily. Pt is aware that the 2 antibiotics will be sent to the pharmacy.  Also advised pt to speak with the pharmacist about the best time to take both antibiotics.  Pt verbalized understanding. Rx sent to pharmacy.

## 2014-09-18 LAB — VITAMIN A: Vitamin A (Retinoic Acid): 26 ug/dL — ABNORMAL LOW (ref 38–98)

## 2014-09-19 LAB — VITAMIN B6: Vitamin B6: 37 ng/mL — ABNORMAL HIGH (ref 2.1–21.7)

## 2014-09-19 NOTE — Telephone Encounter (Signed)
Per [facility] labs faxed to physician in [place]

## 2014-09-22 ENCOUNTER — Ambulatory Visit: Payer: Medicare HMO | Admitting: Family Medicine

## 2014-10-07 NOTE — Telephone Encounter (Signed)
Done. Confirmation received and patient is aware.

## 2014-10-07 NOTE — Telephone Encounter (Signed)
Ok to re-send?  °

## 2014-10-07 NOTE — Telephone Encounter (Signed)
Patient called back stating the office in [place] told her they haven't received the lab results.  Can I re-send the lab results from 09/14/14 to (f) [phone], Attn: Ara?

## 2014-10-10 DIAGNOSIS — K219 Gastro-esophageal reflux disease without esophagitis: Secondary | ICD-10-CM | POA: Diagnosis not present

## 2014-10-10 DIAGNOSIS — E509 Vitamin A deficiency, unspecified: Secondary | ICD-10-CM | POA: Diagnosis not present

## 2014-10-10 DIAGNOSIS — E569 Vitamin deficiency, unspecified: Secondary | ICD-10-CM | POA: Diagnosis not present

## 2014-10-10 DIAGNOSIS — E65 Localized adiposity: Secondary | ICD-10-CM | POA: Diagnosis not present

## 2014-10-10 DIAGNOSIS — D508 Other iron deficiency anemias: Secondary | ICD-10-CM | POA: Diagnosis not present

## 2014-10-10 DIAGNOSIS — Z9884 Bariatric surgery status: Secondary | ICD-10-CM | POA: Diagnosis not present

## 2014-10-10 DIAGNOSIS — E212 Other hyperparathyroidism: Secondary | ICD-10-CM | POA: Diagnosis not present

## 2014-10-18 DIAGNOSIS — F410 Panic disorder [episodic paroxysmal anxiety] without agoraphobia: Secondary | ICD-10-CM | POA: Diagnosis not present

## 2014-10-18 DIAGNOSIS — Z9884 Bariatric surgery status: Secondary | ICD-10-CM | POA: Diagnosis not present

## 2014-10-18 DIAGNOSIS — R142 Eructation: Secondary | ICD-10-CM | POA: Diagnosis not present

## 2014-10-18 DIAGNOSIS — D509 Iron deficiency anemia, unspecified: Secondary | ICD-10-CM | POA: Diagnosis not present

## 2014-10-18 DIAGNOSIS — E559 Vitamin D deficiency, unspecified: Secondary | ICD-10-CM | POA: Diagnosis not present

## 2014-10-18 DIAGNOSIS — E509 Vitamin A deficiency, unspecified: Secondary | ICD-10-CM | POA: Diagnosis not present

## 2014-11-10 DIAGNOSIS — Z86018 Personal history of other benign neoplasm: Secondary | ICD-10-CM | POA: Diagnosis not present

## 2014-11-10 DIAGNOSIS — N3946 Mixed incontinence: Secondary | ICD-10-CM | POA: Diagnosis not present

## 2014-11-10 DIAGNOSIS — N920 Excessive and frequent menstruation with regular cycle: Secondary | ICD-10-CM | POA: Diagnosis not present

## 2014-11-10 DIAGNOSIS — N760 Acute vaginitis: Secondary | ICD-10-CM | POA: Diagnosis not present

## 2014-11-18 ENCOUNTER — Other Ambulatory Visit (HOSPITAL_BASED_OUTPATIENT_CLINIC_OR_DEPARTMENT_OTHER): Payer: Commercial Managed Care - HMO

## 2014-11-18 DIAGNOSIS — D538 Other specified nutritional anemias: Secondary | ICD-10-CM | POA: Diagnosis not present

## 2014-11-18 DIAGNOSIS — D509 Iron deficiency anemia, unspecified: Secondary | ICD-10-CM | POA: Diagnosis not present

## 2014-11-18 DIAGNOSIS — D532 Scorbutic anemia: Secondary | ICD-10-CM | POA: Diagnosis not present

## 2014-11-20 LAB — ZINC: Zinc: 52 ug/dL — ABNORMAL LOW (ref 60–130)

## 2014-11-20 LAB — COPPER, SERUM: Copper: 65 ug/dL — ABNORMAL LOW (ref 70–175)

## 2014-11-22 DIAGNOSIS — H4720 Unspecified optic atrophy: Secondary | ICD-10-CM | POA: Diagnosis not present

## 2014-11-25 ENCOUNTER — Ambulatory Visit (HOSPITAL_BASED_OUTPATIENT_CLINIC_OR_DEPARTMENT_OTHER): Payer: Commercial Managed Care - HMO | Admitting: Hematology

## 2014-11-25 ENCOUNTER — Ambulatory Visit (HOSPITAL_BASED_OUTPATIENT_CLINIC_OR_DEPARTMENT_OTHER): Payer: Commercial Managed Care - HMO

## 2014-11-25 ENCOUNTER — Telehealth: Payer: Self-pay | Admitting: Hematology

## 2014-11-25 ENCOUNTER — Encounter: Payer: Self-pay | Admitting: Hematology

## 2014-11-25 ENCOUNTER — Telehealth: Payer: Self-pay | Admitting: *Deleted

## 2014-11-25 VITALS — BP 124/69 | HR 81 | Temp 98.6°F | Resp 18 | Ht 66.0 in | Wt 202.1 lb

## 2014-11-25 DIAGNOSIS — D509 Iron deficiency anemia, unspecified: Secondary | ICD-10-CM | POA: Diagnosis not present

## 2014-11-25 DIAGNOSIS — E568 Deficiency of other vitamins: Secondary | ICD-10-CM | POA: Diagnosis not present

## 2014-11-25 DIAGNOSIS — E509 Vitamin A deficiency, unspecified: Secondary | ICD-10-CM

## 2014-11-25 DIAGNOSIS — E559 Vitamin D deficiency, unspecified: Secondary | ICD-10-CM

## 2014-11-25 LAB — CBC & DIFF AND RETIC
BASO%: 0.3 % (ref 0.0–2.0)
Basophils Absolute: 0 10*3/uL (ref 0.0–0.1)
EOS%: 0.9 % (ref 0.0–7.0)
Eosinophils Absolute: 0.1 10*3/uL (ref 0.0–0.5)
HCT: 34.5 % — ABNORMAL LOW (ref 34.8–46.6)
HGB: 11.2 g/dL — ABNORMAL LOW (ref 11.6–15.9)
Immature Retic Fract: 3.7 % (ref 1.60–10.00)
LYMPH%: 21.9 % (ref 14.0–49.7)
MCH: 33.1 pg (ref 25.1–34.0)
MCHC: 32.5 g/dL (ref 31.5–36.0)
MCV: 102.1 fL — ABNORMAL HIGH (ref 79.5–101.0)
MONO#: 0.7 10*3/uL (ref 0.1–0.9)
MONO%: 7.3 % (ref 0.0–14.0)
NEUT#: 6.3 10*3/uL (ref 1.5–6.5)
NEUT%: 69.6 % (ref 38.4–76.8)
Platelets: 219 10*3/uL (ref 145–400)
RBC: 3.38 10*6/uL — ABNORMAL LOW (ref 3.70–5.45)
RDW: 12.9 % (ref 11.2–14.5)
Retic %: 4.24 % — ABNORMAL HIGH (ref 0.70–2.10)
Retic Ct Abs: 143.31 10*3/uL — ABNORMAL HIGH (ref 33.70–90.70)
WBC: 9.1 10*3/uL (ref 3.9–10.3)
lymph#: 2 10*3/uL (ref 0.9–3.3)

## 2014-11-25 LAB — COMPREHENSIVE METABOLIC PANEL (CC13)
ALT: 25 U/L (ref 0–55)
AST: 26 U/L (ref 5–34)
Albumin: 4.1 g/dL (ref 3.5–5.0)
Alkaline Phosphatase: 50 U/L (ref 40–150)
Anion Gap: 6 mEq/L (ref 3–11)
BUN: 16.6 mg/dL (ref 7.0–26.0)
CO2: 25 mEq/L (ref 22–29)
Calcium: 8.8 mg/dL (ref 8.4–10.4)
Chloride: 107 mEq/L (ref 98–109)
Creatinine: 0.9 mg/dL (ref 0.6–1.1)
EGFR: >90 mL/min/{1.73_m2} (ref >90)
Glucose: 78 mg/dl (ref 70–140)
Potassium: 4.3 mEq/L (ref 3.5–5.1)
Sodium: 138 mEq/L (ref 136–145)
Total Bilirubin: 0.54 mg/dL (ref 0.20–1.20)
Total Protein: 7.6 g/dL (ref 6.4–8.3)

## 2014-11-25 NOTE — Progress Notes (Signed)
[place] OFFICE PROGRESS NOTE  Kennyth Arnold, FNP [address] [place] [address]  DIAGNOSIS: Iron deficiency anemia - Plan: Comprehensive metabolic panel (Cmet) - [facility], CBC & Diff and Retic, Vitamin D 25 hydroxy, Vitamin A, Ferritin, Iron and TIBC [facility]  Vitamin A deficiency  Vitamin D deficiency  Chief Complaint  Patient presents with  . Follow-up    CURRENT THERAPY:  Feraheme , every 3 months , as needed  INTERVAL HISTORY: Mackenzie [name] 39 y.o. female with a history of blindness, obesity s/p gastric bypass, who presents along with her mother for follow-up for her iron deficiency anemia. She was last seen by me 3 months ago.   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 She also had a hiistory of morbid obesity and had attempted to have bariatric surgery in [place] by Dr. Lorin Picket. Medical records indicated she had a duodenal switch procedure done in 2007. Her anemia, she reports, has worsened since her gastric bypass surgery.   She had episodes of dizziness and belching, and severe weakness and could not function well 2 months ago. She went back to see her bariatric surgeon Dr. Darla Lesches and was found to have low VitA and VitD levels, she was recommended to start vitD 100K U daily for 3 months, and vitA 50K daily, increased meat intake, etc, she has been slightly better, able to function better. She complains about epigastric discomfort, and is concerned about hernia, she is scheduled to see GI Dr. Watt Climes on 8/5. She has heavy menstrual period, she uses diaper some time, it lasts 7 days, heavy for 3 days. She is waiting for her IUD to be placed in Oct (she is not able to pay now) .     MEDICAL HISTORY: Past Medical History  Diagnosis  Date  . Breast lump left  . Blind 1994  . Blood transfusion 04/2010    cancer center at [facility] long - iron transfusion per pt  . Pseudotumor cerebri 1994    History - caused blind  . 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 Anemia     history  . H/O hiatal hernia     repaired with weight loss surgery  . Depression     no meds  . History of uterine fibroid     ALLERGIES:  is allergic to influenza vaccines.  MEDICATIONS: has a current medication list which includes the following prescription(s): calcium carbonate, cholecalciferol, clonazepam, cyanocobalamin, ibuprofen, norethindrone-ethinyl estradiol, omeprazole, potassium chloride sa, prenatal vitamin w/fe, fa, PRESCRIPTION MEDICATION, probiotic product, and vitamin a.  SURGICAL HISTORY:  Past Surgical History  Procedure Laterality Date  . Eye decompression x3  1994    Bilateral   . Lp shunt  1994    removed in 2009  . Rurod switch  2007    weight loss surgery done in [place] (duodenal switch)  . Fetal blood transfusion  jan 29,2012  . Breast surgery  2012    right breast lumpectomy  . Appendectomy      removed with switch surgery  . Cholecystectomy      removed with switch surgery  . Myomectomy      REVIEW OF SYSTEMS:   Constitutional: Denies fevers, chills or abnormal weight loss,  Mild fatigue Eyes:  Denies blurriness of vision Ears, nose, mouth, throat, and face: Denies mucositis or sore throat Respiratory: Denies cough, dyspnea or wheezes Cardiovascular: Denies palpitation, chest discomfort or lower extremity swelling Gastrointestinal:  Denies nausea, heartburn or change in bowel habits Skin: Denies abnormal skin rashes Lymphatics: Denies new lymphadenopathy or easy bruising Neurological:Denies numbness, tingling or new weaknesses Behavioral/Psych: Mood is stable, no new changes  All other systems were reviewed with the patient and are negative.  PHYSICAL EXAMINATION: ECOG  PERFORMANCE STATUS: 1  Blood pressure 124/69, pulse 81, temperature 98.6 F (37 C), temperature source Oral, resp. rate 18, height 5\' 6"  (1.676 m), weight 202 lb 1.6 oz (91.672 kg), SpO2 99 %.  GENERAL:alert, no distress and comfortable; blind, alopecia and obese SKIN: skin color, texture, turgor are normal, no rashes or significant lesions EYES: normal, Conjunctiva are pink and non-injected, sclera clear OROPHARYNX:no exudate, no erythema and lips, buccal mucosa, and tongue normal  NECK: supple, thyroid normal size, non-tender, without nodularity LYMPH:  no palpable lymphadenopathy in the cervical, axillary or supraclavicular LUNGS: clear to auscultation and percussion with normal breathing effort HEART: regular rate & rhythm and no murmurs and no lower extremity edema ABDOMEN:abdomen soft, non-tender and normal bowel sounds Musculoskeletal:no cyanosis of digits and no clubbing  NEURO: alert & oriented x 3 with fluent speech, no focal motor/sensory deficits   LABORATORY DATA: CBC Latest Ref Rng 11/25/2014 09/14/2014 09/10/2014  WBC 3.9 - 10.3 10e3/uL 9.1 10.5 11.4(H)  Hemoglobin 11.6 - 15.9 g/dL 11.2(L) 11.1(L) 11.7(L)  Hematocrit 34.8 - 46.6 % 34.5(L) 33.8(L) 37.1  Platelets 145 - 400 10e3/uL 219 188.0 194   MCV 102.4 today   CMP Latest Ref Rng 11/25/2014 09/14/2014 09/14/2014  Glucose 70 - 140 mg/dl 78 - 74  BUN 7.0 - 26.0 mg/dL 16.6 - 10  Creatinine 0.6 - 1.1 mg/dL 0.9 - 0.59  Sodium 136 - 145 mEq/L 138 - 137  Potassium 3.5 - 5.1 mEq/L 4.3 - 3.7  Chloride 96 - 112 mEq/L - - 104  CO2 22 - 29 mEq/L 25 - 30  Calcium 8.4 - 10.4 mg/dL 8.8 8.8 8.8  Total Protein 6.4 - 8.3 g/dL 7.6 - 7.1  Total Bilirubin 0.20 - 1.20 mg/dL 0.54 - 0.5  Alkaline Phos 40 - 150 U/L 50 - 53  AST 5 - 34 U/L 26 - 25  ALT 0 - 55 U/L 25 - 30     Anemia work up  Recent Labs  11/25/14 1535  RETICCTPCT 4.24*     RADIOGRAPHIC STUDIES: No new study   ASSESSMENT: Mackenzie [name] 39 y.o. female with  a history of iron deficient anemia and multiple nutritional deficiency due to her prior duodenal switch procedure in 2007.  PLAN:   1. Nutritional anemia. - she was diagnosed with iron deficient anemia secondary to her gastric bypass surgery, and heavy menstrual period. She responded well to IV iron. - she now has macrocytic anemia,  but the X73 and folic acid level are normal -her lab work also showed low Burt Knack and think level, which may also contribute to her anemia -Her hemoglobin is 11.2, did not correct completely by IV iron. She likely has as a component of anemia, possible other nutritional secondary to her gastric bypass surgery -if her iron and ferritin level is low, I will set up IV feraheme -will discuss with our pharmacist and give IV multi-vitamine, copper and zinc replacement in the next few weeks    2.  Other vitamin deficiency -She has  received 600K U Vit D and 50K U VitA at Dr. Lucinda Dell office a month ago, now on high oral dose  -We'll repeat her vitamin K and vitamin A level again - Will discuss with our pharmacy to see if we can replace her vitamins through IV.  3. Hypocalcemia - hypocalcemia could be related to her vitamin D deficiency, she'll continue oral calcium and vitamin D. -Her hypokalemia has resolved - follow-up her level on next visit  4. Menorrhagia -She plans to have a IUD placed, however is not covered by her insurance, and she has to wait until October when she is able to afford it -Continue iron replacement   She will continue to follow-up with her primary care physician for  other medical issues.  --RTC in 2 months for repeat labs (CBC and iron study) one week before.  All questions were answered. The patient knows to call the clinic with any problems, questions or concerns. We can certainly see the patient much sooner if necessary.  I spent 20 minutes counseling the patient face to face. The total time spent in the appointment was 20  minutes.    Truitt Merle, MD 11/25/2014 4:52 PM

## 2014-11-25 NOTE — Telephone Encounter (Signed)
Pt confirmed labs/ov per 07/29 POF, gave pt AVS and Calendar... KJ, sent msg to add [facility]

## 2014-11-25 NOTE — Telephone Encounter (Signed)
Per staff message and POF I have scheduled appts. Advised scheduler of appts. JMW  

## 2014-11-28 ENCOUNTER — Telehealth: Payer: Self-pay | Admitting: Hematology

## 2014-11-28 LAB — FERRITIN CHCC: Ferritin: 301 ng/ml — ABNORMAL HIGH (ref 9–269)

## 2014-11-28 LAB — IRON AND TIBC CHCC
%SAT: 34 % (ref 21–57)
Iron: 103 ug/dL (ref 41–142)
TIBC: 300 ug/dL (ref 236–444)
UIBC: 197 ug/dL (ref 120–384)

## 2014-11-28 NOTE — Telephone Encounter (Signed)
S/w pt confirming Feraheme for the next 2 weeks D/T, pt will p/u updated calendar at next visit... KJ

## 2014-11-30 LAB — VITAMIN A: Vitamin A (Retinoic Acid): 35 ug/dL — ABNORMAL LOW (ref 38–98)

## 2014-11-30 LAB — VITAMIN D 25 HYDROXY (VIT D DEFICIENCY, FRACTURES): Vit D, 25-Hydroxy: 30 ng/mL (ref 30–100)

## 2014-12-01 ENCOUNTER — Telehealth: Payer: Self-pay | Admitting: Hematology

## 2014-12-01 ENCOUNTER — Ambulatory Visit (HOSPITAL_BASED_OUTPATIENT_CLINIC_OR_DEPARTMENT_OTHER): Payer: Commercial Managed Care - HMO | Admitting: Hematology

## 2014-12-01 ENCOUNTER — Encounter: Payer: Self-pay | Admitting: Hematology

## 2014-12-01 ENCOUNTER — Other Ambulatory Visit: Payer: Self-pay | Admitting: *Deleted

## 2014-12-01 ENCOUNTER — Ambulatory Visit: Payer: Commercial Managed Care - HMO

## 2014-12-01 VITALS — BP 128/81 | HR 85 | Temp 99.0°F | Resp 18 | Ht 66.0 in | Wt 205.3 lb

## 2014-12-01 DIAGNOSIS — N920 Excessive and frequent menstruation with regular cycle: Secondary | ICD-10-CM

## 2014-12-01 DIAGNOSIS — E610 Copper deficiency: Secondary | ICD-10-CM | POA: Diagnosis not present

## 2014-12-01 DIAGNOSIS — E568 Deficiency of other vitamins: Secondary | ICD-10-CM

## 2014-12-01 DIAGNOSIS — D509 Iron deficiency anemia, unspecified: Secondary | ICD-10-CM

## 2014-12-01 DIAGNOSIS — E509 Vitamin A deficiency, unspecified: Secondary | ICD-10-CM

## 2014-12-01 DIAGNOSIS — E559 Vitamin D deficiency, unspecified: Secondary | ICD-10-CM

## 2014-12-01 MED ORDER — SODIUM CHLORIDE 0.9 % IV SOLN
INTRAVENOUS | Status: DC
  Filled 2014-12-01: qty 1000

## 2014-12-01 MED ORDER — SODIUM CHLORIDE 0.9 % IV SOLN: INTRAVENOUS | Status: DC

## 2014-12-01 NOTE — Progress Notes (Signed)
[place] OFFICE PROGRESS NOTE  Mackenzie Arnold, FNP [address] [place] [address]  DIAGNOSIS: Copper deficiency - Plan: sodium chloride 0.9 % 250 mL with copper sulfate 2 mg infusion  Vitamin D deficiency - Plan: sodium chloride 0.9 % 1,000 mL with multivitamins adult (MVI -12) infusion  Vitamin A deficiency  Iron deficiency anemia  No chief complaint on file.   CURRENT THERAPY:  Feraheme , every 3 months , as needed, MVI and copper IV replacement started in Aug 2016   INTERVAL HISTORY: Mackenzie [name] 39 y.o. female with a history of blindness, obesity s/p gastric bypass, who presents along with her mother for follow-up for her multiple nutritional deficiency. She is here to discuss her lab test result from last week.  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 She also had a history of morbid obesity and had attempted to have bariatric surgery in [place] by Dr. Lorin [name]. Medical records indicated she had a duodenal switch procedure done in 2007. Her anemia, she reports, has worsened since her gastric bypass surgery.   No significant change in the past week, She feels about the same.    MEDICAL HISTORY: Past Medical History  Diagnosis Date  . Breast lump left  . Blind 1994  . Blood transfusion 04/2010    cancer center at [facility] long - iron transfusion per pt  . Pseudotumor cerebri 1994    History - caused blind  . 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 Anemia     history  . H/O hiatal hernia     repaired with weight loss surgery  . Depression     no meds  . History of uterine fibroid     ALLERGIES:  is allergic to influenza vaccines.  MEDICATIONS: has a current medication list which  includes the following prescription(s): calcium carbonate, cholecalciferol, clonazepam, cyanocobalamin, ibuprofen, norethindrone-ethinyl estradiol, omeprazole, potassium chloride sa, prenatal vitamin w/fe, fa, PRESCRIPTION MEDICATION, probiotic product, and vitamin a, and the following Facility-Administered Medications: sodium chloride 0.9 % 1,000 mL with multivitamins adult (MVI -12) infusion and sodium chloride 0.9 % 250 mL with copper sulfate 2 mg infusion.  SURGICAL HISTORY:  Past Surgical History  Procedure Laterality Date  . Eye decompression x3  1994    Bilateral   . Lp shunt  1994    removed in 2009  . Rurod switch  2007    weight loss surgery done in [place] (duodenal switch)  . Fetal blood transfusion  jan 29,2012  . Breast surgery  2012    right breast lumpectomy  . Appendectomy      removed with switch surgery  . Cholecystectomy      removed with switch surgery  . Myomectomy      REVIEW OF SYSTEMS:   Constitutional: Denies fevers, chills or abnormal weight loss,  Mild fatigue Eyes: Denies blurriness of vision Ears, nose, mouth, throat, and face: Denies mucositis or sore throat Respiratory: Denies cough, dyspnea or wheezes Cardiovascular: Denies palpitation, chest discomfort or lower extremity swelling Gastrointestinal:  Denies nausea, heartburn or change in bowel habits Skin: Denies abnormal skin rashes Lymphatics: Denies new lymphadenopathy or easy bruising Neurological:Denies numbness, tingling or new weaknesses Behavioral/Psych: Mood is stable, no new changes  All other systems  were reviewed with the patient and are negative.  PHYSICAL EXAMINATION: ECOG PERFORMANCE STATUS: 1  Blood pressure 128/81, pulse 85, temperature 99 F (37.2 C), temperature source Oral, resp. rate 18, height 5\' 6"  (1.676 m), weight 205 lb 4.8 oz (93.123 kg), SpO2 100 %.  GENERAL:alert, no distress and comfortable; blind, alopecia and obese SKIN: skin color, texture, turgor are normal,  no rashes or significant lesions EYES: normal, Conjunctiva are pink and non-injected, sclera clear OROPHARYNX:no exudate, no erythema and lips, buccal mucosa, and tongue normal  NECK: supple, thyroid normal size, non-tender, without nodularity LYMPH:  no palpable lymphadenopathy in the cervical, axillary or supraclavicular LUNGS: clear to auscultation and percussion with normal breathing effort HEART: regular rate & rhythm and no murmurs and no lower extremity edema ABDOMEN:abdomen soft, non-tender and normal bowel sounds Musculoskeletal:no cyanosis of digits and no clubbing  NEURO: alert & oriented x 3 with fluent speech, no focal motor/sensory deficits   LABORATORY DATA: CBC Latest Ref Rng 11/25/2014 09/14/2014 09/10/2014  WBC 3.9 - 10.3 10e3/uL 9.1 10.5 11.4(H)  Hemoglobin 11.6 - 15.9 g/dL 11.2(L) 11.1(L) 11.7(L)  Hematocrit 34.8 - 46.6 % 34.5(L) 33.8(L) 37.1  Platelets 145 - 400 10e3/uL 219 188.0 194   MCV 102.4 today   CMP Latest Ref Rng 11/25/2014 09/14/2014 09/14/2014  Glucose 70 - 140 mg/dl 78 - 74  BUN 7.0 - 26.0 mg/dL 16.6 - 10  Creatinine 0.6 - 1.1 mg/dL 0.9 - 0.59  Sodium 136 - 145 mEq/L 138 - 137  Potassium 3.5 - 5.1 mEq/L 4.3 - 3.7  Chloride 96 - 112 mEq/L - - 104  CO2 22 - 29 mEq/L 25 - 30  Calcium 8.4 - 10.4 mg/dL 8.8 8.8 8.8  Total Protein 6.4 - 8.3 g/dL 7.6 - 7.1  Total Bilirubin 0.20 - 1.20 mg/dL 0.54 - 0.5  Alkaline Phos 40 - 150 U/L 50 - 53  AST 5 - 34 U/L 26 - 25  ALT 0 - 55 U/L 25 - 30   Results for Mackenzie [name] (MRN [id number]) as of 12/01/2014 14:51  Ref. Range 11/18/2014 13:55 11/25/2014 15:35 11/25/2014 15:35 11/25/2014 15:36  Iron Latest Ref Range: 41-142 ug/dL   103   UIBC Latest Ref Range: 120-384 ug/dL   197   TIBC Latest Ref Range: 236-444 ug/dL   300   %SAT Latest Ref Range: 21-57 %   34   Ferritin Latest Ref Range: 9-269 ng/ml   301 (H)   Copper Latest Ref Range: 70-175 mcg/dL 65 (L)     Vit D, 25-Hydroxy Latest Ref Range: 30-100 ng/mL    30   Zinc Latest Ref Range: 60-130 mcg/dL 52 (L)       Anemia work up No results for input(s): VITAMINB12, FOLATE, FERRITIN, TIBC, IRON, RETICCTPCT in the last 72 hours.   RADIOGRAPHIC STUDIES: No new study   ASSESSMENT: Mackenzie [name] 39 y.o. female with a history of iron deficient anemia and multiple nutritional deficiency due to her prior duodenal switch procedure in 2007.  PLAN:   1. Nutritional anemia. - she was diagnosed with iron deficient anemia secondary to her duodenal switch procedure, and heavy menstrual period. She responded well to IV iron. - she now has macrocytic anemia,  but the V78 and folic acid level are normal -her lab work also showed low Burt Knack and think level, which may also contribute to her anemia -Her hemoglobin is 11.2, did not correct completely by IV iron. She likely has as a component of anemia,  possible other nutritional secondary to her duodenal switch procedure -Her ferritin and iron studies from last week were normal -I recommend replace her Copper and the zinc. I'll give Copper 2mg  in 250 ML normal saline infusion every week for 4 weeks -I suggest her take a multivitamin with minerals, which contains Copper about 1-3mg  a day, and zinc 100 mg per day -I also recommend her to take ferrous sulfate by mouth 1-2 tablets a day, she agrees   2.  Other vitamin deficiency -She has received 600K U Vit D and 50K U VitA at Dr. Lucinda Dell office a month ago, now on high oral dose  -Repeated vitamin A and K level are still slightly below normal range -I recommend multivitamin 36ml (containing vitamin 400 unit, vitamin A 6600 unit) intravenous infusion once and follow up levels   3. Hypocalcemia - hypocalcemia could be related to her vitamin D deficiency, she'll continue oral calcium and vitamin D. -Her hypokalemia has resolved - follow-up her level on next visit  4. Menorrhagia -She plans to have a IUD placed, however is not covered by her insurance, and  she has to wait until October when she is able to afford it -Continue iron replacement, ferrous sulfate 1-2 tablets a dayshe is willing to take    She will continue to follow-up with her primary care physician for  other medical issues.  Plan -IV MVI X1 and copper X4 --RTC in 2 months for repeat labs (CBC and iron study) one week before.   All questions were answered. The patient knows to call the clinic with any problems, questions or concerns. We can certainly see the patient much sooner if necessary.  I spent 20 minutes counseling the patient face to face. The total time spent in the appointment was 20 minutes.    Truitt Merle, MD 12/01/2014 2:49 PM

## 2014-12-01 NOTE — Telephone Encounter (Signed)
s.w. pt and confirmed todays MD visit....pt ok and aware

## 2014-12-01 NOTE — Telephone Encounter (Signed)
returned call and confirmed appts.Marland KitchenMarland KitchenMarland Kitchen

## 2014-12-01 NOTE — Telephone Encounter (Signed)
Pt confirmed labs/ov per 08/04 POF, gave pt avs and calendar.... KJ, sent msg to add MVI and Copper infusion

## 2014-12-02 ENCOUNTER — Telehealth: Payer: Self-pay | Admitting: *Deleted

## 2014-12-02 DIAGNOSIS — K219 Gastro-esophageal reflux disease without esophagitis: Secondary | ICD-10-CM | POA: Diagnosis not present

## 2014-12-02 DIAGNOSIS — Z9884 Bariatric surgery status: Secondary | ICD-10-CM | POA: Diagnosis not present

## 2014-12-02 NOTE — Telephone Encounter (Signed)
Per POF appt to be 8/11, no available moved to 8/12. Advised scheduler

## 2014-12-02 NOTE — Telephone Encounter (Signed)
Per staff message and POF I have scheduled appts. Advised scheduler of appts. JMW  

## 2014-12-07 ENCOUNTER — Other Ambulatory Visit: Payer: Self-pay | Admitting: Gastroenterology

## 2014-12-07 DIAGNOSIS — Z9889 Other specified postprocedural states: Secondary | ICD-10-CM | POA: Diagnosis not present

## 2014-12-07 DIAGNOSIS — K219 Gastro-esophageal reflux disease without esophagitis: Secondary | ICD-10-CM | POA: Diagnosis not present

## 2014-12-07 DIAGNOSIS — K317 Polyp of stomach and duodenum: Secondary | ICD-10-CM | POA: Diagnosis not present

## 2014-12-08 ENCOUNTER — Ambulatory Visit: Payer: Commercial Managed Care - HMO

## 2014-12-09 ENCOUNTER — Ambulatory Visit (HOSPITAL_BASED_OUTPATIENT_CLINIC_OR_DEPARTMENT_OTHER): Payer: Commercial Managed Care - HMO

## 2014-12-09 VITALS — BP 125/83 | HR 76 | Temp 98.0°F | Resp 18

## 2014-12-09 DIAGNOSIS — E568 Deficiency of other vitamins: Secondary | ICD-10-CM

## 2014-12-09 DIAGNOSIS — E610 Copper deficiency: Secondary | ICD-10-CM

## 2014-12-09 DIAGNOSIS — D509 Iron deficiency anemia, unspecified: Secondary | ICD-10-CM

## 2014-12-09 MED ORDER — DIPHENHYDRAMINE HCL 25 MG PO CAPS
ORAL_CAPSULE | ORAL | Status: AC
  Filled 2014-12-09: qty 1

## 2014-12-09 MED ORDER — SODIUM CHLORIDE 0.9 % IV SOLN
Freq: Once | INTRAVENOUS | Status: AC
  Administered 2014-12-09: 10:00:00 via INTRAVENOUS
  Filled 2014-12-09: qty 1000

## 2014-12-09 MED ORDER — SODIUM CHLORIDE 0.9 % IV SOLN
510.0000 mg | Freq: Once | INTRAVENOUS | Status: AC
  Administered 2014-12-09: 510 mg via INTRAVENOUS
  Filled 2014-12-09: qty 17

## 2014-12-09 MED ORDER — DIPHENHYDRAMINE HCL 25 MG PO CAPS
25.0000 mg | ORAL_CAPSULE | Freq: Once | ORAL | Status: AC
  Administered 2014-12-09: 25 mg via ORAL

## 2014-12-09 MED ORDER — HEPARIN SOD (PORK) LOCK FLUSH 100 UNIT/ML IV SOLN
250.0000 [IU] | Freq: Once | INTRAVENOUS | Status: DC | PRN
  Filled 2014-12-09: qty 5

## 2014-12-09 NOTE — Patient Instructions (Signed)

## 2014-12-14 ENCOUNTER — Telehealth: Payer: Self-pay | Admitting: Hematology

## 2014-12-14 NOTE — Telephone Encounter (Signed)
perpof to sch 1 more inf-sent MW emailt os ch-will call pt once reply

## 2014-12-15 ENCOUNTER — Telehealth: Payer: Self-pay | Admitting: *Deleted

## 2014-12-15 ENCOUNTER — Other Ambulatory Visit: Payer: Self-pay | Admitting: Hematology

## 2014-12-15 ENCOUNTER — Ambulatory Visit (HOSPITAL_BASED_OUTPATIENT_CLINIC_OR_DEPARTMENT_OTHER): Payer: Commercial Managed Care - HMO

## 2014-12-15 VITALS — BP 115/75 | HR 88 | Temp 98.7°F | Resp 20

## 2014-12-15 DIAGNOSIS — E441 Mild protein-calorie malnutrition: Secondary | ICD-10-CM

## 2014-12-15 DIAGNOSIS — E610 Copper deficiency: Secondary | ICD-10-CM | POA: Diagnosis not present

## 2014-12-15 DIAGNOSIS — D509 Iron deficiency anemia, unspecified: Secondary | ICD-10-CM

## 2014-12-15 MED ORDER — SODIUM CHLORIDE 0.9 % IV SOLN
Freq: Once | INTRAVENOUS | Status: AC
  Administered 2014-12-15: 09:00:00 via INTRAVENOUS
  Filled 2014-12-15: qty 250

## 2014-12-15 MED ORDER — ACETAMINOPHEN 325 MG PO TABS: 650.0000 mg | ORAL_TABLET | Freq: Once | ORAL | Status: DC

## 2014-12-15 MED ORDER — DIPHENHYDRAMINE HCL 25 MG PO CAPS
25.0000 mg | ORAL_CAPSULE | Freq: Once | ORAL | Status: AC
  Administered 2014-12-15: 25 mg via ORAL

## 2014-12-15 MED ORDER — SODIUM CHLORIDE 0.9 % IV SOLN: Freq: Once | INTRAVENOUS | Status: DC

## 2014-12-15 MED ORDER — DIPHENHYDRAMINE HCL 25 MG PO CAPS
ORAL_CAPSULE | ORAL | Status: AC
  Filled 2014-12-15: qty 1

## 2014-12-15 NOTE — Telephone Encounter (Signed)
Per staff message and POF I have scheduled appts. Advised scheduler of appts. JMW  

## 2014-12-15 NOTE — Patient Instructions (Signed)
Anemia, Nonspecific Anemia is a condition in which the concentration of red blood cells or hemoglobin in the blood is below normal. Hemoglobin is a substance in red blood cells that carries oxygen to the tissues of the body. Anemia results in not enough oxygen reaching these tissues.  CAUSES  Common causes of anemia include:   Excessive bleeding. Bleeding may be internal or external. This includes excessive bleeding from periods (in women) or from the intestine.   Poor nutrition.   Chronic kidney, thyroid, and liver disease.  Bone marrow disorders that decrease red blood cell production.  Cancer and treatments for cancer.  HIV, AIDS, and their treatments.  Spleen problems that increase red blood cell destruction.  Blood disorders.  Excess destruction of red blood cells due to infection, medicines, and autoimmune disorders. SIGNS AND SYMPTOMS   Minor weakness.   Dizziness.   Headache.  Palpitations.   Shortness of breath, especially with exercise.   Paleness.  Cold sensitivity.  Indigestion.  Nausea.  Difficulty sleeping.  Difficulty concentrating. Symptoms may occur suddenly or they may develop slowly.  DIAGNOSIS  Additional blood tests are often needed. These help your health care provider determine the best treatment. Your health care provider will check your stool for blood and look for other causes of blood loss.  TREATMENT  Treatment varies depending on the cause of the anemia. Treatment can include:   Supplements of iron, vitamin B12, or folic acid.   Hormone medicines.   A blood transfusion. This may be needed if blood loss is severe.   Hospitalization. This may be needed if there is significant continual blood loss.   Dietary changes.  Spleen removal. HOME CARE INSTRUCTIONS Keep all follow-up appointments. It often takes many weeks to correct anemia, and having your health care provider check on your condition and your response to  treatment is very important. SEEK IMMEDIATE MEDICAL CARE IF:   You develop extreme weakness, shortness of breath, or chest pain.   You become dizzy or have trouble concentrating.  You develop heavy vaginal bleeding.   You develop a rash.   You have bloody or black, tarry stools.   You faint.   You vomit up blood.   You vomit repeatedly.   You have abdominal pain.  You have a fever or persistent symptoms for more than 2-3 days.   You have a fever and your symptoms suddenly get worse.   You are dehydrated.  MAKE SURE YOU:  Understand these instructions.  Will watch your condition.  Will get help right away if you are not doing well or get worse. Document Released: 05/23/2004 Document Revised: 12/16/2012 Document Reviewed: 10/09/2012 ExitCare Patient Information 2015 [employer]. This information is not intended to replace advice given to you by your health care provider. Make sure you discuss any questions you have with your health care provider.  

## 2014-12-15 NOTE — Progress Notes (Signed)
Per Dr. Burr Medico, pt to have Benadryl only prior to Copper infusion.

## 2014-12-19 ENCOUNTER — Telehealth: Payer: Self-pay | Admitting: Hematology

## 2014-12-19 NOTE — Telephone Encounter (Signed)
per reply from Michelle-cld pt and left inf appt times & dates

## 2014-12-22 ENCOUNTER — Ambulatory Visit (HOSPITAL_BASED_OUTPATIENT_CLINIC_OR_DEPARTMENT_OTHER): Payer: Commercial Managed Care - HMO

## 2014-12-22 VITALS — BP 118/76 | HR 74 | Temp 98.4°F

## 2014-12-22 DIAGNOSIS — E610 Copper deficiency: Secondary | ICD-10-CM

## 2014-12-22 DIAGNOSIS — D509 Iron deficiency anemia, unspecified: Secondary | ICD-10-CM

## 2014-12-22 MED ORDER — DIPHENHYDRAMINE HCL 25 MG PO CAPS
25.0000 mg | ORAL_CAPSULE | Freq: Once | ORAL | Status: AC
  Administered 2014-12-22: 25 mg via ORAL

## 2014-12-22 MED ORDER — SODIUM CHLORIDE 0.9 % IV SOLN
Freq: Once | INTRAVENOUS | Status: AC
  Administered 2014-12-22: 09:00:00 via INTRAVENOUS
  Filled 2014-12-22: qty 250

## 2014-12-22 MED ORDER — DIPHENHYDRAMINE HCL 25 MG PO CAPS
ORAL_CAPSULE | ORAL | Status: AC
  Filled 2014-12-22: qty 1

## 2014-12-22 NOTE — Patient Instructions (Signed)
Anemia, Nonspecific Anemia is a condition in which the concentration of red blood cells or hemoglobin in the blood is below normal. Hemoglobin is a substance in red blood cells that carries oxygen to the tissues of the body. Anemia results in not enough oxygen reaching these tissues.  CAUSES  Common causes of anemia include:   Excessive bleeding. Bleeding may be internal or external. This includes excessive bleeding from periods (in women) or from the intestine.   Poor nutrition.   Chronic kidney, thyroid, and liver disease.  Bone marrow disorders that decrease red blood cell production.  Cancer and treatments for cancer.  HIV, AIDS, and their treatments.  Spleen problems that increase red blood cell destruction.  Blood disorders.  Excess destruction of red blood cells due to infection, medicines, and autoimmune disorders. SIGNS AND SYMPTOMS   Minor weakness.   Dizziness.   Headache.  Palpitations.   Shortness of breath, especially with exercise.   Paleness.  Cold sensitivity.  Indigestion.  Nausea.  Difficulty sleeping.  Difficulty concentrating. Symptoms may occur suddenly or they may develop slowly.  DIAGNOSIS  Additional blood tests are often needed. These help your health care provider determine the best treatment. Your health care provider will check your stool for blood and look for other causes of blood loss.  TREATMENT  Treatment varies depending on the cause of the anemia. Treatment can include:   Supplements of iron, vitamin B12, or folic acid.   Hormone medicines.   A blood transfusion. This may be needed if blood loss is severe.   Hospitalization. This may be needed if there is significant continual blood loss.   Dietary changes.  Spleen removal. HOME CARE INSTRUCTIONS Keep all follow-up appointments. It often takes many weeks to correct anemia, and having your health care provider check on your condition and your response to  treatment is very important. SEEK IMMEDIATE MEDICAL CARE IF:   You develop extreme weakness, shortness of breath, or chest pain.   You become dizzy or have trouble concentrating.  You develop heavy vaginal bleeding.   You develop a rash.   You have bloody or black, tarry stools.   You faint.   You vomit up blood.   You vomit repeatedly.   You have abdominal pain.  You have a fever or persistent symptoms for more than 2-3 days.   You have a fever and your symptoms suddenly get worse.   You are dehydrated.  MAKE SURE YOU:  Understand these instructions.  Will watch your condition.  Will get help right away if you are not doing well or get worse. Document Released: 05/23/2004 Document Revised: 12/16/2012 Document Reviewed: 10/09/2012 ExitCare Patient Information 2015 [employer]. This information is not intended to replace advice given to you by your health care provider. Make sure you discuss any questions you have with your health care provider.  

## 2014-12-29 ENCOUNTER — Ambulatory Visit (HOSPITAL_BASED_OUTPATIENT_CLINIC_OR_DEPARTMENT_OTHER): Payer: Commercial Managed Care - HMO

## 2014-12-29 ENCOUNTER — Other Ambulatory Visit: Payer: Self-pay | Admitting: Hematology

## 2014-12-29 VITALS — BP 135/74 | HR 78 | Temp 97.7°F | Resp 16

## 2014-12-29 DIAGNOSIS — E610 Copper deficiency: Secondary | ICD-10-CM | POA: Diagnosis not present

## 2014-12-29 DIAGNOSIS — D509 Iron deficiency anemia, unspecified: Secondary | ICD-10-CM

## 2014-12-29 MED ORDER — SODIUM CHLORIDE 0.9 % IV SOLN
INTRAVENOUS | Status: DC
  Administered 2014-12-29: 09:00:00 via INTRAVENOUS

## 2014-12-29 MED ORDER — SODIUM CHLORIDE 0.9 % IV SOLN
Freq: Once | INTRAVENOUS | Status: AC
  Administered 2014-12-29: 10:00:00 via INTRAVENOUS
  Filled 2014-12-29: qty 250

## 2014-12-29 MED ORDER — DIPHENHYDRAMINE HCL 25 MG PO CAPS
25.0000 mg | ORAL_CAPSULE | Freq: Once | ORAL | Status: AC
  Administered 2014-12-29: 25 mg via ORAL

## 2014-12-29 MED ORDER — DIPHENHYDRAMINE HCL 25 MG PO CAPS
ORAL_CAPSULE | ORAL | Status: AC
  Filled 2014-12-29: qty 1

## 2014-12-29 NOTE — Patient Instructions (Signed)
Iron Deficiency Anemia Anemia is a condition in which there are less red blood cells or hemoglobin in the blood than normal. Hemoglobin is the part of red blood cells that carries oxygen. Iron deficiency anemia is anemia caused by too little iron. It is the most common type of anemia. It may leave you tired and short of breath. CAUSES   Lack of iron in the diet.  Poor absorption of iron, as seen with intestinal disorders.  Intestinal bleeding.  Heavy periods. SIGNS AND SYMPTOMS  Mild anemia may not be noticeable. Symptoms may include:  Fatigue.  Headache.  Pale skin.  Weakness.  Tiredness.  Shortness of breath.  Dizziness.  Cold hands and feet.  Fast or irregular heartbeat. DIAGNOSIS  Diagnosis requires a thorough evaluation and physical exam by your health care provider. Blood tests are generally used to confirm iron deficiency anemia. Additional tests may be done to find the underlying cause of your anemia. These may include:  Testing for blood in the stool (fecal occult blood test).  A procedure to see inside the colon and rectum (colonoscopy).  A procedure to see inside the esophagus and stomach (endoscopy). TREATMENT  Iron deficiency anemia is treated by correcting the cause of the deficiency. Treatment may involve:  Adding iron-rich foods to your diet.  Taking iron supplements. Pregnant or breastfeeding women need to take extra iron because their normal diet usually does not provide the required amount.  Taking vitamins. Vitamin C improves the absorption of iron. Your health care provider may recommend that you take your iron tablets with a glass of orange juice or vitamin C supplement.  Medicines to make heavy menstrual flow lighter.  Surgery. HOME CARE INSTRUCTIONS   Take iron as directed by your health care provider.  If you cannot tolerate taking iron supplements by mouth, talk to your health care provider about taking them through a vein  (intravenously) or an injection into a muscle.  For the best iron absorption, iron supplements should be taken on an empty stomach. If you cannot tolerate them on an empty stomach, you may need to take them with food.  Do not drink milk or take antacids at the same time as your iron supplements. Milk and antacids may interfere with the absorption of iron.  Iron supplements can cause constipation. Make sure to include fiber in your diet to prevent constipation. A stool softener may also be recommended.  Take vitamins as directed by your health care provider.  Eat a diet rich in iron. Foods high in iron include liver, lean beef, whole-grain bread, eggs, dried fruit, and dark green leafy vegetables. SEEK IMMEDIATE MEDICAL CARE IF:   You faint. If this happens, do not drive. Call your local emergency services (911 in [place] if no other help is available.  You have chest pain.  You feel nauseous or vomit.  You have severe or increased shortness of breath with activity.  You feel weak.  You have a rapid heartbeat.  You have unexplained sweating.  You become light-headed when getting up from a chair or bed. MAKE SURE YOU:   Understand these instructions.  Will watch your condition.  Will get help right away if you are not doing well or get worse. Document Released: 04/12/2000 Document Revised: 04/20/2013 Document Reviewed: 12/21/2012 Exit[employer] Patient Information 2015 [employer]. This information is not intended to replace advice given to you by your health care provider. Make sure you discuss any questions you have with your health care provider.  

## 2015-01-03 DIAGNOSIS — E509 Vitamin A deficiency, unspecified: Secondary | ICD-10-CM | POA: Diagnosis not present

## 2015-01-03 DIAGNOSIS — D509 Iron deficiency anemia, unspecified: Secondary | ICD-10-CM | POA: Diagnosis not present

## 2015-01-03 DIAGNOSIS — E559 Vitamin D deficiency, unspecified: Secondary | ICD-10-CM | POA: Diagnosis not present

## 2015-01-03 DIAGNOSIS — E215 Disorder of parathyroid gland, unspecified: Secondary | ICD-10-CM | POA: Diagnosis not present

## 2015-01-13 ENCOUNTER — Other Ambulatory Visit: Payer: Commercial Managed Care - HMO

## 2015-01-19 ENCOUNTER — Telehealth: Payer: Self-pay | Admitting: Hematology

## 2015-01-19 NOTE — Telephone Encounter (Signed)
pt cld to r/s appt-cld & spoke to pt and adv of r/s time & date

## 2015-01-20 ENCOUNTER — Ambulatory Visit: Payer: Commercial Managed Care - HMO | Admitting: Hematology

## 2015-01-27 ENCOUNTER — Other Ambulatory Visit: Payer: Commercial Managed Care - HMO

## 2015-01-27 ENCOUNTER — Other Ambulatory Visit (HOSPITAL_BASED_OUTPATIENT_CLINIC_OR_DEPARTMENT_OTHER): Payer: Commercial Managed Care - HMO

## 2015-01-27 ENCOUNTER — Ambulatory Visit: Payer: Commercial Managed Care - HMO | Admitting: Hematology

## 2015-01-27 DIAGNOSIS — D509 Iron deficiency anemia, unspecified: Secondary | ICD-10-CM | POA: Diagnosis not present

## 2015-01-27 LAB — CBC & DIFF AND RETIC
BASO%: 0.1 % (ref 0.0–2.0)
Basophils Absolute: 0 10*3/uL (ref 0.0–0.1)
EOS%: 0.8 % (ref 0.0–7.0)
Eosinophils Absolute: 0.1 10*3/uL (ref 0.0–0.5)
HCT: 36.5 % (ref 34.8–46.6)
HGB: 11.6 g/dL (ref 11.6–15.9)
Immature Retic Fract: 5.2 % (ref 1.60–10.00)
LYMPH%: 23.5 % (ref 14.0–49.7)
MCH: 32.5 pg (ref 25.1–34.0)
MCHC: 31.8 g/dL (ref 31.5–36.0)
MCV: 102.2 fL — ABNORMAL HIGH (ref 79.5–101.0)
MONO#: 0.8 10*3/uL (ref 0.1–0.9)
MONO%: 9.8 % (ref 0.0–14.0)
NEUT#: 5.1 10*3/uL (ref 1.5–6.5)
NEUT%: 65.8 % (ref 38.4–76.8)
Platelets: 181 10*3/uL (ref 145–400)
RBC: 3.57 10*6/uL — ABNORMAL LOW (ref 3.70–5.45)
RDW: 12.9 % (ref 11.2–14.5)
Retic %: 3.53 % — ABNORMAL HIGH (ref 0.70–2.10)
Retic Ct Abs: 126.02 10*3/uL — ABNORMAL HIGH (ref 33.70–90.70)
WBC: 7.7 10*3/uL (ref 3.9–10.3)
lymph#: 1.8 10*3/uL (ref 0.9–3.3)

## 2015-01-27 LAB — FERRITIN CHCC: Ferritin: 404 ng/ml — ABNORMAL HIGH (ref 9–269)

## 2015-01-27 LAB — IRON AND TIBC CHCC
%SAT: 54 % (ref 21–57)
Iron: 116 ug/dL (ref 41–142)
TIBC: 214 ug/dL — ABNORMAL LOW (ref 236–444)
UIBC: 98 ug/dL — ABNORMAL LOW (ref 120–384)

## 2015-02-03 ENCOUNTER — Encounter: Payer: Self-pay | Admitting: Hematology

## 2015-02-03 ENCOUNTER — Encounter: Payer: Commercial Managed Care - HMO | Admitting: Hematology

## 2015-02-03 NOTE — Progress Notes (Signed)
This encounter was created in error - please disregard.

## 2015-02-07 ENCOUNTER — Telehealth: Payer: Self-pay | Admitting: Hematology

## 2015-02-07 NOTE — Telephone Encounter (Signed)
no answer/vm...Marland KitchenMarland Kitchen

## 2015-03-09 ENCOUNTER — Telehealth: Payer: Self-pay | Admitting: Hematology

## 2015-03-09 NOTE — Telephone Encounter (Signed)
p[t cld left voicemail needing appt-cld & spoke to pt and sch appt time & date

## 2015-03-27 ENCOUNTER — Encounter: Payer: Self-pay | Admitting: Hematology

## 2015-03-27 ENCOUNTER — Other Ambulatory Visit (HOSPITAL_BASED_OUTPATIENT_CLINIC_OR_DEPARTMENT_OTHER): Payer: Commercial Managed Care - HMO

## 2015-03-27 ENCOUNTER — Ambulatory Visit (HOSPITAL_BASED_OUTPATIENT_CLINIC_OR_DEPARTMENT_OTHER): Payer: Commercial Managed Care - HMO | Admitting: Hematology

## 2015-03-27 VITALS — BP 106/59 | HR 96 | Temp 98.9°F | Resp 19 | Ht 66.0 in | Wt 208.8 lb

## 2015-03-27 DIAGNOSIS — D509 Iron deficiency anemia, unspecified: Secondary | ICD-10-CM | POA: Diagnosis not present

## 2015-03-27 DIAGNOSIS — E559 Vitamin D deficiency, unspecified: Secondary | ICD-10-CM

## 2015-03-27 DIAGNOSIS — E509 Vitamin A deficiency, unspecified: Secondary | ICD-10-CM

## 2015-03-27 LAB — CBC & DIFF AND RETIC
BASO%: 0.2 % (ref 0.0–2.0)
Basophils Absolute: 0 10*3/uL (ref 0.0–0.1)
EOS%: 0.4 % (ref 0.0–7.0)
Eosinophils Absolute: 0 10*3/uL (ref 0.0–0.5)
HCT: 36.4 % (ref 34.8–46.6)
HGB: 11.7 g/dL (ref 11.6–15.9)
Immature Retic Fract: 5.8 % (ref 1.60–10.00)
LYMPH%: 20.7 % (ref 14.0–49.7)
MCH: 32.2 pg (ref 25.1–34.0)
MCHC: 32.1 g/dL (ref 31.5–36.0)
MCV: 100.3 fL (ref 79.5–101.0)
MONO#: 0.5 10*3/uL (ref 0.1–0.9)
MONO%: 5.7 % (ref 0.0–14.0)
NEUT#: 6.2 10*3/uL (ref 1.5–6.5)
NEUT%: 73 % (ref 38.4–76.8)
Platelets: 187 10*3/uL (ref 145–400)
RBC: 3.63 10*6/uL — ABNORMAL LOW (ref 3.70–5.45)
RDW: 12.5 % (ref 11.2–14.5)
Retic %: 2.71 % — ABNORMAL HIGH (ref 0.70–2.10)
Retic Ct Abs: 98.37 10*3/uL — ABNORMAL HIGH (ref 33.70–90.70)
WBC: 8.5 10*3/uL (ref 3.9–10.3)
lymph#: 1.8 10*3/uL (ref 0.9–3.3)

## 2015-03-27 LAB — IRON AND TIBC CHCC
%SAT: 29 % (ref 21–57)
Iron: 66 ug/dL (ref 41–142)
TIBC: 229 ug/dL — ABNORMAL LOW (ref 236–444)
UIBC: 163 ug/dL (ref 120–384)

## 2015-03-27 LAB — FERRITIN CHCC: Ferritin: 276 ng/ml — ABNORMAL HIGH (ref 9–269)

## 2015-03-27 NOTE — Progress Notes (Signed)
[facility] OFFICE PROGRESS NOTE  Kennyth Arnold, FNP [address] [place] [address]  DIAGNOSIS: Iron deficiency anemia  Vitamin A deficiency  Vitamin D deficiency  No chief complaint on file.  OTHER MEDICAL ISSUES: 1. History of pseudotumor cerebri which developed in 1994 attributed to a viral illness, at which time she also had Guillain-Barre.This resulted in blindness and also required a ventriculoperitoneal shunt which was removed. 2. History of morbid obesity and had attempted to have bariatric surgery in [place] by Dr. Lorin Picket. Medical records indicated she had a duodenal switch procedure done in 2007.  CURRENT THERAPY:  Feraheme , every 3 months , as needed, MVI and copper IV replacement started in Aug 2016   INTERVAL HISTORY: Mackenzie [name] 39 y.o. female with a history of blindness, obesity s/p gastric bypass, who presents along with her mother for follow-up for her multiple nutritional deficiency.   She is clinically stable, doing moderately well. He still has mild to moderate fatigue, she did not feel any improvement after the intravenous total colectomy infusion and copper infusion about 3-4 months ago. She still has very heavy menstrual period, she is planning to have a IUD placed latest months. She is also scheduled to see her plastic surgeon Dr.Keshishian in [place] in 2 days. She denies any other new complaints.   MEDICAL HISTORY: Past Medical History  Diagnosis Date  . Breast lump left  . Blind 1994  . Blood transfusion 04/2010    cancer center at [facility] long - iron transfusion per pt  . Pseudotumor cerebri 1994    History - caused blind  . Neuromuscular disorder ([place])     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  . Anemia     history  . H/O hiatal hernia     repaired with weight loss surgery  . Depression     no meds  . History of uterine fibroid     ALLERGIES:  is allergic to influenza  vaccines.  MEDICATIONS: has a current medication list which includes the following prescription(s): calcium carbonate, cholecalciferol, clonazepam, cyanocobalamin, ibuprofen, norethindrone-ethinyl estradiol, omeprazole, potassium chloride sa, prenatal vitamin w/fe, fa, PRESCRIPTION MEDICATION, probiotic product, and vitamin a.  SURGICAL HISTORY:  Past Surgical History  Procedure Laterality Date  . Eye decompression x3  1994    Bilateral   . Lp shunt  1994    removed in 2009  . Rurod switch  2007    weight loss surgery done in [place] (duodenal switch)  . Fetal blood transfusion  jan 29,2012  . Breast surgery  2012    right breast lumpectomy  . Appendectomy      removed with switch surgery  . Cholecystectomy      removed with switch surgery  . Myomectomy      REVIEW OF SYSTEMS:   Constitutional: Denies fevers, chills or abnormal weight loss,  Mild fatigue Eyes: Denies blurriness of vision Ears, nose, mouth, throat, and face: Denies mucositis or sore throat Respiratory: Denies cough, dyspnea or wheezes Cardiovascular: Denies palpitation, chest discomfort or lower extremity swelling Gastrointestinal:  Denies nausea, heartburn or change in bowel habits Skin: Denies abnormal skin rashes Lymphatics: Denies new lymphadenopathy or easy bruising Neurological:Denies numbness, tingling or new weaknesses Behavioral/Psych: Mood is stable, no new changes  All other systems were reviewed with the patient and are negative.  PHYSICAL EXAMINATION: ECOG PERFORMANCE STATUS: 1  Blood pressure 106/59, pulse 96, temperature 98.9 F (37.2 C), temperature source Oral, resp. rate  19, height 5\' 6"  (1.676 m), weight 208 lb 12.8 oz (94.711 kg), SpO2 100 %.  GENERAL:alert, no distress and comfortable; blind, alopecia and obese SKIN: skin color, texture, turgor are normal, no rashes or significant lesions EYES: normal, Conjunctiva are pink and non-injected, sclera clear OROPHARYNX:no exudate, no  erythema and lips, buccal mucosa, and tongue normal  NECK: supple, thyroid normal size, non-tender, without nodularity LYMPH:  no palpable lymphadenopathy in the cervical, axillary or supraclavicular LUNGS: clear to auscultation and percussion with normal breathing effort HEART: regular rate & rhythm and no murmurs and no lower extremity edema ABDOMEN:abdomen soft, non-tender and normal bowel sounds Musculoskeletal:no cyanosis of digits and no clubbing  NEURO: alert & oriented x 3 with fluent speech, no focal motor/sensory deficits   LABORATORY DATA: CBC Latest Ref Rng 03/27/2015 01/27/2015 11/25/2014  WBC 3.9 - 10.3 10e3/uL 8.5 7.7 9.1  Hemoglobin 11.6 - 15.9 g/dL 11.7 11.6 11.2(L)  Hematocrit 34.8 - 46.6 % 36.4 36.5 34.5(L)  Platelets 145 - 400 10e3/uL 187 181 219   MCV 102.4 today   CMP Latest Ref Rng 11/25/2014 09/14/2014 09/14/2014  Glucose 70 - 140 mg/dl 78 - 74  BUN 7.0 - 26.0 mg/dL 16.6 - 10  Creatinine 0.6 - 1.1 mg/dL 0.9 - 0.59  Sodium 136 - 145 mEq/L 138 - 137  Potassium 3.5 - 5.1 mEq/L 4.3 - 3.7  Chloride 96 - 112 mEq/L - - 104  CO2 22 - 29 mEq/L 25 - 30  Calcium 8.4 - 10.4 mg/dL 8.8 8.8 8.8  Total Protein 6.4 - 8.3 g/dL 7.6 - 7.1  Total Bilirubin 0.20 - 1.20 mg/dL 0.54 - 0.5  Alkaline Phos 40 - 150 U/L 50 - 53  AST 5 - 34 U/L 26 - 25  ALT 0 - 55 U/L 25 - 30   Anemia work up  Recent Labs  03/27/15 1411  FERRITIN 276*  TIBC 229*  IRON 66  RETICCTPCT 2.71*     RADIOGRAPHIC STUDIES: No new study   ASSESSMENT: Mackenzie [name] 39 y.o. female with a history of iron deficient anemia and multiple nutritional deficiency due to her prior duodenal switch procedure in 2007.  PLAN:   1. Nutritional anemia. - she was diagnosed with iron deficient anemia secondary to her duodenal switch procedure, and heavy menstrual period. She responded well to IV iron. - she now has macrocytic anemia,  but the 123456 and folic acid level are normal -her lab work also showed low  Cooper and zinc level, which may also contribute to her anemia -Her hemoglobin is 11.7, did not correct completely by IV iron. She likely has as a component of other nutritional deficient anemia, possible secondary to her duodenal switch procedure -Her ferritin and iron studies from today are normal -she received copper and zinc replacement 3 months ago, her anemia did not improve much  -I suggest her continue taking multivitamin with minerals, which contains Copper about 1-3mg  a day, and zinc 100 mg per day -she will also continue oral ferrous sulfate by mouth 1-2 tablets a day   2.  Other vitamin deficiency -She has received 600K U Vit D and 50K U VitA at Dr. Lucinda Dell office a month ago, now on high oral dose  -Repeated vitamin A and K level are still slightly below normal range -she received multivitamin 68ml (containing vitamin 400 unit, vitamin A 6600 unit) intravenous infusion 3 month ago, repeated levels from today is still pending -wait and see if Dr.  Lorin Picket has  any additional recommendations for IV repalcement, she is going to see him in two days   3. Hypocalcemia - hypocalcemia could be related to her vitamin D deficiency, she'll continue oral calcium and vitamin D. -Her hypokalemia has resolved - follow-up her level   4. Menorrhagia -She plans to have a IUD placed, however is not covered by her insurance, and she has to wait until this month when she is able to afford it -Continue iron replacement, ferrous sulfate 1-2 tablets   She will continue to follow-up with her primary care physician for  other medical issues.  Plan -lab in 2 month and I will see her back in 4 month   All questions were answered. The patient knows to call the clinic with any problems, questions or concerns. We can certainly see the patient much sooner if necessary.  I spent 20 minutes counseling the patient face to face. The total time spent in the appointment was 20 minutes.    Truitt Merle,  MD 03/27/2015 5:25 PM

## 2015-03-28 ENCOUNTER — Telehealth: Payer: Self-pay | Admitting: Hematology

## 2015-03-28 ENCOUNTER — Telehealth: Payer: Self-pay | Admitting: *Deleted

## 2015-03-28 NOTE — Telephone Encounter (Signed)
Called pt on both cell and home phones unsuccessfully.   Left message on home voice mail requesting a call back from pt to provide nurse with full name of surgeon in [place] - Dr. Lorin Picket - along with phone and fax numbers for notes to be faxed to the surgeon.

## 2015-03-28 NOTE — Telephone Encounter (Signed)
Spoke with pt and informed pt of lab results done 03/28/15.   Informed pt that lab results along with Dr. Ernestina Penna last office notes were faxed to Dr. Liane Comber in [place] today.  Pt has appt with the surgeon on Thursday 03/30/15. Dr. Lucinda Dell   Fax       [phone]       ;     Phone     [phone].

## 2015-03-28 NOTE — Telephone Encounter (Signed)
per pof to sch pt appt-cld & spoke to pt and gave pt time & date of appts

## 2015-03-30 DIAGNOSIS — Z9884 Bariatric surgery status: Secondary | ICD-10-CM | POA: Diagnosis not present

## 2015-03-30 DIAGNOSIS — E569 Vitamin deficiency, unspecified: Secondary | ICD-10-CM | POA: Diagnosis not present

## 2015-03-30 DIAGNOSIS — K219 Gastro-esophageal reflux disease without esophagitis: Secondary | ICD-10-CM | POA: Diagnosis not present

## 2015-03-30 DIAGNOSIS — E509 Vitamin A deficiency, unspecified: Secondary | ICD-10-CM | POA: Diagnosis not present

## 2015-03-30 DIAGNOSIS — D508 Other iron deficiency anemias: Secondary | ICD-10-CM | POA: Diagnosis not present

## 2015-03-30 DIAGNOSIS — E212 Other hyperparathyroidism: Secondary | ICD-10-CM | POA: Diagnosis not present

## 2015-03-30 LAB — VITAMIN D 25 HYDROXY (VIT D DEFICIENCY, FRACTURES): Vit D, 25-Hydroxy: 30 ng/mL (ref 30–100)

## 2015-03-30 LAB — VITAMIN A: Vitamin A (Retinoic Acid): 30 ug/dL — ABNORMAL LOW (ref 38–98)

## 2015-04-10 DIAGNOSIS — D509 Iron deficiency anemia, unspecified: Secondary | ICD-10-CM | POA: Diagnosis not present

## 2015-04-10 DIAGNOSIS — Z9884 Bariatric surgery status: Secondary | ICD-10-CM | POA: Diagnosis not present

## 2015-04-10 DIAGNOSIS — Z0000 Encounter for general adult medical examination without abnormal findings: Secondary | ICD-10-CM | POA: Diagnosis not present

## 2015-04-10 DIAGNOSIS — E785 Hyperlipidemia, unspecified: Secondary | ICD-10-CM | POA: Diagnosis not present

## 2015-04-18 ENCOUNTER — Other Ambulatory Visit: Payer: Self-pay | Admitting: Obstetrics & Gynecology

## 2015-04-18 DIAGNOSIS — N939 Abnormal uterine and vaginal bleeding, unspecified: Secondary | ICD-10-CM | POA: Diagnosis not present

## 2015-04-18 DIAGNOSIS — Z6833 Body mass index (BMI) 33.0-33.9, adult: Secondary | ICD-10-CM | POA: Diagnosis not present

## 2015-04-18 DIAGNOSIS — E669 Obesity, unspecified: Secondary | ICD-10-CM | POA: Diagnosis not present

## 2015-04-18 DIAGNOSIS — D649 Anemia, unspecified: Secondary | ICD-10-CM | POA: Diagnosis not present

## 2015-05-04 DIAGNOSIS — N939 Abnormal uterine and vaginal bleeding, unspecified: Secondary | ICD-10-CM | POA: Diagnosis not present

## 2015-05-04 DIAGNOSIS — N83209 Unspecified ovarian cyst, unspecified side: Secondary | ICD-10-CM | POA: Diagnosis not present

## 2015-05-04 DIAGNOSIS — Z30430 Encounter for insertion of intrauterine contraceptive device: Secondary | ICD-10-CM | POA: Diagnosis not present

## 2015-05-08 DIAGNOSIS — Z30430 Encounter for insertion of intrauterine contraceptive device: Secondary | ICD-10-CM | POA: Diagnosis not present

## 2015-05-29 ENCOUNTER — Other Ambulatory Visit: Payer: Commercial Managed Care - HMO

## 2015-06-23 ENCOUNTER — Telehealth: Payer: Self-pay | Admitting: Hematology

## 2015-06-23 NOTE — Telephone Encounter (Signed)
pt cld to r/s pt appt-gave pt r/s time & date °

## 2015-06-26 ENCOUNTER — Other Ambulatory Visit (HOSPITAL_BASED_OUTPATIENT_CLINIC_OR_DEPARTMENT_OTHER): Payer: Commercial Managed Care - HMO

## 2015-06-26 DIAGNOSIS — D509 Iron deficiency anemia, unspecified: Secondary | ICD-10-CM

## 2015-06-26 LAB — CBC & DIFF AND RETIC
BASO%: 0.3 % (ref 0.0–2.0)
Basophils Absolute: 0 10*3/uL (ref 0.0–0.1)
EOS%: 0.8 % (ref 0.0–7.0)
Eosinophils Absolute: 0.1 10*3/uL (ref 0.0–0.5)
HCT: 37.7 % (ref 34.8–46.6)
HGB: 12.4 g/dL (ref 11.6–15.9)
Immature Retic Fract: 1.2 % — ABNORMAL LOW (ref 1.60–10.00)
LYMPH%: 18.5 % (ref 14.0–49.7)
MCH: 32.6 pg (ref 25.1–34.0)
MCHC: 32.9 g/dL (ref 31.5–36.0)
MCV: 99.2 fL (ref 79.5–101.0)
MONO#: 0.8 10*3/uL (ref 0.1–0.9)
MONO%: 8.8 % (ref 0.0–14.0)
NEUT#: 6.2 10*3/uL (ref 1.5–6.5)
NEUT%: 71.6 % (ref 38.4–76.8)
Platelets: 162 10*3/uL (ref 145–400)
RBC: 3.8 10*6/uL (ref 3.70–5.45)
RDW: 12.6 % (ref 11.2–14.5)
Retic %: 2.32 % — ABNORMAL HIGH (ref 0.70–2.10)
Retic Ct Abs: 88.16 10*3/uL (ref 33.70–90.70)
WBC: 8.7 10*3/uL (ref 3.9–10.3)
lymph#: 1.6 10*3/uL (ref 0.9–3.3)

## 2015-06-26 LAB — IRON AND TIBC
%SAT: 35 % (ref 21–57)
Iron: 88 ug/dL (ref 41–142)
TIBC: 255 ug/dL (ref 236–444)
UIBC: 167 ug/dL (ref 120–384)

## 2015-06-26 LAB — COMPREHENSIVE METABOLIC PANEL
ALT: 19 U/L (ref 0–55)
AST: 27 U/L (ref 5–34)
Albumin: 4.2 g/dL (ref 3.5–5.0)
Alkaline Phosphatase: 45 U/L (ref 40–150)
Anion Gap: 7 mEq/L (ref 3–11)
BUN: 20.5 mg/dL (ref 7.0–26.0)
CO2: 24 mEq/L (ref 22–29)
Calcium: 8.8 mg/dL (ref 8.4–10.4)
Chloride: 107 mEq/L (ref 98–109)
Creatinine: 0.8 mg/dL (ref 0.6–1.1)
EGFR: >90 mL/min/{1.73_m2} (ref >90)
Glucose: 80 mg/dl (ref 70–140)
Potassium: 4.1 mEq/L (ref 3.5–5.1)
Sodium: 138 mEq/L (ref 136–145)
Total Bilirubin: 0.97 mg/dL (ref 0.20–1.20)
Total Protein: 7.7 g/dL (ref 6.4–8.3)

## 2015-06-26 LAB — FERRITIN: Ferritin: 266 ng/ml (ref 9–269)

## 2015-07-06 DIAGNOSIS — N83202 Unspecified ovarian cyst, left side: Secondary | ICD-10-CM | POA: Diagnosis not present

## 2015-07-06 DIAGNOSIS — Z6831 Body mass index (BMI) 31.0-31.9, adult: Secondary | ICD-10-CM | POA: Diagnosis not present

## 2015-07-06 DIAGNOSIS — E669 Obesity, unspecified: Secondary | ICD-10-CM | POA: Diagnosis not present

## 2015-07-06 DIAGNOSIS — N760 Acute vaginitis: Secondary | ICD-10-CM | POA: Diagnosis not present

## 2015-07-06 DIAGNOSIS — Z30431 Encounter for routine checking of intrauterine contraceptive device: Secondary | ICD-10-CM | POA: Diagnosis not present

## 2015-07-06 DIAGNOSIS — N920 Excessive and frequent menstruation with regular cycle: Secondary | ICD-10-CM | POA: Diagnosis not present

## 2015-07-06 DIAGNOSIS — D509 Iron deficiency anemia, unspecified: Secondary | ICD-10-CM | POA: Diagnosis not present

## 2015-07-18 ENCOUNTER — Other Ambulatory Visit (HOSPITAL_BASED_OUTPATIENT_CLINIC_OR_DEPARTMENT_OTHER): Payer: Commercial Managed Care - HMO

## 2015-07-18 DIAGNOSIS — D509 Iron deficiency anemia, unspecified: Secondary | ICD-10-CM | POA: Diagnosis not present

## 2015-07-18 LAB — CBC & DIFF AND RETIC
BASO%: 0.2 % (ref 0.0–2.0)
Basophils Absolute: 0 10*3/uL (ref 0.0–0.1)
EOS%: 0.5 % (ref 0.0–7.0)
Eosinophils Absolute: 0.1 10*3/uL (ref 0.0–0.5)
HCT: 34 % — ABNORMAL LOW (ref 34.8–46.6)
HGB: 11.1 g/dL — ABNORMAL LOW (ref 11.6–15.9)
Immature Retic Fract: 3.8 % (ref 1.60–10.00)
LYMPH%: 16.8 % (ref 14.0–49.7)
MCH: 32.4 pg (ref 25.1–34.0)
MCHC: 32.6 g/dL (ref 31.5–36.0)
MCV: 99.1 fL (ref 79.5–101.0)
MONO#: 0.8 10*3/uL (ref 0.1–0.9)
MONO%: 7.7 % (ref 0.0–14.0)
NEUT#: 8.1 10*3/uL — ABNORMAL HIGH (ref 1.5–6.5)
NEUT%: 74.8 % (ref 38.4–76.8)
Platelets: 193 10*3/uL (ref 145–400)
RBC: 3.43 10*6/uL — ABNORMAL LOW (ref 3.70–5.45)
RDW: 13.7 % (ref 11.2–14.5)
Retic %: 3.14 % — ABNORMAL HIGH (ref 0.70–2.10)
Retic Ct Abs: 107.7 10*3/uL — ABNORMAL HIGH (ref 33.70–90.70)
WBC: 10.8 10*3/uL — ABNORMAL HIGH (ref 3.9–10.3)
lymph#: 1.8 10*3/uL (ref 0.9–3.3)
nRBC: 0 % (ref 0–0)

## 2015-07-18 LAB — IRON AND TIBC
%SAT: 29 % (ref 21–57)
Iron: 61 ug/dL (ref 41–142)
TIBC: 208 ug/dL — ABNORMAL LOW (ref 236–444)
UIBC: 146 ug/dL (ref 120–384)

## 2015-07-18 LAB — FERRITIN: Ferritin: 233 ng/ml (ref 9–269)

## 2015-07-24 DIAGNOSIS — R040 Epistaxis: Secondary | ICD-10-CM | POA: Diagnosis not present

## 2015-07-25 ENCOUNTER — Encounter: Payer: Commercial Managed Care - HMO | Admitting: Hematology

## 2015-07-25 ENCOUNTER — Encounter: Payer: Self-pay | Admitting: Hematology

## 2015-07-25 NOTE — Progress Notes (Signed)
This encounter was created in error - please disregard.

## 2015-07-26 ENCOUNTER — Telehealth: Payer: Self-pay | Admitting: Hematology

## 2015-07-26 NOTE — Telephone Encounter (Signed)
per pof to r/s appt-cld pt and left a message to cll to r/s missed appt

## 2015-08-23 DIAGNOSIS — R040 Epistaxis: Secondary | ICD-10-CM | POA: Insufficient documentation

## 2015-09-07 ENCOUNTER — Telehealth: Payer: Self-pay | Admitting: Hematology

## 2015-09-07 NOTE — Telephone Encounter (Signed)
pt called to resched missed appt

## 2015-09-08 ENCOUNTER — Telehealth: Payer: Self-pay | Admitting: Hematology

## 2015-09-08 ENCOUNTER — Encounter: Payer: Self-pay | Admitting: Hematology

## 2015-09-08 ENCOUNTER — Ambulatory Visit (HOSPITAL_BASED_OUTPATIENT_CLINIC_OR_DEPARTMENT_OTHER): Payer: Commercial Managed Care - HMO | Admitting: Hematology

## 2015-09-08 ENCOUNTER — Ambulatory Visit (HOSPITAL_BASED_OUTPATIENT_CLINIC_OR_DEPARTMENT_OTHER): Payer: Commercial Managed Care - HMO

## 2015-09-08 VITALS — BP 125/88 | HR 106 | Temp 98.6°F | Resp 18 | Wt 212.4 lb

## 2015-09-08 DIAGNOSIS — E60 Dietary zinc deficiency: Secondary | ICD-10-CM

## 2015-09-08 DIAGNOSIS — D509 Iron deficiency anemia, unspecified: Secondary | ICD-10-CM | POA: Diagnosis not present

## 2015-09-08 DIAGNOSIS — E559 Vitamin D deficiency, unspecified: Secondary | ICD-10-CM | POA: Diagnosis not present

## 2015-09-08 DIAGNOSIS — E610 Copper deficiency: Secondary | ICD-10-CM | POA: Diagnosis not present

## 2015-09-08 DIAGNOSIS — E509 Vitamin A deficiency, unspecified: Secondary | ICD-10-CM | POA: Diagnosis not present

## 2015-09-08 LAB — COMPREHENSIVE METABOLIC PANEL
ALT: 17 U/L (ref 0–55)
AST: 19 U/L (ref 5–34)
Albumin: 4 g/dL (ref 3.5–5.0)
Alkaline Phosphatase: 51 U/L (ref 40–150)
Anion Gap: 6 mEq/L (ref 3–11)
BUN: 15.5 mg/dL (ref 7.0–26.0)
CO2: 25 mEq/L (ref 22–29)
Calcium: 9.1 mg/dL (ref 8.4–10.4)
Chloride: 109 mEq/L (ref 98–109)
Creatinine: 0.7 mg/dL (ref 0.6–1.1)
EGFR: >90 mL/min/{1.73_m2} (ref >90)
Glucose: 75 mg/dl (ref 70–140)
Potassium: 3.9 mEq/L (ref 3.5–5.1)
Sodium: 140 mEq/L (ref 136–145)
Total Bilirubin: 0.63 mg/dL (ref 0.20–1.20)
Total Protein: 7.4 g/dL (ref 6.4–8.3)

## 2015-09-08 LAB — CBC & DIFF AND RETIC
BASO%: 0.2 % (ref 0.0–2.0)
Basophils Absolute: 0 10*3/uL (ref 0.0–0.1)
EOS%: 0.5 % (ref 0.0–7.0)
Eosinophils Absolute: 0.1 10*3/uL (ref 0.0–0.5)
HCT: 36.3 % (ref 34.8–46.6)
HGB: 11.5 g/dL — ABNORMAL LOW (ref 11.6–15.9)
Immature Retic Fract: 4.8 % (ref 1.60–10.00)
LYMPH%: 18.2 % (ref 14.0–49.7)
MCH: 32.4 pg (ref 25.1–34.0)
MCHC: 31.7 g/dL (ref 31.5–36.0)
MCV: 102.3 fL — ABNORMAL HIGH (ref 79.5–101.0)
MONO#: 0.6 10*3/uL (ref 0.1–0.9)
MONO%: 5.8 % (ref 0.0–14.0)
NEUT#: 7.3 10*3/uL — ABNORMAL HIGH (ref 1.5–6.5)
NEUT%: 75.3 % (ref 38.4–76.8)
Platelets: 189 10*3/uL (ref 145–400)
RBC: 3.55 10*6/uL — ABNORMAL LOW (ref 3.70–5.45)
RDW: 13.9 % (ref 11.2–14.5)
Retic %: 3.73 % — ABNORMAL HIGH (ref 0.70–2.10)
Retic Ct Abs: 132.42 10*3/uL — ABNORMAL HIGH (ref 33.70–90.70)
WBC: 9.7 10*3/uL (ref 3.9–10.3)
lymph#: 1.8 10*3/uL (ref 0.9–3.3)

## 2015-09-08 LAB — FERRITIN: Ferritin: 232 ng/ml (ref 9–269)

## 2015-09-08 LAB — IRON AND TIBC
%SAT: 32 % (ref 21–57)
Iron: 87 ug/dL (ref 41–142)
TIBC: 273 ug/dL (ref 236–444)
UIBC: 185 ug/dL (ref 120–384)

## 2015-09-08 NOTE — Telephone Encounter (Signed)
per pof to sch pt appt-gave pt copy of avs °

## 2015-09-08 NOTE — Progress Notes (Signed)
[facility] OFFICE PROGRESS NOTE  Mackenzie Arnold, FNP [address] [place] [address]  DIAGNOSIS: Iron deficiency anemia  Vitamin A deficiency  Vitamin D deficiency  Copper deficiency - Plan: Copper, serum  Zinc deficiency - Plan: Zinc  Chief Complaint  Patient presents with  . Follow-up  . Anemia   OTHER MEDICAL ISSUES: 1. History of pseudotumor cerebri which developed in 1994 attributed to a viral illness, at which time she also had Guillain-Barre.This resulted in blindness and also required a ventriculoperitoneal shunt which was removed. 2. History of morbid obesity and had attempted to have bariatric surgery in [place] by Dr. Lorin Picket. Medical records indicated she had a duodenal switch procedure done in 2007.  CURRENT THERAPY:  Feraheme , as needed, MVI and copper IV replacement started in Aug 2016   INTERVAL HISTORY: Mackenzie [name] 40 y.o. female with a history of blindness, obesity s/p gastric bypass, who presents along with her mother for follow-up for her multiple nutritional deficiency.   She is doing well overall. She had IUD placed by her gynecologist in generally 2017, and has not had menstrual periods since then. She has been feeling well overall, up to a few weeks ago she has developed moderate fatigue again. She denies any shortness of breath, chest pain, or other new discomfort. No other signs of bleeding. She is taking vitamin A, D and iron pill.   MEDICAL HISTORY: Past Medical History  Diagnosis Date  . Breast lump left  . Blind 1994  . Blood transfusion 04/2010    cancer center at [facility] - iron transfusion per pt  . Pseudotumor cerebri 1994    History - caused blind  . Neuromuscular disorder ([place])     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  . Anemia     history  . H/O hiatal hernia     repaired with weight loss surgery  . Depression     no meds  . History of uterine fibroid     ALLERGIES:  is allergic to  influenza vaccines.  MEDICATIONS: has a current medication list which includes the following prescription(s): calcium carbonate, cholecalciferol, clonazepam, cyanocobalamin, ibuprofen, norethindrone-ethinyl estradiol, omeprazole, potassium chloride sa, prenatal vitamin w/fe, fa, PRESCRIPTION MEDICATION, probiotic product, and vitamin a.  SURGICAL HISTORY:  Past Surgical History  Procedure Laterality Date  . Eye decompression x3  1994    Bilateral   . Lp shunt  1994    removed in 2009  . Rurod switch  2007    weight loss surgery done in [place] (duodenal switch)  . Fetal blood transfusion  jan 29,2012  . Breast surgery  2012    right breast lumpectomy  . Appendectomy      removed with switch surgery  . Cholecystectomy      removed with switch surgery  . Myomectomy      REVIEW OF SYSTEMS:   Constitutional: Denies fevers, chills or abnormal weight loss,  Mild fatigue Eyes: Denies blurriness of vision Ears, nose, mouth, throat, and face: Denies mucositis or sore throat Respiratory: Denies cough, dyspnea or wheezes Cardiovascular: Denies palpitation, chest discomfort or lower extremity swelling Gastrointestinal:  Denies nausea, heartburn or change in bowel habits Skin: Denies abnormal skin rashes Lymphatics: Denies new lymphadenopathy or easy bruising Neurological:Denies numbness, tingling or new weaknesses Behavioral/Psych: Mood is stable, no new changes  All other systems were reviewed with the patient and are negative.  PHYSICAL EXAMINATION: ECOG PERFORMANCE STATUS: 1 Blood pressure 125/88, pulse  106, temperature 98.6 F (37 C), temperature source Oral, resp. rate 18, weight 212 lb 6.4 oz (96.344 kg), SpO2 99 %. GENERAL:alert, no distress and comfortable; blind, alopecia and obese SKIN: skin color, texture, turgor are normal, no rashes or significant lesions EYES: normal, Conjunctiva are pink and non-injected, sclera clear OROPHARYNX:no exudate, no erythema and lips,  buccal mucosa, and tongue normal  NECK: supple, thyroid normal size, non-tender, without nodularity LYMPH:  no palpable lymphadenopathy in the cervical, axillary or supraclavicular LUNGS: clear to auscultation and percussion with normal breathing effort HEART: regular rate & rhythm and no murmurs and no lower extremity edema ABDOMEN:abdomen soft, non-tender and normal bowel sounds Musculoskeletal:no cyanosis of digits and no clubbing  NEURO: alert & oriented x 3 with fluent speech, no focal motor/sensory deficits   LABORATORY DATA: CBC Latest Ref Rng 07/18/2015 06/26/2015 03/27/2015  WBC 3.9 - 10.3 10e3/uL 10.8(H) 8.7 8.5  Hemoglobin 11.6 - 15.9 g/dL 11.1(L) 12.4 11.7  Hematocrit 34.8 - 46.6 % 34.0(L) 37.7 36.4  Platelets 145 - 400 10e3/uL 193 162 187   MCV 102.4 today   CMP Latest Ref Rng 06/26/2015 11/25/2014 09/14/2014  Glucose 70 - 140 mg/dl 80 78 -  BUN 7.0 - 26.0 mg/dL 20.5 16.6 -  Creatinine 0.6 - 1.1 mg/dL 0.8 0.9 -  Sodium 136 - 145 mEq/L 138 138 -  Potassium 3.5 - 5.1 mEq/L 4.1 4.3 -  Chloride 96 - 112 mEq/L - - -  CO2 22 - 29 mEq/L 24 25 -  Calcium 8.4 - 10.4 mg/dL 8.8 8.8 8.8  Total Protein 6.4 - 8.3 g/dL 7.7 7.6 -  Total Bilirubin 0.20 - 1.20 mg/dL 0.97 0.54 -  Alkaline Phos 40 - 150 U/L 45 50 -  AST 5 - 34 U/L 27 26 -  ALT 0 - 55 U/L 19 25 -   Results for Mackenzie, [name] (MRN [id number]) as of 09/08/2015 07:31  Ref. Range 06/26/2015 09:38 07/18/2015 13:57  Iron Latest Ref Range: 41-142 ug/dL 88 61  UIBC Latest Ref Range: 120-384 ug/dL 167 146  TIBC Latest Ref Range: 236-444 ug/dL 255 208 (L)  %SAT Latest Ref Range: 21-57 % 35 29  Ferritin Latest Ref Range: 9-269 ng/ml 266 233     RADIOGRAPHIC STUDIES: No new study   ASSESSMENT: Mackenzie [name] 40 y.o. female with a history of iron deficient anemia and multiple nutritional deficiency due to her prior duodenal switch procedure in 2007.  PLAN:   1. Nutritional anemia. - she was diagnosed with iron  deficient anemia secondary to her duodenal switch procedure, and heavy menstrual period. She responded well to IV iron. - she now has macrocytic anemia,  but the 123456 and folic acid level are normal -her lab work also showed low Cooper and zinc level, which may also contribute to her anemia -Her anemia did not correct completely by IV iron. She likely has as a component of other nutritional deficient anemia, possible secondary to her duodenal switch procedure -Her ferritin and iron studies from 07/18/2015, will recheck again today due to her symptoms  -she received copper and zinc replacement in 11/2014, her anemia did not improve much but she felt much better after infusion -I suggest her continue taking multivitamin with minerals, which contains Copper about 1-3mg  a day, and zinc 100 mg per day -she will also continue oral ferrous sulfate by mouth 2 tablets a day   2.  Other vitamin deficiency -She has received 600K U Vit D and 50K U  VitA at Dr. Lucinda Dell office in 2016, now on high oral dose  -Repeated vitamin A and K level are still slightly below normal range after replacement  -she received multivitamin 19ml (containing vitamin 400 unit, vitamin A 6600 unit) intravenous infusion in 11/2014, and did feel much better after infusion.  -We'll repeat her automated A and D level, if it's low, we'll consider multivitamin infusion again  3. Hypocalcemia - hypocalcemia could be related to her vitamin D deficiency, she'll continue oral calcium and vitamin D. -Her hypokalemia has resolved - follow-up her level   4. Menorrhagia -Resolved after IUD placement in 04/2015   She will continue to follow-up with her primary care physician for  other medical issues.  Plan -lab today, will set up iv feraheme, MVI and copper infusion if levels are low  -Monitor CBC and iron studies every 3 months, monitor copper, zinc and vitamin A/D levels every 6 months. I'll see her back in 6 months.  -I'll call her  next week with her lab results.  All questions were answered. The patient knows to call the clinic with any problems, questions or concerns. We can certainly see the patient much sooner if necessary.  I spent 20 minutes counseling the patient face to face. The total time spent in the appointment was 20 minutes.    Truitt Merle, MD 09/08/2015 10:36 AM

## 2015-09-09 LAB — COPPER, SERUM: Copper: 94 ug/dL (ref 72–166)

## 2015-09-09 LAB — VITAMIN D 25 HYDROXY (VIT D DEFICIENCY, FRACTURES): Vitamin D, 25-Hydroxy: 36.5 ng/mL (ref 30.0–100.0)

## 2015-09-09 LAB — ZINC: Zinc, Plasma or Serum: 55 ug/dL — ABNORMAL LOW (ref 56–134)

## 2015-09-12 LAB — VITAMIN A: Vitamin A: 33 ug/dL (ref 20–65)

## 2015-09-14 ENCOUNTER — Other Ambulatory Visit: Payer: Self-pay | Admitting: Hematology

## 2015-09-14 ENCOUNTER — Telehealth: Payer: Self-pay | Admitting: Hematology

## 2015-09-14 ENCOUNTER — Telehealth: Payer: Self-pay | Admitting: *Deleted

## 2015-09-14 NOTE — Telephone Encounter (Signed)
Received call from pt requesting results of labs done recently & asked for call back to [phone].  Message to Dr Burr Medico.

## 2015-09-14 NOTE — Telephone Encounter (Signed)
I called patient back, left a message , regarding her lab test result  From 09/08/2015. Her iron study, vitamin D, vitamin A, copper levels are within normal limits, zinc level was slightly below normal limits. Anemia stable.   I recommend her to have a multivitamin infusion, no need for Feraheme or copper infusion this time. POF sent to set up infusion in one week.  I discussed with our pharmacist  Melissa regarding the order.She knows to call us if she has questions.  Truitt Merle  09/14/2015

## 2015-09-14 NOTE — Telephone Encounter (Signed)
per pof tos ch pt appt-sent MW email to sch trmt-will call pt after reply °

## 2015-09-15 ENCOUNTER — Other Ambulatory Visit: Payer: Self-pay | Admitting: Pharmacist

## 2015-09-15 ENCOUNTER — Telehealth: Payer: Self-pay | Admitting: *Deleted

## 2015-09-15 DIAGNOSIS — D509 Iron deficiency anemia, unspecified: Secondary | ICD-10-CM

## 2015-09-15 NOTE — Telephone Encounter (Signed)
Per staff message and POF I have scheduled appts. Advised scheduler of appts. JMW  

## 2015-09-19 ENCOUNTER — Telehealth: Payer: Self-pay | Admitting: *Deleted

## 2015-09-19 NOTE — Telephone Encounter (Signed)
Spoke with pt and gave pt appt date and time for Thursday 5/25 for MVI infusion as per Dr. Burr Medico.  Informed pt that lab results done 5/12 will be faxed to Dr. Lorin Picket in [place] as per pt's request.

## 2015-09-21 ENCOUNTER — Ambulatory Visit (HOSPITAL_BASED_OUTPATIENT_CLINIC_OR_DEPARTMENT_OTHER): Payer: Commercial Managed Care - HMO

## 2015-09-21 VITALS — BP 113/66 | HR 71 | Temp 98.6°F | Resp 18

## 2015-09-21 DIAGNOSIS — E441 Mild protein-calorie malnutrition: Secondary | ICD-10-CM | POA: Diagnosis not present

## 2015-09-21 DIAGNOSIS — D509 Iron deficiency anemia, unspecified: Secondary | ICD-10-CM

## 2015-09-21 MED ORDER — SODIUM CHLORIDE 0.9 % IV SOLN: Freq: Once | INTRAVENOUS | Status: DC

## 2015-09-21 MED ORDER — INFUVITE ADULT IV SOLN
Freq: Once | INTRAVENOUS | Status: AC
  Administered 2015-09-21: 14:00:00 via INTRAVENOUS
  Filled 2015-09-21: qty 1000

## 2015-12-08 ENCOUNTER — Other Ambulatory Visit: Payer: Commercial Managed Care - HMO

## 2016-01-05 DIAGNOSIS — N3941 Urge incontinence: Secondary | ICD-10-CM | POA: Diagnosis not present

## 2016-01-05 DIAGNOSIS — N3281 Overactive bladder: Secondary | ICD-10-CM | POA: Diagnosis not present

## 2016-01-05 DIAGNOSIS — Z30431 Encounter for routine checking of intrauterine contraceptive device: Secondary | ICD-10-CM | POA: Diagnosis not present

## 2016-02-01 DIAGNOSIS — Z975 Presence of (intrauterine) contraceptive device: Secondary | ICD-10-CM | POA: Diagnosis not present

## 2016-02-01 DIAGNOSIS — N760 Acute vaginitis: Secondary | ICD-10-CM | POA: Diagnosis not present

## 2016-02-01 DIAGNOSIS — M79605 Pain in left leg: Secondary | ICD-10-CM | POA: Diagnosis not present

## 2016-02-01 DIAGNOSIS — N3946 Mixed incontinence: Secondary | ICD-10-CM | POA: Diagnosis not present

## 2016-02-15 DIAGNOSIS — Z30431 Encounter for routine checking of intrauterine contraceptive device: Secondary | ICD-10-CM | POA: Diagnosis not present

## 2016-02-20 DIAGNOSIS — N83202 Unspecified ovarian cyst, left side: Secondary | ICD-10-CM | POA: Diagnosis not present

## 2016-02-20 DIAGNOSIS — Z30432 Encounter for removal of intrauterine contraceptive device: Secondary | ICD-10-CM | POA: Diagnosis not present

## 2016-02-20 DIAGNOSIS — N3946 Mixed incontinence: Secondary | ICD-10-CM | POA: Diagnosis not present

## 2016-02-20 DIAGNOSIS — N83201 Unspecified ovarian cyst, right side: Secondary | ICD-10-CM | POA: Diagnosis not present

## 2016-02-20 DIAGNOSIS — T8389XD Other specified complication of genitourinary prosthetic devices, implants and grafts, subsequent encounter: Secondary | ICD-10-CM | POA: Diagnosis not present

## 2016-02-20 DIAGNOSIS — N939 Abnormal uterine and vaginal bleeding, unspecified: Secondary | ICD-10-CM | POA: Diagnosis not present

## 2016-03-07 NOTE — Progress Notes (Signed)
[place] OFFICE PROGRESS NOTE  [name], Mackenzie Leaver, MD [address] [place] [address]  DIAGNOSIS: Other iron deficiency anemia  Vitamin A deficiency  Vitamin D deficiency  Chief Complaint  Patient presents with  . Follow-up   OTHER MEDICAL ISSUES: 1. History of pseudotumor cerebri which developed in 1994 attributed to a viral illness, at which time she also had Guillain-Barre.This resulted in blindness and also required a ventriculoperitoneal shunt which was removed. 2. History of morbid obesity and had attempted to have bariatric surgery in [place] by Dr. Lorin Picket. Medical records indicated she had a duodenal switch procedure done in 2007.  CURRENT THERAPY:  Feraheme , as needed, MVI and copper IV replacement started in Aug 2016   INTERVAL HISTORY: Mackenzie [name] 40 y.o. female with a history of blindness, obesity s/p gastric bypass, who presents along with her mother for follow-up for her multiple nutritional deficiency.   Her IUD was removed last month and she has had one heavy period since its removal. She is not interested in having her uterus removed because it is a major surgery. She is agreeable to taking birth control medication to manage her menstrual cycles. She is planning to see a specialist about her bladder having dropped and will further discuss if her uterus will need to be removed. She felt much better after the Vitamin A and D infusion. She has been taking a vitamin regimen specifically for people who have had the same weight loss surgery. She reports left eye twitching.    MEDICAL HISTORY: Past Medical History:  Diagnosis Date  . Anemia    history  . Blind 1994  . Blood transfusion 04/2010   cancer center at [facility] long - iron transfusion per pt  . Breast lump left  . Depression    no meds  . H/O hiatal hernia    repaired with weight loss surgery  . History of uterine fibroid   . Neuromuscular disorder ([place])    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  . Pseudotumor cerebri 1994   History - caused blind    ALLERGIES:  is allergic to influenza vaccines.  MEDICATIONS: has a current medication list which includes the following prescription(s): vitamin c, calcium carbonate, cholecalciferol, cyanocobalamin, ferrous fumarate, ibuprofen, medroxyprogesterone, prenatal vitamin w/fe, fa, probiotic product, vitamin a, and vitamin e.  SURGICAL HISTORY:  Past Surgical History:  Procedure Laterality Date  . APPENDECTOMY     removed with switch surgery  . BREAST SURGERY  2012   right breast lumpectomy  . CHOLECYSTECTOMY     removed with switch surgery  . eye decompression x3  1994   Bilateral   . FETAL BLOOD TRANSFUSION  jan 29,2012  . lp shunt  1994   removed in 2009  . MYOMECTOMY    . rurod switch  2007   weight loss surgery done in [place] (duodenal switch)    REVIEW OF SYSTEMS:   Constitutional: Denies fevers, chills or abnormal weight loss Eyes: Denies blurriness of vision (+) left eye twitch Ears, nose, mouth, throat, and face: Denies mucositis or sore throat Respiratory: Denies cough, dyspnea or wheezes Cardiovascular: Denies palpitation, chest discomfort or lower extremity swelling Gastrointestinal:  Denies nausea, heartburn or change in bowel habits Skin: Denies abnormal skin rashes Lymphatics: Denies new lymphadenopathy or easy bruising Neurological:Denies numbness, tingling or new weaknesses Behavioral/Psych: Mood is stable, no new changes  All other systems were reviewed with the patient and are negative.  PHYSICAL EXAMINATION: ECOG PERFORMANCE STATUS: 1 Blood pressure 139/76, pulse 86, temperature 98.4 F (36.9 C), temperature source Oral, resp. rate 18, height 5\' 6"  (1.676 m), weight 197 lb (89.4 kg), SpO2 100 %. GENERAL:alert, no distress and comfortable; blind SKIN: skin color, texture, turgor are normal, no rashes or significant lesions EYES: normal, Conjunctiva are  pink and non-injected, sclera clear OROPHARYNX:no exudate, no erythema and lips, buccal mucosa, and tongue normal  NECK: supple, thyroid normal size, non-tender, without nodularity LYMPH:  no palpable lymphadenopathy in the cervical, axillary or supraclavicular LUNGS: clear to auscultation and percussion with normal breathing effort HEART: regular rate & rhythm and no murmurs and no lower extremity edema ABDOMEN:abdomen soft, non-tender and normal bowel sounds Musculoskeletal:no cyanosis of digits and no clubbing  NEURO: alert & oriented x 3 with fluent speech, no focal motor/sensory deficits    LABORATORY DATA: CBC Latest Ref Rng & Units 03/08/2016 09/08/2015 07/18/2015  WBC 3.9 - 10.3 10e3/uL 10.4(H) 9.7 10.8(H)  Hemoglobin 11.6 - 15.9 g/dL 11.2(L) 11.5(L) 11.1(L)  Hematocrit 34.8 - 46.6 % 35.2 36.3 34.0(L)  Platelets 145 - 400 10e3/uL 168 189 193   MCV 102.4 today   CMP Latest Ref Rng & Units 03/08/2016 09/08/2015 06/26/2015  Glucose 70 - 140 mg/dl 78 75 80  BUN 7.0 - 26.0 mg/dL 15.5 15.5 20.5  Creatinine 0.6 - 1.1 mg/dL 0.7 0.7 0.8  Sodium 136 - 145 mEq/L 137 140 138  Potassium 3.5 - 5.1 mEq/L 4.0 3.9 4.1  Chloride 96 - 112 mEq/L - - -  CO2 22 - 29 mEq/L 24 25 24   Calcium 8.4 - 10.4 mg/dL 9.0 9.1 8.8  Total Protein 6.4 - 8.3 g/dL 7.6 7.4 7.7  Total Bilirubin 0.20 - 1.20 mg/dL 1.05 0.63 0.97  Alkaline Phos 40 - 150 U/L 68 51 45  AST 5 - 34 U/L 28 19 27   ALT 0 - 55 U/L 25 17 19   Results for Mackenzie, [name] (MRN [id number]) as of 03/08/2016 22:14  Ref. Range 07/18/2015 13:57 09/08/2015 10:50 03/08/2016 10:13  Iron Latest Ref Range: 41 - 142 ug/dL 61 87 79  UIBC Latest Ref Range: 120 - 384 ug/dL 146 185 211  TIBC Latest Ref Range: 236 - 444 ug/dL 208 (L) 273 290  %SAT Latest Ref Range: 21 - 57 % 29 32 27  Ferritin Latest Ref Range: 9 - 269 ng/ml 233 232 196    RADIOGRAPHIC STUDIES: No new study   ASSESSMENT: Mackenzie [name] 40 y.o. female with a history of iron deficient  anemia and multiple nutritional deficiency due to her prior duodenal switch procedure in 2007.  PLAN:   1. Nutritional anemia. - she was diagnosed with iron deficient anemia secondary to her duodenal switch procedure, and heavy menstrual period. She responded well to IV iron. - she now has macrocytic anemia,  but the [id number] and folic acid level are normal -her lab work also showed low Cooper and zinc level, which may also contribute to her anemia -Her anemia did not correct completely by IV iron. She likely has as a component of other nutritional deficient anemia, possible secondary to her duodenal switch procedure -her iron study today was normal  -she received copper and zinc replacement in 11/2014, her anemia did not improve much but she felt much better after infusion -I suggest her continue taking multivitamin with minerals, which contains Copper about 1-3mg  a day, and zinc 100 mg per day -she will also continue oral ferrous sulfate by mouth  2 tablets a day   2.  Other vitamin deficiency -She has received 600K U Vit D and 50K U VitA at Dr. Lucinda Dell office in 2016, now on high oral dose  -she received multivitamin 61ml (containing vitamin 400 unit, vitamin A 6600 unit) intravenous infusion in 11/2014, and did feel much better after infusion.  -We'll repeat her automated A and D level, if it's low, we'll consider multivitamin infusion again   3. Hypocalcemia - hypocalcemia could be related to her vitamin D deficiency, she'll continue oral calcium and vitamin D. -Her hypokalemia has resolved  - follow-up her level   4. Menorrhagia -The IUD was taken out last month, she has since began having heavy periods again   She will continue to follow-up with her primary care physician for  other medical issues.  Plan -lab reviewed today -Monitor CBC and iron studies every 3 months, monitor copper, zinc and vitamin A/D levels every 6 months. I'll see her back in 6 months. She will have labs the  week prior to our visit -I'll call her next week with her lab results (copper, zinc and vit A, D levels)   -No need iv iron this time   All questions were answered. The patient knows to call the clinic with any problems, questions or concerns. We can certainly see the patient much sooner if necessary.  I spent 20 minutes counseling the patient face to face. The total time spent in the appointment was 20 minutes.  This document serves as a record of services personally performed by Truitt Merle, MD. It was created on her behalf by Arlyce Harman, a trained medical scribe. The creation of this record is based on the scribe's personal observations and the provider's statements to them. This document has been checked and approved by the attending provider.    Truitt Merle, MD 03/08/16

## 2016-03-08 ENCOUNTER — Ambulatory Visit (HOSPITAL_BASED_OUTPATIENT_CLINIC_OR_DEPARTMENT_OTHER): Payer: Commercial Managed Care - HMO | Admitting: Hematology

## 2016-03-08 ENCOUNTER — Encounter: Payer: Self-pay | Admitting: Hematology

## 2016-03-08 ENCOUNTER — Other Ambulatory Visit (HOSPITAL_BASED_OUTPATIENT_CLINIC_OR_DEPARTMENT_OTHER): Payer: Commercial Managed Care - HMO

## 2016-03-08 VITALS — BP 139/76 | HR 86 | Temp 98.4°F | Resp 18 | Ht 66.0 in | Wt 197.0 lb

## 2016-03-08 DIAGNOSIS — D509 Iron deficiency anemia, unspecified: Secondary | ICD-10-CM | POA: Diagnosis not present

## 2016-03-08 DIAGNOSIS — E610 Copper deficiency: Secondary | ICD-10-CM

## 2016-03-08 DIAGNOSIS — E559 Vitamin D deficiency, unspecified: Secondary | ICD-10-CM

## 2016-03-08 DIAGNOSIS — E509 Vitamin A deficiency, unspecified: Secondary | ICD-10-CM

## 2016-03-08 DIAGNOSIS — E60 Dietary zinc deficiency: Secondary | ICD-10-CM

## 2016-03-08 DIAGNOSIS — D508 Other iron deficiency anemias: Secondary | ICD-10-CM

## 2016-03-08 LAB — CBC & DIFF AND RETIC
BASO%: 0.2 % (ref 0.0–2.0)
Basophils Absolute: 0 10*3/uL (ref 0.0–0.1)
EOS%: 0.3 % (ref 0.0–7.0)
Eosinophils Absolute: 0 10*3/uL (ref 0.0–0.5)
HCT: 35.2 % (ref 34.8–46.6)
HGB: 11.2 g/dL — ABNORMAL LOW (ref 11.6–15.9)
Immature Retic Fract: 3.8 % (ref 1.60–10.00)
LYMPH%: 14.7 % (ref 14.0–49.7)
MCH: 32.5 pg (ref 25.1–34.0)
MCHC: 31.8 g/dL (ref 31.5–36.0)
MCV: 102 fL — ABNORMAL HIGH (ref 79.5–101.0)
MONO#: 0.8 10*3/uL (ref 0.1–0.9)
MONO%: 7.4 % (ref 0.0–14.0)
NEUT#: 8.1 10*3/uL — ABNORMAL HIGH (ref 1.5–6.5)
NEUT%: 77.4 % — ABNORMAL HIGH (ref 38.4–76.8)
Platelets: 168 10*3/uL (ref 145–400)
RBC: 3.45 10*6/uL — ABNORMAL LOW (ref 3.70–5.45)
RDW: 13.5 % (ref 11.2–14.5)
Retic %: 4.3 % — ABNORMAL HIGH (ref 0.70–2.10)
Retic Ct Abs: 148.35 10*3/uL — ABNORMAL HIGH (ref 33.70–90.70)
WBC: 10.4 10*3/uL — ABNORMAL HIGH (ref 3.9–10.3)
lymph#: 1.5 10*3/uL (ref 0.9–3.3)

## 2016-03-08 LAB — COMPREHENSIVE METABOLIC PANEL
ALT: 25 U/L (ref 0–55)
AST: 28 U/L (ref 5–34)
Albumin: 3.9 g/dL (ref 3.5–5.0)
Alkaline Phosphatase: 68 U/L (ref 40–150)
Anion Gap: 8 mEq/L (ref 3–11)
BUN: 15.5 mg/dL (ref 7.0–26.0)
CO2: 24 mEq/L (ref 22–29)
Calcium: 9 mg/dL (ref 8.4–10.4)
Chloride: 105 mEq/L (ref 98–109)
Creatinine: 0.7 mg/dL (ref 0.6–1.1)
EGFR: >90 mL/min/{1.73_m2} (ref >90)
Glucose: 78 mg/dl (ref 70–140)
Potassium: 4 mEq/L (ref 3.5–5.1)
Sodium: 137 mEq/L (ref 136–145)
Total Bilirubin: 1.05 mg/dL (ref 0.20–1.20)
Total Protein: 7.6 g/dL (ref 6.4–8.3)

## 2016-03-08 LAB — IRON AND TIBC
%SAT: 27 % (ref 21–57)
Iron: 79 ug/dL (ref 41–142)
TIBC: 290 ug/dL (ref 236–444)
UIBC: 211 ug/dL (ref 120–384)

## 2016-03-08 LAB — FERRITIN: Ferritin: 196 ng/ml (ref 9–269)

## 2016-03-09 LAB — VITAMIN D 25 HYDROXY (VIT D DEFICIENCY, FRACTURES): Vitamin D, 25-Hydroxy: 22.4 ng/mL — ABNORMAL LOW (ref 30.0–100.0)

## 2016-03-11 LAB — VITAMIN A: Vitamin A: 39 ug/dL (ref 20–65)

## 2016-03-12 LAB — COPPER, SERUM: Copper: 77 ug/dL (ref 72–166)

## 2016-03-12 LAB — ZINC: Zinc, Plasma or Serum: 50 ug/dL — ABNORMAL LOW (ref 56–134)

## 2016-03-18 ENCOUNTER — Telehealth: Payer: Self-pay | Admitting: Hematology

## 2016-03-18 ENCOUNTER — Telehealth: Payer: Self-pay | Admitting: General Practice

## 2016-03-18 NOTE — Telephone Encounter (Signed)
I called patient back, left a message, discussed her lab results. Her copper and vitamin K, and iron study was normal. Her zinc level and a vitamin D level will slightly low. I offered multivitamin infusion next week, I encouraged her to take over-the-counter zinc replacement. She knows to call us if she has questions. I sent a schedule message.  Truitt Merle  03/18/2016

## 2016-03-18 NOTE — Telephone Encounter (Signed)
Spoke with pt confirmed Feb 2018 appt.

## 2016-03-20 ENCOUNTER — Telehealth: Payer: Self-pay | Admitting: Hematology

## 2016-03-20 NOTE — Telephone Encounter (Signed)
lvm to inform pt of next infusion appt 12/5 per LOS

## 2016-04-02 ENCOUNTER — Ambulatory Visit: Payer: Commercial Managed Care - HMO

## 2016-04-09 ENCOUNTER — Telehealth: Payer: Self-pay | Admitting: *Deleted

## 2016-04-09 NOTE — Telephone Encounter (Signed)
Spoke with pt and was informed that pt had called to reschedule multivitamin infusion from 12/5 to another date.  Stated she had left 3 messages, and no one had returned pt's call.  Transferred call to scheduling [phone]. Pt also would like to ask Dr. Burr Medico to add Vit A and D to the infusion.

## 2016-04-09 NOTE — Telephone Encounter (Signed)
Yes, her vitamin cocktail contains Vit A and D. I informed pharmacy for her upcoming infusion.   Truitt Merle MD

## 2016-04-10 ENCOUNTER — Telehealth: Payer: Self-pay | Admitting: Hematology

## 2016-04-10 NOTE — Telephone Encounter (Signed)
sw pt to confirm r/s appt to 12/18 per LOS

## 2016-04-11 DIAGNOSIS — Z131 Encounter for screening for diabetes mellitus: Secondary | ICD-10-CM | POA: Diagnosis not present

## 2016-04-11 DIAGNOSIS — R399 Unspecified symptoms and signs involving the genitourinary system: Secondary | ICD-10-CM | POA: Diagnosis not present

## 2016-04-11 DIAGNOSIS — E559 Vitamin D deficiency, unspecified: Secondary | ICD-10-CM | POA: Diagnosis not present

## 2016-04-11 DIAGNOSIS — F5101 Primary insomnia: Secondary | ICD-10-CM | POA: Diagnosis not present

## 2016-04-11 DIAGNOSIS — F330 Major depressive disorder, recurrent, mild: Secondary | ICD-10-CM | POA: Diagnosis not present

## 2016-04-11 DIAGNOSIS — E669 Obesity, unspecified: Secondary | ICD-10-CM | POA: Diagnosis not present

## 2016-04-11 DIAGNOSIS — Z6831 Body mass index (BMI) 31.0-31.9, adult: Secondary | ICD-10-CM | POA: Diagnosis not present

## 2016-04-11 DIAGNOSIS — Z0000 Encounter for general adult medical examination without abnormal findings: Secondary | ICD-10-CM | POA: Diagnosis not present

## 2016-04-15 ENCOUNTER — Ambulatory Visit (HOSPITAL_BASED_OUTPATIENT_CLINIC_OR_DEPARTMENT_OTHER): Payer: Commercial Managed Care - HMO

## 2016-04-15 VITALS — BP 128/69 | HR 74 | Temp 99.0°F | Resp 17

## 2016-04-15 DIAGNOSIS — E509 Vitamin A deficiency, unspecified: Secondary | ICD-10-CM | POA: Diagnosis not present

## 2016-04-15 DIAGNOSIS — D509 Iron deficiency anemia, unspecified: Secondary | ICD-10-CM

## 2016-04-15 DIAGNOSIS — E559 Vitamin D deficiency, unspecified: Secondary | ICD-10-CM

## 2016-04-15 MED ORDER — SODIUM CHLORIDE 0.9 % IV SOLN: Freq: Once | INTRAVENOUS | Status: DC

## 2016-04-15 MED ORDER — SODIUM CHLORIDE 0.9 % IV SOLN
Freq: Once | INTRAVENOUS | Status: AC
  Administered 2016-04-15: 11:00:00 via INTRAVENOUS
  Filled 2016-04-15: qty 1000

## 2016-04-15 NOTE — Patient Instructions (Signed)
Anemia, Nonspecific Anemia is a condition in which the concentration of red blood cells or hemoglobin in the blood is below normal. Hemoglobin is a substance in red blood cells that carries oxygen to the tissues of the body. Anemia results in not enough oxygen reaching these tissues. What are the causes? Common causes of anemia include:  Excessive bleeding. Bleeding may be internal or external. This includes excessive bleeding from periods (in women) or from the intestine.  Poor nutrition.  Chronic kidney, thyroid, and liver disease.  Bone marrow disorders that decrease red blood cell production.  Cancer and treatments for cancer.  HIV, AIDS, and their treatments.  Spleen problems that increase red blood cell destruction.  Blood disorders.  Excess destruction of red blood cells due to infection, medicines, and autoimmune disorders. What are the signs or symptoms?  Minor weakness.  Dizziness.  Headache.  Palpitations.  Shortness of breath, especially with exercise.  Paleness.  Cold sensitivity.  Indigestion.  Nausea.  Difficulty sleeping.  Difficulty concentrating. Symptoms may occur suddenly or they may develop slowly. How is this diagnosed? Additional blood tests are often needed. These help your health care provider determine the best treatment. Your health care provider will check your stool for blood and look for other causes of blood loss. How is this treated? Treatment varies depending on the cause of the anemia. Treatment can include:  Supplements of iron, vitamin B12, or folic acid.  Hormone medicines.  A blood transfusion. This may be needed if blood loss is severe.  Hospitalization. This may be needed if there is significant continual blood loss.  Dietary changes.  Spleen removal. Follow these instructions at home: Keep all follow-up appointments. It often takes many weeks to correct anemia, and having your health care provider check on your  condition and your response to treatment is very important. Get help right away if:  You develop extreme weakness, shortness of breath, or chest pain.  You become dizzy or have trouble concentrating.  You develop heavy vaginal bleeding.  You develop a rash.  You have bloody or black, tarry stools.  You faint.  You vomit up blood.  You vomit repeatedly.  You have abdominal pain.  You have a fever or persistent symptoms for more than 2-3 days.  You have a fever and your symptoms suddenly get worse.  You are dehydrated. This information is not intended to replace advice given to you by your health care provider. Make sure you discuss any questions you have with your health care provider. Document Released: 05/23/2004 Document Revised: 09/27/2015 Document Reviewed: 10/09/2012 Elsevier Interactive Patient Education  2017 Elsevier Inc.  

## 2016-05-08 DIAGNOSIS — N938 Other specified abnormal uterine and vaginal bleeding: Secondary | ICD-10-CM | POA: Diagnosis not present

## 2016-05-08 DIAGNOSIS — N3281 Overactive bladder: Secondary | ICD-10-CM | POA: Diagnosis not present

## 2016-05-08 DIAGNOSIS — N3946 Mixed incontinence: Secondary | ICD-10-CM | POA: Diagnosis not present

## 2016-05-08 DIAGNOSIS — N814 Uterovaginal prolapse, unspecified: Secondary | ICD-10-CM | POA: Diagnosis not present

## 2016-05-08 DIAGNOSIS — N8112 Cystocele, lateral: Secondary | ICD-10-CM | POA: Diagnosis not present

## 2016-05-09 ENCOUNTER — Other Ambulatory Visit (HOSPITAL_COMMUNITY)
Admission: RE | Admit: 2016-05-09 | Discharge: 2016-05-09 | Disposition: A | Discharge status: Home / Self Care | Payer: Commercial Managed Care - HMO | Source: Ambulatory Visit | Attending: Obstetrics & Gynecology | Admitting: Obstetrics & Gynecology

## 2016-05-09 ENCOUNTER — Other Ambulatory Visit: Payer: Self-pay | Admitting: Obstetrics & Gynecology

## 2016-05-09 DIAGNOSIS — N83202 Unspecified ovarian cyst, left side: Secondary | ICD-10-CM | POA: Diagnosis not present

## 2016-05-09 DIAGNOSIS — Z01419 Encounter for gynecological examination (general) (routine) without abnormal findings: Secondary | ICD-10-CM | POA: Diagnosis not present

## 2016-05-09 DIAGNOSIS — Z1151 Encounter for screening for human papillomavirus (HPV): Secondary | ICD-10-CM | POA: Diagnosis not present

## 2016-05-09 DIAGNOSIS — N898 Other specified noninflammatory disorders of vagina: Secondary | ICD-10-CM | POA: Diagnosis not present

## 2016-05-09 DIAGNOSIS — N3941 Urge incontinence: Secondary | ICD-10-CM | POA: Diagnosis not present

## 2016-05-09 DIAGNOSIS — Z01411 Encounter for gynecological examination (general) (routine) with abnormal findings: Secondary | ICD-10-CM | POA: Diagnosis not present

## 2016-05-09 DIAGNOSIS — N83201 Unspecified ovarian cyst, right side: Secondary | ICD-10-CM | POA: Diagnosis not present

## 2016-05-09 DIAGNOSIS — N921 Excessive and frequent menstruation with irregular cycle: Secondary | ICD-10-CM | POA: Diagnosis not present

## 2016-05-13 LAB — CYTOLOGY - PAP
Diagnosis: NEGATIVE
HPV: NOT DETECTED

## 2016-05-24 DIAGNOSIS — B9689 Other specified bacterial agents as the cause of diseases classified elsewhere: Secondary | ICD-10-CM | POA: Diagnosis not present

## 2016-05-24 DIAGNOSIS — J329 Chronic sinusitis, unspecified: Secondary | ICD-10-CM | POA: Diagnosis not present

## 2016-06-07 ENCOUNTER — Other Ambulatory Visit: Payer: Commercial Managed Care - HMO

## 2016-06-12 ENCOUNTER — Other Ambulatory Visit: Payer: Commercial Managed Care - HMO

## 2016-07-03 ENCOUNTER — Telehealth: Payer: Self-pay | Admitting: Hematology

## 2016-07-03 NOTE — Telephone Encounter (Signed)
Pt called to r/s appt due not feeling well. Gave pt next available appt per request

## 2016-07-08 NOTE — Progress Notes (Signed)
Mackenzie [name]  [name], Mackenzie Leaver, MD [place] [address] [place] [address]  DIAGNOSIS: Vitamin D deficiency - Plan: Vitamin D 25 hydroxy  Vitamin A deficiency - Plan: Vitamin A  Iron deficiency anemia, unspecified iron deficiency anemia type  Anemia, unspecified type - Plan: Zinc, Vitamin C, Copper, serum  No chief complaint on file.  OTHER MEDICAL ISSUES: 1. History of pseudotumor cerebri which developed in 1994 attributed to a viral illness, at which time she also had Guillain-Barre.This resulted in blindness and also required a ventriculoperitoneal shunt which was removed. 2. History of morbid obesity and had attempted to have bariatric surgery in [place] by Dr. Lorin Picket. Medical records indicated she had a duodenal switch procedure done in 2007.  CURRENT THERAPY:  Feraheme , as needed, MVI and copper IV replacement as needed started in Aug 2016   INTERVAL HISTORY: Mackenzie [name] 41 y.o. female with a history of blindness, obesity s/p gastric bypass, who presents along with her mother for follow-up for her multiple nutritional deficiency. She has been doing well other than some fatigue and leg muscle cramps in the last 3 weeks. She is on birth control pills, so her period hasn't been as heavy, but it is still consistent once a month for about 3 days. She takes B12 and multiple other vitamins daily. Denies any other concerns.   MEDICAL HISTORY: Past Medical History:  Diagnosis Date  . Anemia    history  . Blind 1994  . Blood transfusion 04/2010   cancer center at [facility] long - iron transfusion per pt  . Breast lump left  . Depression    no meds  . H/O hiatal hernia    repaired with weight loss surgery  . History of uterine fibroid   . Neuromuscular disorder ([place])    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  . Pseudotumor cerebri 1994   History - caused blind    ALLERGIES:  is allergic to influenza  vaccines.  MEDICATIONS: has a current medication list which includes the following prescription(s): vitamin c, calcium carbonate, cholecalciferol, cyanocobalamin, ferrous fumarate, ibuprofen, medroxyprogesterone, prenatal vitamin w/fe, fa, probiotic product, vitamin a, and vitamin e.  SURGICAL HISTORY:  Past Surgical History:  Procedure Laterality Date  . APPENDECTOMY     removed with switch surgery  . BREAST SURGERY  2012   right breast lumpectomy  . CHOLECYSTECTOMY     removed with switch surgery  . eye decompression x3  1994   Bilateral   . FETAL BLOOD TRANSFUSION  jan 29,2012  . lp shunt  1994   removed in 2009  . MYOMECTOMY    . rurod switch  2007   weight loss surgery done in [place] (duodenal switch)    REVIEW OF SYSTEMS:   Constitutional: Denies fevers, chills or abnormal weight loss (+) fatigue Eyes: Denies blurriness of vision  Ears, nose, mouth, throat, and face: Denies mucositis or sore throat Respiratory: Denies cough, dyspnea or wheezes Cardiovascular: Denies palpitation, chest discomfort or lower extremity swelling Gastrointestinal:  Denies nausea, heartburn or change in bowel habits Skin: Denies abnormal skin rashes Lymphatics: Denies new lymphadenopathy or easy bruising Neurological:Denies numbness, tingling or new weaknesses Behavioral/Psych: Mood is stable, no new changes  Musculoskeletal: (+) leg muscle cramps All other systems were reviewed with the patient and are negative.  PHYSICAL EXAMINATION: ECOG PERFORMANCE STATUS: 1  Blood pressure 125/76, pulse 75, temperature 98.3 F (36.8 C), temperature source Oral, resp. rate 18,  height 5\' 6"  (1.676 m), weight 194 lb 3.2 oz (88.1 kg), SpO2 100 %.   GENERAL:alert, no distress and comfortable; blind SKIN: skin color, texture, turgor are normal, no rashes or significant lesions EYES: normal, Conjunctiva are pink and non-injected, sclera clear OROPHARYNX:no exudate, no erythema and lips, buccal mucosa,  and tongue normal  NECK: supple, thyroid normal size, non-tender, without nodularity LYMPH:  no palpable lymphadenopathy in the cervical, axillary or supraclavicular LUNGS: clear to auscultation and percussion with normal breathing effort HEART: regular rate & rhythm and no murmurs and no lower extremity edema ABDOMEN:abdomen soft, non-tender and normal bowel sounds Musculoskeletal:no cyanosis of digits and no clubbing  NEURO: alert & oriented x 3 with fluent speech, no focal motor/sensory deficits   LABORATORY DATA: CBC Latest Ref Rng & Units 07/12/2016 03/08/2016 09/08/2015  WBC 3.9 - 10.3 10e3/uL 10.2 10.4(H) 9.7  Hemoglobin 11.6 - 15.9 g/dL 11.6 11.2(L) 11.5(L)  Hematocrit 34.8 - 46.6 % 35.8 35.2 36.3  Platelets 145 - 400 10e3/uL 209 168 189   CMP Latest Ref Rng & Units 03/08/2016 09/08/2015 06/26/2015  Glucose 70 - 140 mg/dl 78 75 80  BUN 7.0 - 26.0 mg/dL 15.5 15.5 20.5  Creatinine 0.6 - 1.1 mg/dL 0.7 0.7 0.8  Sodium 136 - 145 mEq/L 137 140 138  Potassium 3.5 - 5.1 mEq/L 4.0 3.9 4.1  Chloride 96 - 112 mEq/L - - -  CO2 22 - 29 mEq/L 24 25 24   Calcium 8.4 - 10.4 mg/dL 9.0 9.1 8.8  Total Protein 6.4 - 8.3 g/dL 7.6 7.4 7.7  Total Bilirubin 0.20 - 1.20 mg/dL 1.05 0.63 0.97  Alkaline Phos 40 - 150 U/L 68 51 45  AST 5 - 34 U/L 28 19 27   ALT 0 - 55 U/L 25 17 19    RADIOGRAPHIC STUDIES: No new study   ASSESSMENT: Mackenzie [name] 41 y.o. female with a history of iron deficient anemia and multiple nutritional deficiency due to her prior duodenal switch procedure in 2007.  1. Nutritional anemia. - she was diagnosed with iron deficient anemia secondary to her duodenal switch procedure, and heavy menstrual period. She responded well to IV iron. - she now has macrocytic anemia,  but the A45 and folic acid level are normal -her lab work also showed low Cooper and zinc level, which may also contribute to her anemia -Her anemia did not correct completely by IV iron. She likely has as a  component of other nutritional deficient anemia, possible secondary to her duodenal switch procedure -her iron study today was normal  -she received copper and zinc replacement in 11/2014, her anemia did not improve much but she felt much better after infusion -I suggest her continue taking multivitamin with minerals, which contains Copper about 1-3mg  a day, and zinc 100 mg per day -she will also continue oral ferrous sulfate by mouth 2 tablets a day -Labs reviewed Hgb 11.6 today.    2.  Other vitamin deficiency -She has received 600K U Vit D and 50K U VitA at Dr. Lucinda Dell office in 2016, now on high oral dose  -she received multivitamin 44ml (containing vitamin 400 unit, vitamin A 6600 unit) intravenous infusion in 11/2014, and did feel much better after infusion.  -We'll repeat her automated A and D level, if it's low, we'll consider multivitamin infusion again  -She would like this checked every 3 months rather than every 6 months.  -Lab today. Depending on these results, I will set up an infusion.   3.  Hypocalcemia - hypocalcemia could be related to her vitamin D deficiency, she'll continue oral calcium and vitamin D. - follow-up her level   4. Menorrhagia -The IUD was taken out last month, she has since began having heavy periods again -she is on OCT now, her period has been lighter now   She will continue to follow-up with her primary care physician for  other medical issues.  Plan -lab reviewed today.  -Lab today to check vitamins and copper levels  -MVI and copper Infusion next week if level not high. We will likely continue this infusion every 6 months  -Monitor CBC, iron studies, copper, zinc and vitamin A/C/D levels every 3 months. -I'll see her back in 6 months with lab    All questions were answered. The patient knows to call the clinic with any problems, questions or concerns. We can certainly see the patient much sooner if necessary.  I spent 20 minutes counseling  the patient face to face. The total time spent in the appointment was 20 minutes.  This document serves as a record of services personally performed by Truitt Merle, MD. It was created on her behalf by Martinique Casey, a trained medical scribe. The creation of this record is based on the scribe's personal observations and the provider's statements to them. This document has been checked and approved by the attending provider.  I have reviewed the above documentation for accuracy and completeness and I agree with the above.  Truitt Merle, MD 07/12/2016

## 2016-07-12 ENCOUNTER — Ambulatory Visit: Payer: Medicare HMO

## 2016-07-12 ENCOUNTER — Telehealth: Payer: Self-pay | Admitting: Hematology

## 2016-07-12 ENCOUNTER — Encounter: Payer: Self-pay | Admitting: Hematology

## 2016-07-12 ENCOUNTER — Ambulatory Visit (HOSPITAL_BASED_OUTPATIENT_CLINIC_OR_DEPARTMENT_OTHER): Payer: Medicare HMO | Admitting: Hematology

## 2016-07-12 ENCOUNTER — Other Ambulatory Visit (HOSPITAL_BASED_OUTPATIENT_CLINIC_OR_DEPARTMENT_OTHER): Payer: Medicare HMO

## 2016-07-12 ENCOUNTER — Other Ambulatory Visit: Payer: Self-pay | Admitting: *Deleted

## 2016-07-12 VITALS — BP 125/76 | HR 75 | Temp 98.3°F | Resp 18 | Ht 66.0 in | Wt 194.2 lb

## 2016-07-12 DIAGNOSIS — D649 Anemia, unspecified: Secondary | ICD-10-CM

## 2016-07-12 DIAGNOSIS — E509 Vitamin A deficiency, unspecified: Secondary | ICD-10-CM

## 2016-07-12 DIAGNOSIS — E559 Vitamin D deficiency, unspecified: Secondary | ICD-10-CM

## 2016-07-12 DIAGNOSIS — D509 Iron deficiency anemia, unspecified: Secondary | ICD-10-CM | POA: Diagnosis not present

## 2016-07-12 LAB — CBC & DIFF AND RETIC
BASO%: 0.1 % (ref 0.0–2.0)
Basophils Absolute: 0 10*3/uL (ref 0.0–0.1)
EOS%: 0.4 % (ref 0.0–7.0)
Eosinophils Absolute: 0 10*3/uL (ref 0.0–0.5)
HCT: 35.8 % (ref 34.8–46.6)
HGB: 11.6 g/dL (ref 11.6–15.9)
Immature Retic Fract: 3.9 % (ref 1.60–10.00)
LYMPH%: 17.2 % (ref 14.0–49.7)
MCH: 32.5 pg (ref 25.1–34.0)
MCHC: 32.4 g/dL (ref 31.5–36.0)
MCV: 100.3 fL (ref 79.5–101.0)
MONO#: 0.6 10*3/uL (ref 0.1–0.9)
MONO%: 6.1 % (ref 0.0–14.0)
NEUT#: 7.8 10*3/uL — ABNORMAL HIGH (ref 1.5–6.5)
NEUT%: 76.2 % (ref 38.4–76.8)
Platelets: 209 10*3/uL (ref 145–400)
RBC: 3.57 10*6/uL — ABNORMAL LOW (ref 3.70–5.45)
RDW: 13.9 % (ref 11.2–14.5)
Retic %: 3 % — ABNORMAL HIGH (ref 0.70–2.10)
Retic Ct Abs: 107.1 10*3/uL — ABNORMAL HIGH (ref 33.70–90.70)
WBC: 10.2 10*3/uL (ref 3.9–10.3)
lymph#: 1.8 10*3/uL (ref 0.9–3.3)

## 2016-07-12 LAB — FERRITIN: Ferritin: 78 ng/ml (ref 9–269)

## 2016-07-12 LAB — IRON AND TIBC
%SAT: 33 % (ref 21–57)
Iron: 95 ug/dL (ref 41–142)
TIBC: 285 ug/dL (ref 236–444)
UIBC: 190 ug/dL (ref 120–384)

## 2016-07-12 NOTE — Telephone Encounter (Signed)
Appointments scheduled per 3.16.18 LOS. Patient given AVS report and calendars with future scheduled appointments.  °

## 2016-07-12 NOTE — Telephone Encounter (Signed)
Called patient to inform her of next scheduled multivitamin infusion appointment for 3.23.18. LVM

## 2016-07-13 LAB — VITAMIN D 25 HYDROXY (VIT D DEFICIENCY, FRACTURES): Vitamin D, 25-Hydroxy: 17.6 ng/mL — ABNORMAL LOW (ref 30.0–100.0)

## 2016-07-16 LAB — COPPER, SERUM: Copper: 58 ug/dL — ABNORMAL LOW (ref 72–166)

## 2016-07-16 LAB — ZINC: Zinc, Plasma or Serum: 41 ug/dL — ABNORMAL LOW (ref 56–134)

## 2016-07-16 LAB — VITAMIN C: Vitamin C: 0.6 mg/dL (ref 0.2–2.0)

## 2016-07-17 DIAGNOSIS — N3281 Overactive bladder: Secondary | ICD-10-CM | POA: Insufficient documentation

## 2016-07-17 DIAGNOSIS — R32 Unspecified urinary incontinence: Secondary | ICD-10-CM | POA: Diagnosis not present

## 2016-07-17 DIAGNOSIS — N938 Other specified abnormal uterine and vaginal bleeding: Secondary | ICD-10-CM | POA: Diagnosis not present

## 2016-07-18 LAB — VITAMIN A: Vitamin A: 35 ug/dL (ref 20–65)

## 2016-07-19 ENCOUNTER — Other Ambulatory Visit: Payer: Self-pay | Admitting: Hematology

## 2016-07-19 ENCOUNTER — Ambulatory Visit (HOSPITAL_BASED_OUTPATIENT_CLINIC_OR_DEPARTMENT_OTHER): Payer: Medicare HMO

## 2016-07-19 DIAGNOSIS — E559 Vitamin D deficiency, unspecified: Secondary | ICD-10-CM

## 2016-07-19 DIAGNOSIS — E610 Copper deficiency: Secondary | ICD-10-CM | POA: Diagnosis not present

## 2016-07-19 DIAGNOSIS — D509 Iron deficiency anemia, unspecified: Secondary | ICD-10-CM

## 2016-07-19 DIAGNOSIS — E509 Vitamin A deficiency, unspecified: Secondary | ICD-10-CM | POA: Diagnosis not present

## 2016-07-19 MED ORDER — ACETAMINOPHEN 325 MG PO TABS: 650.0000 mg | ORAL_TABLET | Freq: Once | ORAL | Status: DC

## 2016-07-19 MED ORDER — FERUMOXYTOL INJECTION 510 MG/17 ML
510.0000 mg | Freq: Once | INTRAVENOUS | Status: DC
  Filled 2016-07-19: qty 17

## 2016-07-19 MED ORDER — SODIUM CHLORIDE 0.9 % IV SOLN
Freq: Once | INTRAVENOUS | Status: AC
  Administered 2016-07-19: 12:00:00 via INTRAVENOUS
  Filled 2016-07-19: qty 1000

## 2016-07-19 NOTE — Patient Instructions (Signed)

## 2016-08-26 DIAGNOSIS — M1990 Unspecified osteoarthritis, unspecified site: Secondary | ICD-10-CM | POA: Diagnosis not present

## 2016-08-26 DIAGNOSIS — Z01812 Encounter for preprocedural laboratory examination: Secondary | ICD-10-CM | POA: Diagnosis not present

## 2016-08-26 DIAGNOSIS — R5382 Chronic fatigue, unspecified: Secondary | ICD-10-CM | POA: Diagnosis not present

## 2016-08-26 DIAGNOSIS — Z01818 Encounter for other preprocedural examination: Secondary | ICD-10-CM | POA: Diagnosis not present

## 2016-08-26 DIAGNOSIS — E538 Deficiency of other specified B group vitamins: Secondary | ICD-10-CM | POA: Diagnosis not present

## 2016-08-30 ENCOUNTER — Other Ambulatory Visit: Payer: Commercial Managed Care - HMO

## 2016-09-06 ENCOUNTER — Ambulatory Visit: Payer: Commercial Managed Care - HMO | Admitting: Hematology

## 2016-09-18 DIAGNOSIS — N920 Excessive and frequent menstruation with regular cycle: Secondary | ICD-10-CM | POA: Diagnosis not present

## 2016-09-18 DIAGNOSIS — N814 Uterovaginal prolapse, unspecified: Secondary | ICD-10-CM | POA: Diagnosis not present

## 2016-09-18 DIAGNOSIS — N3281 Overactive bladder: Secondary | ICD-10-CM | POA: Diagnosis not present

## 2016-10-10 DIAGNOSIS — Z9889 Other specified postprocedural states: Secondary | ICD-10-CM | POA: Diagnosis not present

## 2016-10-10 DIAGNOSIS — Z09 Encounter for follow-up examination after completed treatment for conditions other than malignant neoplasm: Secondary | ICD-10-CM | POA: Diagnosis not present

## 2016-10-11 ENCOUNTER — Other Ambulatory Visit: Payer: Medicare HMO

## 2016-11-12 DIAGNOSIS — R188 Other ascites: Secondary | ICD-10-CM | POA: Diagnosis not present

## 2016-11-12 DIAGNOSIS — Z09 Encounter for follow-up examination after completed treatment for conditions other than malignant neoplasm: Secondary | ICD-10-CM | POA: Diagnosis not present

## 2016-12-20 DIAGNOSIS — R1900 Intra-abdominal and pelvic swelling, mass and lump, unspecified site: Secondary | ICD-10-CM | POA: Diagnosis not present

## 2016-12-20 DIAGNOSIS — R1033 Periumbilical pain: Secondary | ICD-10-CM | POA: Diagnosis not present

## 2017-01-10 ENCOUNTER — Telehealth: Payer: Self-pay | Admitting: *Deleted

## 2017-01-10 ENCOUNTER — Other Ambulatory Visit: Payer: Medicare HMO

## 2017-01-10 ENCOUNTER — Ambulatory Visit: Payer: Medicare HMO | Admitting: Hematology

## 2017-01-10 NOTE — Telephone Encounter (Signed)
Message left for pt to call back with reschedule info and reason for no-show.

## 2017-01-13 ENCOUNTER — Telehealth: Payer: Self-pay | Admitting: Hematology

## 2017-01-13 NOTE — Telephone Encounter (Signed)
Left voicemail for patient regarding her upcoming appts. Sending her confirmation letter.

## 2017-02-20 ENCOUNTER — Ambulatory Visit (HOSPITAL_BASED_OUTPATIENT_CLINIC_OR_DEPARTMENT_OTHER): Payer: Medicare HMO | Admitting: Nurse Practitioner

## 2017-02-20 ENCOUNTER — Other Ambulatory Visit (HOSPITAL_BASED_OUTPATIENT_CLINIC_OR_DEPARTMENT_OTHER): Payer: Medicare HMO

## 2017-02-20 VITALS — BP 118/69 | HR 79 | Temp 98.6°F | Resp 20 | Ht 66.0 in | Wt 192.2 lb

## 2017-02-20 DIAGNOSIS — D509 Iron deficiency anemia, unspecified: Secondary | ICD-10-CM | POA: Diagnosis not present

## 2017-02-20 DIAGNOSIS — E559 Vitamin D deficiency, unspecified: Secondary | ICD-10-CM

## 2017-02-20 DIAGNOSIS — E509 Vitamin A deficiency, unspecified: Secondary | ICD-10-CM

## 2017-02-20 DIAGNOSIS — D649 Anemia, unspecified: Secondary | ICD-10-CM

## 2017-02-20 DIAGNOSIS — E610 Copper deficiency: Secondary | ICD-10-CM

## 2017-02-20 LAB — FERRITIN: Ferritin: 17 ng/ml (ref 9–269)

## 2017-02-20 LAB — IRON AND TIBC
%SAT: 14 % — ABNORMAL LOW (ref 21–57)
Iron: 52 ug/dL (ref 41–142)
TIBC: 373 ug/dL (ref 236–444)
UIBC: 321 ug/dL (ref 120–384)

## 2017-02-20 LAB — CBC & DIFF AND RETIC
BASO%: 0.3 % (ref 0.0–2.0)
Basophils Absolute: 0 10*3/uL (ref 0.0–0.1)
EOS%: 0.8 % (ref 0.0–7.0)
Eosinophils Absolute: 0.1 10*3/uL (ref 0.0–0.5)
HCT: 34.9 % (ref 34.8–46.6)
HGB: 11.2 g/dL — ABNORMAL LOW (ref 11.6–15.9)
Immature Retic Fract: 7.4 % (ref 1.60–10.00)
LYMPH%: 17.5 % (ref 14.0–49.7)
MCH: 31.9 pg (ref 25.1–34.0)
MCHC: 32.1 g/dL (ref 31.5–36.0)
MCV: 99.4 fL (ref 79.5–101.0)
MONO#: 0.4 10*3/uL (ref 0.1–0.9)
MONO%: 4.6 % (ref 0.0–14.0)
NEUT#: 6.1 10*3/uL (ref 1.5–6.5)
NEUT%: 76.8 % (ref 38.4–76.8)
Platelets: 189 10*3/uL (ref 145–400)
RBC: 3.51 10*6/uL — ABNORMAL LOW (ref 3.70–5.45)
RDW: 13.6 % (ref 11.2–14.5)
Retic %: 2.7 % — ABNORMAL HIGH (ref 0.70–2.10)
Retic Ct Abs: 94.77 10*3/uL — ABNORMAL HIGH (ref 33.70–90.70)
WBC: 7.9 10*3/uL (ref 3.9–10.3)
lymph#: 1.4 10*3/uL (ref 0.9–3.3)
nRBC: 0 % (ref 0–0)

## 2017-02-20 NOTE — Progress Notes (Signed)
  [place] OFFICE PROGRESS NOTE   Diagnosis:  Nutritional anemia  OTHER MEDICAL ISSUES: 1. History of pseudotumor cerebri which developed in 1994 attributed to a viral illness, at which time she also had Guillain-Barre.This resulted in blindness and also required a ventriculoperitoneal shunt which was removed. 2. History of morbid obesity and had attempted to have bariatric surgery in [place] by Dr. Lorin Picket. Medical records indicated she had a duodenal switch procedure done in 2007.  CURRENT THERAPY:  Feraheme , as needed, MVI and copper IV replacement as needed started in Aug 2016   INTERVAL HISTORY:   Mackenzie [name] returns as scheduled. She noted feeling better after receiving copper and a multivitamin infusion 07/19/2016. Her main complaint today is increased sleepiness over the past few months. She reports this has occurred in the past when she is vitamin deficient. She otherwise feels well. She has a good appetite. No bowel or bladder problems.  Objective:  Vital signs in last 24 hours:  Blood pressure 118/69, pulse 79, temperature 98.6 F (37 C), temperature source Oral, resp. rate 20, height 5\' 6"  (1.676 m), weight 192 lb 3.2 oz (87.2 kg), SpO2 100 %.    Resp: lungs clear bilaterally. Cardio: regular rate and rhythm. GI: abdomen soft and nontender. No hepatomegaly.Well-healed surgical incisions. Vascular: no leg edema. Neuro: alert and oriented.    Lab Results:  Lab Results  Component Value Date   WBC 7.9 02/20/2017   HGB 11.2 (L) 02/20/2017   HCT 34.9 02/20/2017   MCV 99.4 02/20/2017   PLT 189 02/20/2017   NEUTROABS 6.1 02/20/2017    Imaging:  No results found.  Medications: I have reviewed the patient's current medications.  Assessment/Plan: 1. Nutritional anemia 2. Other vitamin deficiency 3. Hypocalcemia 4. Menorrhagia 5. Abdominoplasty June 2018   Disposition: Mackenzie [name] appears stable. We reviewed the CBC from today.  Hemoglobin is down slightly as compared to 6 months ago. We will follow-up on the outstanding labs from today and contact her with those results and any recommended infusions. She will return for a lab appointment in 3 months, lab and follow-up in 6 months. She will contact the office in the interim with any problems.    Ned Card ANP/GNP-BC   02/20/2017  12:35 PM

## 2017-02-21 ENCOUNTER — Telehealth: Payer: Self-pay | Admitting: Hematology

## 2017-02-21 LAB — VITAMIN D 25 HYDROXY (VIT D DEFICIENCY, FRACTURES): Vitamin D, 25-Hydroxy: 19.5 ng/mL — ABNORMAL LOW (ref 30.0–100.0)

## 2017-02-21 NOTE — Telephone Encounter (Signed)
Scheduled appt per 10/25 los - phone did not ring - sent reminder letter in the mail.

## 2017-02-22 LAB — ZINC: Zinc, Plasma or Serum: 69 ug/dL (ref 56–134)

## 2017-02-22 LAB — COPPER, SERUM: Copper: 55 ug/dL — ABNORMAL LOW (ref 72–166)

## 2017-02-22 LAB — VITAMIN A: Vitamin A: 32.6 ug/dL — ABNORMAL LOW (ref 33.1–100.0)

## 2017-02-24 LAB — VITAMIN C: Vitamin C: 0.8 mg/dL (ref 0.2–2.0)

## 2017-02-25 ENCOUNTER — Other Ambulatory Visit: Payer: Self-pay | Admitting: Hematology

## 2017-02-26 DIAGNOSIS — F5101 Primary insomnia: Secondary | ICD-10-CM | POA: Diagnosis not present

## 2017-02-26 DIAGNOSIS — F330 Major depressive disorder, recurrent, mild: Secondary | ICD-10-CM | POA: Diagnosis not present

## 2017-02-27 ENCOUNTER — Telehealth: Payer: Self-pay

## 2017-02-27 NOTE — Telephone Encounter (Signed)
Called and left a message with new appts per 10/30 inbasket  Aaliyah Cancro

## 2017-03-12 ENCOUNTER — Ambulatory Visit: Payer: Medicare HMO

## 2017-03-12 ENCOUNTER — Ambulatory Visit (HOSPITAL_BASED_OUTPATIENT_CLINIC_OR_DEPARTMENT_OTHER): Payer: Medicare HMO

## 2017-03-12 VITALS — BP 153/93 | HR 93 | Temp 98.6°F | Resp 18

## 2017-03-12 DIAGNOSIS — D509 Iron deficiency anemia, unspecified: Secondary | ICD-10-CM

## 2017-03-12 MED ORDER — SODIUM CHLORIDE 0.9 % IV SOLN
INTRAVENOUS | Status: DC
  Administered 2017-03-12: 13:00:00 via INTRAVENOUS

## 2017-03-12 MED ORDER — SODIUM CHLORIDE 0.9 % IV SOLN
Freq: Once | INTRAVENOUS | Status: AC
  Administered 2017-03-12: 14:00:00 via INTRAVENOUS
  Filled 2017-03-12: qty 1000

## 2017-03-12 MED ORDER — SODIUM CHLORIDE 0.9 % IV SOLN
510.0000 mg | Freq: Once | INTRAVENOUS | Status: AC
  Administered 2017-03-12: 510 mg via INTRAVENOUS
  Filled 2017-03-12: qty 17

## 2017-03-12 NOTE — Patient Instructions (Signed)
Dehydration, Adult Dehydration is when there is not enough fluid or water in your body. This happens when you lose more fluids than you take in. Dehydration can range from mild to very bad. It should be treated right away to keep it from getting very bad. Symptoms of mild dehydration may include:  Thirst.  Dry lips.  Slightly dry mouth.  Dry, warm skin.  Dizziness. Symptoms of moderate dehydration may include:  Very dry mouth.  Muscle cramps.  Dark pee (urine). Pee may be the color of tea.  Your body making less pee.  Your eyes making fewer tears.  Heartbeat that is uneven or faster than normal (palpitations).  Headache.  Light-headedness, especially when you stand up from sitting.  Fainting (syncope). Symptoms of very bad dehydration may include:  Changes in skin, such as: ? Cold and clammy skin. ? Blotchy (mottled) or pale skin. ? Skin that does not quickly return to normal after being lightly pinched and let go (poor skin turgor).  Changes in body fluids, such as: ? Feeling very thirsty. ? Your eyes making fewer tears. ? Not sweating when body temperature is high, such as in hot weather. ? Your body making very little pee.  Changes in vital signs, such as: ? Weak pulse. ? Pulse that is more than 100 beats a minute when you are sitting still. ? Fast breathing. ? Low blood pressure.  Other changes, such as: ? Sunken eyes. ? Cold hands and feet. ? Confusion. ? Lack of energy (lethargy). ? Trouble waking up from sleep. ? Short-term weight loss. ? Unconsciousness. Follow these instructions at home:  If told by your doctor, drink an ORS: ? Make an ORS by using instructions on the package. ? Start by drinking small amounts, about  cup (120 mL) every 5-10 minutes. ? Slowly drink more until you have had the amount that your doctor said to have.  Drink enough clear fluid to keep your pee clear or pale yellow. If you were told to drink an ORS, finish the ORS  first, then start slowly drinking clear fluids. Drink fluids such as: ? Water. Do not drink only water by itself. Doing that can make the salt (sodium) level in your body get too low (hyponatremia). ? Ice chips. ? Fruit juice that you have added water to (diluted). ? Low-calorie sports drinks.  Avoid: ? Alcohol. ? Drinks that have a lot of sugar. These include high-calorie sports drinks, fruit juice that does not have water added, and soda. ? Caffeine. ? Foods that are greasy or have a lot of fat or sugar.  Take over-the-counter and prescription medicines only as told by your doctor.  Do not take salt tablets. Doing that can make the salt level in your body get too high (hypernatremia).  Eat foods that have minerals (electrolytes). Examples include bananas, oranges, potatoes, tomatoes, and spinach.  Keep all follow-up visits as told by your doctor. This is important. Contact a doctor if:  You have belly (abdominal) pain that: ? Gets worse. ? Stays in one area (localizes).  You have a rash.  You have a stiff neck.  You get angry or annoyed more easily than normal (irritability).  You are more sleepy than normal.  You have a harder time waking up than normal.  You feel: ? Weak. ? Dizzy. ? Very thirsty.  You have peed (urinated) only a small amount of very dark pee during 6-8 hours. Get help right away if:  You have symptoms of   very bad dehydration.  You cannot drink fluids without throwing up (vomiting).  Your symptoms get worse with treatment.  You have a fever.  You have a very bad headache.  You are throwing up or having watery poop (diarrhea) and it: ? Gets worse. ? Does not go away.  You have blood or something green (bile) in your throw-up.  You have blood in your poop (stool). This may cause poop to look black and tarry.  You have not peed in 6-8 hours.  You pass out (faint).  Your heart rate when you are sitting still is more than 100 beats a  minute.  You have trouble breathing. This information is not intended to replace advice given to you by your health care provider. Make sure you discuss any questions you have with your health care provider. Document Released: 02/09/2009 Document Revised: 11/03/2015 Document Reviewed: 06/09/2015 Elsevier Interactive Patient Education  2018 [place] [employer] What is this medicine? FERUMOXYTOL is an iron complex. Iron is used to make healthy red blood cells, which carry oxygen and nutrients throughout the body. This medicine is used to treat iron deficiency anemia in people with chronic kidney disease. This medicine may be used for other purposes; ask your health care provider or pharmacist if you have questions. COMMON BRAND NAME(S): Feraheme What should I tell my health care provider before I take this medicine? They need to know if you have any of these conditions: -anemia not caused by low iron levels -high levels of iron in the blood -magnetic resonance imaging (MRI) test scheduled -an unusual or allergic reaction to iron, other medicines, foods, dyes, or preservatives -pregnant or trying to get pregnant -breast-feeding How should I use this medicine? This medicine is for injection into a vein. It is given by a health care professional in a hospital or clinic setting. Talk to your pediatrician regarding the use of this medicine in children. Special care may be needed. Overdosage: If you think you have taken too much of this medicine contact a poison control center or emergency room at once. NOTE: This medicine is only for you. Do not share this medicine with others. What if I miss a dose? It is important not to miss your dose. Call your doctor or health care professional if you are unable to keep an appointment. What may interact with this medicine? This medicine may interact with the following medications: -other iron products This list may not describe all  possible interactions. Give your health care provider a list of all the medicines, herbs, non-prescription drugs, or dietary supplements you use. Also tell them if you smoke, drink alcohol, or use illegal drugs. Some items may interact with your medicine. What should I watch for while using this medicine? Visit your doctor or healthcare professional regularly. Tell your doctor or healthcare professional if your symptoms do not start to get better or if they get worse. You may need blood work done while you are taking this medicine. You may need to follow a special diet. Talk to your doctor. Foods that contain iron include: whole grains/cereals, dried fruits, beans, or peas, leafy green vegetables, and organ meats (liver, kidney). What side effects may I notice from receiving this medicine? Side effects that you should report to your doctor or health care professional as soon as possible: -allergic reactions like skin rash, itching or hives, swelling of the face, lips, or tongue -breathing problems -changes in blood pressure -feeling faint or lightheaded, falls -fever  or chills -flushing, sweating, or hot feelings -swelling of the ankles or feet Side effects that usually do not require medical attention (report to your doctor or health care professional if they continue or are bothersome): -diarrhea -headache -nausea, vomiting -stomach pain This list may not describe all possible side effects. Call your doctor for medical advice about side effects. You may report side effects to FDA at 1-800-FDA-1088. Where should I keep my medicine? This drug is given in a hospital or clinic and will not be stored at home. NOTE: This sheet is a summary. It may not cover all possible information. If you have questions about this medicine, talk to your doctor, pharmacist, or health care provider.  2018 Elsevier/Gold Standard (2015-05-18 12:41:49)

## 2017-03-12 NOTE — Progress Notes (Signed)
Dr. Burr Medico aware of lab work from 10/25. Pt to receive IVF with vitamins/copper and feraheme today.

## 2017-03-19 ENCOUNTER — Ambulatory Visit: Payer: Medicare HMO

## 2017-04-04 ENCOUNTER — Ambulatory Visit (HOSPITAL_BASED_OUTPATIENT_CLINIC_OR_DEPARTMENT_OTHER): Payer: Medicare HMO

## 2017-04-04 VITALS — BP 131/75 | HR 87 | Temp 98.7°F | Resp 17

## 2017-04-04 DIAGNOSIS — D509 Iron deficiency anemia, unspecified: Secondary | ICD-10-CM | POA: Diagnosis not present

## 2017-04-04 MED ORDER — FERUMOXYTOL INJECTION 510 MG/17 ML
510.0000 mg | Freq: Once | INTRAVENOUS | Status: AC
  Administered 2017-04-04: 510 mg via INTRAVENOUS
  Filled 2017-04-04: qty 17

## 2017-04-04 NOTE — Patient Instructions (Signed)

## 2017-05-23 ENCOUNTER — Other Ambulatory Visit: Payer: Medicare HMO

## 2017-05-23 ENCOUNTER — Telehealth: Payer: Self-pay | Admitting: Hematology

## 2017-05-23 NOTE — Telephone Encounter (Signed)
Patient called to reschedule  °

## 2017-05-30 ENCOUNTER — Inpatient Hospital Stay: Payer: Medicare HMO | Attending: Hematology

## 2017-05-30 DIAGNOSIS — E509 Vitamin A deficiency, unspecified: Secondary | ICD-10-CM | POA: Insufficient documentation

## 2017-05-30 DIAGNOSIS — E559 Vitamin D deficiency, unspecified: Secondary | ICD-10-CM | POA: Insufficient documentation

## 2017-05-30 DIAGNOSIS — E610 Copper deficiency: Secondary | ICD-10-CM | POA: Diagnosis not present

## 2017-05-30 DIAGNOSIS — E539 Vitamin B deficiency, unspecified: Secondary | ICD-10-CM | POA: Insufficient documentation

## 2017-05-30 DIAGNOSIS — N920 Excessive and frequent menstruation with regular cycle: Secondary | ICD-10-CM | POA: Insufficient documentation

## 2017-05-30 DIAGNOSIS — D509 Iron deficiency anemia, unspecified: Secondary | ICD-10-CM

## 2017-05-30 DIAGNOSIS — D649 Anemia, unspecified: Secondary | ICD-10-CM

## 2017-05-30 LAB — RETICULOCYTES
RBC.: 3.38 MIL/uL — ABNORMAL LOW (ref 3.70–5.45)
Retic Count, Absolute: 98 10*3/uL — ABNORMAL HIGH (ref 33.7–90.7)
Retic Ct Pct: 2.9 % — ABNORMAL HIGH (ref 0.7–2.1)

## 2017-05-30 LAB — CBC WITH DIFFERENTIAL (CANCER CENTER ONLY)
Basophils Absolute: 0 10*3/uL (ref 0.0–0.1)
Basophils Relative: 0 %
Eosinophils Absolute: 0 10*3/uL (ref 0.0–0.5)
Eosinophils Relative: 0 %
HCT: 34.6 % — ABNORMAL LOW (ref 34.8–46.6)
Hemoglobin: 11.2 g/dL — ABNORMAL LOW (ref 11.6–15.9)
Lymphocytes Relative: 19 %
Lymphs Abs: 2 10*3/uL (ref 0.9–3.3)
MCH: 33.1 pg (ref 25.1–34.0)
MCHC: 32.4 g/dL (ref 31.5–36.0)
MCV: 102.4 fL — ABNORMAL HIGH (ref 79.5–101.0)
Monocytes Absolute: 0.5 10*3/uL (ref 0.1–0.9)
Monocytes Relative: 5 %
Neutro Abs: 7.8 10*3/uL — ABNORMAL HIGH (ref 1.5–6.5)
Neutrophils Relative %: 76 %
Platelet Count: 174 10*3/uL (ref 145–400)
RBC: 3.38 MIL/uL — ABNORMAL LOW (ref 3.70–5.45)
RDW: 13.3 % (ref 11.2–14.5)
WBC Count: 10.4 10*3/uL — ABNORMAL HIGH (ref 3.9–10.3)

## 2017-05-31 LAB — VITAMIN D 25 HYDROXY (VIT D DEFICIENCY, FRACTURES): Vit D, 25-Hydroxy: 16.4 ng/mL — ABNORMAL LOW (ref 30.0–100.0)

## 2017-05-31 LAB — ZINC: Zinc: 47 ug/dL — ABNORMAL LOW (ref 56–134)

## 2017-05-31 LAB — COPPER, SERUM: Copper: 54 ug/dL — ABNORMAL LOW (ref 72–166)

## 2017-06-02 DIAGNOSIS — N760 Acute vaginitis: Secondary | ICD-10-CM | POA: Diagnosis not present

## 2017-06-02 DIAGNOSIS — F330 Major depressive disorder, recurrent, mild: Secondary | ICD-10-CM | POA: Diagnosis not present

## 2017-06-02 DIAGNOSIS — Z202 Contact with and (suspected) exposure to infections with a predominantly sexual mode of transmission: Secondary | ICD-10-CM | POA: Diagnosis not present

## 2017-06-02 DIAGNOSIS — F5101 Primary insomnia: Secondary | ICD-10-CM | POA: Diagnosis not present

## 2017-06-02 LAB — VITAMIN C: Vitamin C: 0.6 mg/dL (ref 0.2–2.0)

## 2017-06-02 LAB — IRON AND TIBC
Iron: 81 ug/dL (ref 41–142)
Saturation Ratios: 29 % (ref 21–57)
TIBC: 279 ug/dL (ref 236–444)
UIBC: 198 ug/dL

## 2017-06-02 LAB — VITAMIN A: Vitamin A (Retinoic Acid): 21.3 ug/dL (ref 20.1–62.0)

## 2017-06-02 LAB — FERRITIN: Ferritin: 127 ng/mL (ref 9–269)

## 2017-06-16 ENCOUNTER — Telehealth: Payer: Self-pay | Admitting: *Deleted

## 2017-06-16 NOTE — Telephone Encounter (Signed)
-----   Message from Truitt Merle, MD sent at 06/15/2017  5:33 PM EST ----- Please let pt know her lab result, I will set up MVI, copper infusion for her, thanks  Truitt Merle  06/15/2017

## 2017-06-16 NOTE — Telephone Encounter (Signed)
Called pt & informed of lab results & need for Copper infusion.  She had some questions & concerns & would like to see or talk with Dr Burr Medico before April.  Informed that message would be sent to Dr Burr Medico.

## 2017-06-17 ENCOUNTER — Telehealth: Payer: Self-pay | Admitting: Hematology

## 2017-06-17 NOTE — Telephone Encounter (Signed)
Spoke with patient regarding appointment for IVF Feraheme per 2/17 sch msg

## 2017-06-18 ENCOUNTER — Ambulatory Visit: Payer: Medicare HMO

## 2017-06-19 ENCOUNTER — Ambulatory Visit (HOSPITAL_BASED_OUTPATIENT_CLINIC_OR_DEPARTMENT_OTHER): Payer: Medicare HMO | Admitting: Hematology

## 2017-06-19 ENCOUNTER — Inpatient Hospital Stay: Payer: Medicare HMO

## 2017-06-19 ENCOUNTER — Encounter: Payer: Self-pay | Admitting: Hematology

## 2017-06-19 VITALS — BP 121/68 | HR 88 | Temp 99.0°F | Resp 16

## 2017-06-19 DIAGNOSIS — E539 Vitamin B deficiency, unspecified: Secondary | ICD-10-CM | POA: Diagnosis not present

## 2017-06-19 DIAGNOSIS — N920 Excessive and frequent menstruation with regular cycle: Secondary | ICD-10-CM

## 2017-06-19 DIAGNOSIS — D509 Iron deficiency anemia, unspecified: Secondary | ICD-10-CM

## 2017-06-19 DIAGNOSIS — E559 Vitamin D deficiency, unspecified: Secondary | ICD-10-CM

## 2017-06-19 DIAGNOSIS — E509 Vitamin A deficiency, unspecified: Secondary | ICD-10-CM | POA: Diagnosis not present

## 2017-06-19 DIAGNOSIS — E610 Copper deficiency: Secondary | ICD-10-CM

## 2017-06-19 DIAGNOSIS — D539 Nutritional anemia, unspecified: Secondary | ICD-10-CM | POA: Diagnosis not present

## 2017-06-19 MED ORDER — M.V.I. ADULT IV INJ
Freq: Once | INTRAVENOUS | Status: AC
  Administered 2017-06-19: 17:00:00 via INTRAVENOUS
  Filled 2017-06-19: qty 1000

## 2017-06-19 NOTE — Progress Notes (Signed)
[facility] OFFICE PROGRESS NOTE  Mackenzie Small, MD [address] [place] [address]  DIAGNOSIS: Iron deficiency anemia, unspecified iron deficiency anemia type  Vitamin A deficiency  Vitamin D deficiency  Copper deficiency  No chief complaint on file.  OTHER MEDICAL ISSUES: 1. History of pseudotumor cerebri which developed in 1994 attributed to a viral illness, at which time she also had Guillain-Barre.This resulted in blindness and also required a ventriculoperitoneal shunt which was removed. 2. History of morbid obesity and had attempted to have bariatric surgery in [place] by Dr. Lorin Picket. Medical records indicated she had a duodenal switch procedure done in 2007.  CURRENT THERAPY:  Feraheme , as needed, MVI and copper IV replacement as needed started in Aug 2016   INTERVAL HISTORY: Mackenzie [name] 42 y.o. female with a history of blindness, obesity s/p gastric bypass, who presents for follow-up for her multiple nutritional deficiency. She reports more fatigue lately, her leg cramps has improved.  She has been vitamin C, A, E, calcium, high-dose vitamin D, B, and oral iron. No other new complains.   MEDICAL HISTORY: Past Medical History:  Diagnosis Date  . Anemia    history  . Blind 1994  . Blood transfusion 04/2010   [facility] center at [facility] long - iron transfusion per pt  . Breast lump left  . Depression    no meds  . H/O hiatal hernia    repaired with weight loss surgery  . History of uterine fibroid   . Neuromuscular disorder ([place])    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  . Pseudotumor cerebri 1994   History - caused blind    ALLERGIES:  is allergic to influenza vaccines.  MEDICATIONS: has a current medication list which includes the following prescription(s): vitamin c, calcium carbonate, cholecalciferol, cyanocobalamin, ferrous fumarate, ibuprofen, medroxyprogesterone, prenatal vitamin w/fe, fa, probiotic  product, vitamin a, and vitamin e.  SURGICAL HISTORY:  Past Surgical History:  Procedure Laterality Date  . APPENDECTOMY     removed with switch surgery  . BREAST SURGERY  2012   right breast lumpectomy  . CHOLECYSTECTOMY     removed with switch surgery  . eye decompression x3  1994   Bilateral   . FETAL BLOOD TRANSFUSION  jan 29,2012  . lp shunt  1994   removed in 2009  . MYOMECTOMY    . rurod switch  2007   weight loss surgery done in [place] (duodenal switch)    REVIEW OF SYSTEMS:   Constitutional: Denies fevers, chills or abnormal weight loss (+) fatigue Eyes: Denies blurriness of vision  Ears, nose, mouth, throat, and face: Denies mucositis or sore throat Respiratory: Denies cough, dyspnea or wheezes Cardiovascular: Denies palpitation, chest discomfort or lower extremity swelling Gastrointestinal:  Denies nausea, heartburn or change in bowel habits Skin: Denies abnormal skin rashes Lymphatics: Denies new lymphadenopathy or easy bruising Neurological:Denies numbness, tingling or new weaknesses Behavioral/Psych: Mood is stable, no new changes  Musculoskeletal: (+) leg muscle cramps All other systems were reviewed with the patient and are negative.  PHYSICAL EXAMINATION: ECOG PERFORMANCE STATUS: 1  There were no vitals taken for this visit.   GENERAL:alert, no distress and comfortable; blind SKIN: skin color, texture, turgor are normal, no rashes or significant lesions EYES: normal, Conjunctiva are pink and non-injected, sclera clear OROPHARYNX:no exudate, no erythema and lips, buccal mucosa, and tongue normal  NECK: supple, thyroid normal size, non-tender, without nodularity LYMPH:  no palpable lymphadenopathy in the  cervical, axillary or supraclavicular LUNGS: clear to auscultation and percussion with normal breathing effort HEART: regular rate & rhythm and no murmurs and no lower extremity edema ABDOMEN:abdomen soft, non-tender and normal bowel  sounds Musculoskeletal:no cyanosis of digits and no clubbing  NEURO: alert & oriented x 3 with fluent speech, no focal motor/sensory deficits   LABORATORY DATA: CBC Latest Ref Rng & Units 05/30/2017 02/20/2017 07/12/2016  WBC 3.9 - 10.3 K/uL 10.4(H) 7.9 10.2  Hemoglobin 11.6 - 15.9 g/dL - 11.2(L) 11.6  Hematocrit 34.8 - 46.6 % 34.6(L) 34.9 35.8  Platelets 145 - 400 K/uL 174 189 209   CMP Latest Ref Rng & Units 03/08/2016 09/08/2015 06/26/2015  Glucose 70 - 140 mg/dl 78 75 80  BUN 7.0 - 26.0 mg/dL 15.5 15.5 20.5  Creatinine 0.6 - 1.1 mg/dL 0.7 0.7 0.8  Sodium 136 - 145 mEq/L 137 140 138  Potassium 3.5 - 5.1 mEq/L 4.0 3.9 4.1  Chloride 96 - 112 mEq/L - - -  CO2 22 - 29 mEq/L 24 25 24   Calcium 8.4 - 10.4 mg/dL 9.0 9.1 8.8  Total Protein 6.4 - 8.3 g/dL 7.6 7.4 7.7  Total Bilirubin 0.20 - 1.20 mg/dL 1.05 0.63 0.97  Alkaline Phos 40 - 150 U/L 68 51 45  AST 5 - 34 U/L 28 19 27   ALT 0 - 55 U/L 25 17 19    RADIOGRAPHIC STUDIES: No new study   ASSESSMENT: Mackenzie [name] 42 y.o. female with a history of iron deficient anemia and multiple nutritional deficiency due to her prior duodenal switch procedure in 2007.  1. Nutritional anemia. - she was diagnosed with iron deficient anemia secondary to her duodenal switch procedure, and heavy menstrual period. She responded well to IV iron. - she now has macrocytic anemia,  but the Z61 and folic acid level are normal -her lab work also showed low Cooper and zinc level, which may also contribute to her anemia -Her anemia did not correct completely by IV iron. She likely has as a component of other nutritional deficient anemia, possible secondary to her duodenal switch procedure -her recent iron study was normal  -she has been receiving copper and zinc replacement since8/2016, her anemia did not improve much but she felt much better after infusion -I suggest her continue taking multivitamin with minerals, which contains Copper about 1-3mg  a day, and  zinc 100 mg per day -she will also continue oral ferrous sulfate by mouth 2 tablets a day -Labs reviewed Hgb 11.2 a few weeks ago.    2.  Other vitamin deficiency -She has received 600K U Vit D and 50K U VitA at Dr. Lucinda Dell office in 2016, now on high oral dose  -she received multivitamin 40ml (containing vitamin 400 unit, vitamin A 6600 unit) intravenous infusion in 11/2014, and did feel much better after infusion.  -We'll repeat her automated A and D level, if it's low, we'll consider multivitamin infusion again  -She would like this checked every 3 months rather than every 6 months.  -Her recent lab showed low copper, zinc, and vitamin D level, she will receive multivitamin infusion today.  3. Hypocalcemia - hypocalcemia could be related to her vitamin D deficiency, she'll continue oral calcium and vitamin D. - follow-up her level  -Currently leg cramps has improved  4. Menorrhagia -The IUD was taken out last month, she has since began having heavy periods again -she is on OCT now, her period has been lighter now   She will continue to  follow-up with her primary care physician for  other medical issues.  Plan -lab reviewed today.  -MVI and copper Infusion today. We will likely continue this infusion every 6 months  -Monitor CBC, iron studies, copper, zinc and vitamin A/C/D levels every 3 months. -I'll see her back in 6 months with lab and infusion    All questions were answered. The patient knows to call the clinic with any problems, questions or concerns. We can certainly see the patient much sooner if necessary.  I spent 15 minutes counseling the patient face to face. The total time spent in the appointment was 20 minutes.    Truitt Merle, MD 06/19/2017

## 2017-06-20 ENCOUNTER — Telehealth: Payer: Self-pay | Admitting: Hematology

## 2017-06-20 NOTE — Telephone Encounter (Signed)
Appointments scheduled and letter mailed to patient with appointment information

## 2017-06-21 ENCOUNTER — Encounter: Payer: Self-pay | Admitting: Hematology

## 2017-06-21 DIAGNOSIS — E610 Copper deficiency: Secondary | ICD-10-CM | POA: Insufficient documentation

## 2017-06-27 DIAGNOSIS — N898 Other specified noninflammatory disorders of vagina: Secondary | ICD-10-CM | POA: Diagnosis not present

## 2017-06-27 DIAGNOSIS — N3941 Urge incontinence: Secondary | ICD-10-CM | POA: Diagnosis not present

## 2017-06-27 DIAGNOSIS — N83209 Unspecified ovarian cyst, unspecified side: Secondary | ICD-10-CM | POA: Diagnosis not present

## 2017-06-27 DIAGNOSIS — Z01411 Encounter for gynecological examination (general) (routine) with abnormal findings: Secondary | ICD-10-CM | POA: Diagnosis not present

## 2017-06-27 DIAGNOSIS — N921 Excessive and frequent menstruation with irregular cycle: Secondary | ICD-10-CM | POA: Diagnosis not present

## 2017-06-27 DIAGNOSIS — N83202 Unspecified ovarian cyst, left side: Secondary | ICD-10-CM | POA: Diagnosis not present

## 2017-06-30 DIAGNOSIS — R040 Epistaxis: Secondary | ICD-10-CM | POA: Diagnosis not present

## 2017-08-21 ENCOUNTER — Inpatient Hospital Stay: Payer: Medicare HMO | Attending: Hematology

## 2017-08-21 ENCOUNTER — Ambulatory Visit: Payer: Medicare HMO | Admitting: Hematology

## 2017-08-21 ENCOUNTER — Other Ambulatory Visit: Payer: Medicare HMO

## 2017-08-21 DIAGNOSIS — E509 Vitamin A deficiency, unspecified: Secondary | ICD-10-CM

## 2017-08-21 DIAGNOSIS — E539 Vitamin B deficiency, unspecified: Secondary | ICD-10-CM | POA: Diagnosis not present

## 2017-08-21 DIAGNOSIS — D509 Iron deficiency anemia, unspecified: Secondary | ICD-10-CM | POA: Insufficient documentation

## 2017-08-21 DIAGNOSIS — D649 Anemia, unspecified: Secondary | ICD-10-CM

## 2017-08-21 DIAGNOSIS — E559 Vitamin D deficiency, unspecified: Secondary | ICD-10-CM

## 2017-08-21 LAB — CBC WITH DIFFERENTIAL (CANCER CENTER ONLY)
Basophils Absolute: 0 10*3/uL (ref 0.0–0.1)
Basophils Relative: 0 %
Eosinophils Absolute: 0 10*3/uL (ref 0.0–0.5)
Eosinophils Relative: 0 %
HCT: 37.7 % (ref 34.8–46.6)
Hemoglobin: 12.2 g/dL (ref 11.6–15.9)
Lymphocytes Relative: 19 %
Lymphs Abs: 1.9 10*3/uL (ref 0.9–3.3)
MCH: 33.2 pg (ref 25.1–34.0)
MCHC: 32.4 g/dL (ref 31.5–36.0)
MCV: 102.7 fL — ABNORMAL HIGH (ref 79.5–101.0)
Monocytes Absolute: 0.6 10*3/uL (ref 0.1–0.9)
Monocytes Relative: 6 %
Neutro Abs: 7.6 10*3/uL — ABNORMAL HIGH (ref 1.5–6.5)
Neutrophils Relative %: 75 %
Platelet Count: 197 10*3/uL (ref 145–400)
RBC: 3.67 MIL/uL — ABNORMAL LOW (ref 3.70–5.45)
RDW: 13 % (ref 11.2–14.5)
WBC Count: 10.1 10*3/uL (ref 3.9–10.3)

## 2017-08-21 LAB — RETICULOCYTES
RBC.: 3.67 MIL/uL — ABNORMAL LOW (ref 3.70–5.45)
Retic Count, Absolute: 106.4 10*3/uL — ABNORMAL HIGH (ref 33.7–90.7)
Retic Ct Pct: 2.9 % — ABNORMAL HIGH (ref 0.7–2.1)

## 2017-08-22 LAB — FERRITIN: Ferritin: 40 ng/mL (ref 9–269)

## 2017-08-22 LAB — IRON AND TIBC
Iron: 67 ug/dL (ref 41–142)
Saturation Ratios: 22 % (ref 21–57)
TIBC: 303 ug/dL (ref 236–444)
UIBC: 235 ug/dL

## 2017-08-22 LAB — VITAMIN D 25 HYDROXY (VIT D DEFICIENCY, FRACTURES): Vit D, 25-Hydroxy: 16.7 ng/mL — ABNORMAL LOW (ref 30.0–100.0)

## 2017-08-23 LAB — COPPER, SERUM: Copper: 65 ug/dL — ABNORMAL LOW (ref 72–166)

## 2017-08-23 LAB — VITAMIN C: Vitamin C: 0.7 mg/dL (ref 0.2–2.0)

## 2017-08-23 LAB — ZINC: Zinc: 51 ug/dL — ABNORMAL LOW (ref 56–134)

## 2017-08-24 LAB — VITAMIN A: Vitamin A (Retinoic Acid): 33.9 ug/dL (ref 20.1–62.0)

## 2017-09-01 ENCOUNTER — Telehealth: Payer: Self-pay

## 2017-09-01 NOTE — Telephone Encounter (Signed)
-----   Message from Truitt Merle, MD sent at 08/31/2017 12:10 PM EDT ----- Please let pt know the lab results. Her anemia is resolved this time, iron level adequate, but copper, zinc and vitD levels are still low, if she is symptomatic, please set up MVI and copper infusion, thanks.  Truitt Merle  08/31/2017

## 2017-09-01 NOTE — Telephone Encounter (Signed)
Called patient LVM anemia resolved but deficient in several others.  LVM for patient to return call so can she if she is symptomatic and need IV MVI.

## 2017-09-02 ENCOUNTER — Telehealth: Payer: Self-pay | Admitting: Hematology

## 2017-09-02 NOTE — Telephone Encounter (Signed)
Scheduled appt per 5/6 sch msg - left vm for pt re appts.

## 2017-09-05 ENCOUNTER — Inpatient Hospital Stay: Payer: Medicare HMO

## 2017-09-05 ENCOUNTER — Telehealth: Payer: Self-pay | Admitting: *Deleted

## 2017-09-05 NOTE — Telephone Encounter (Signed)
Received message from pt to r/s her infusion.  Message sent to schedulers.

## 2017-09-08 ENCOUNTER — Telehealth: Payer: Self-pay | Admitting: Hematology

## 2017-09-08 NOTE — Telephone Encounter (Signed)
Rescheduled appt per 5/13 sch msg - spoke w/ pt re appts.

## 2017-09-12 ENCOUNTER — Inpatient Hospital Stay: Payer: Medicare HMO | Attending: Hematology

## 2017-09-12 VITALS — BP 111/80 | HR 78 | Temp 98.5°F | Resp 16

## 2017-09-12 DIAGNOSIS — E539 Vitamin B deficiency, unspecified: Secondary | ICD-10-CM | POA: Diagnosis not present

## 2017-09-12 DIAGNOSIS — D509 Iron deficiency anemia, unspecified: Secondary | ICD-10-CM | POA: Diagnosis not present

## 2017-09-12 MED ORDER — SODIUM CHLORIDE 0.9 % IV SOLN
Freq: Once | INTRAVENOUS | Status: DC
  Filled 2017-09-12: qty 1000

## 2017-09-12 MED ORDER — SODIUM CHLORIDE 0.9 % IV SOLN
Freq: Once | INTRAVENOUS | Status: AC
  Administered 2017-09-12: 10:00:00 via INTRAVENOUS
  Filled 2017-09-12: qty 1000

## 2017-09-16 ENCOUNTER — Other Ambulatory Visit: Payer: Medicare HMO

## 2017-09-16 ENCOUNTER — Ambulatory Visit: Payer: Medicare HMO

## 2017-12-10 ENCOUNTER — Inpatient Hospital Stay: Payer: Medicare HMO | Attending: Hematology

## 2017-12-10 DIAGNOSIS — D649 Anemia, unspecified: Secondary | ICD-10-CM

## 2017-12-10 DIAGNOSIS — Z79899 Other long term (current) drug therapy: Secondary | ICD-10-CM | POA: Diagnosis not present

## 2017-12-10 DIAGNOSIS — Z9884 Bariatric surgery status: Secondary | ICD-10-CM | POA: Insufficient documentation

## 2017-12-10 DIAGNOSIS — F17210 Nicotine dependence, cigarettes, uncomplicated: Secondary | ICD-10-CM | POA: Insufficient documentation

## 2017-12-10 DIAGNOSIS — D509 Iron deficiency anemia, unspecified: Secondary | ICD-10-CM | POA: Insufficient documentation

## 2017-12-10 DIAGNOSIS — E559 Vitamin D deficiency, unspecified: Secondary | ICD-10-CM | POA: Insufficient documentation

## 2017-12-10 DIAGNOSIS — D539 Nutritional anemia, unspecified: Secondary | ICD-10-CM | POA: Insufficient documentation

## 2017-12-10 DIAGNOSIS — E509 Vitamin A deficiency, unspecified: Secondary | ICD-10-CM | POA: Insufficient documentation

## 2017-12-10 DIAGNOSIS — E539 Vitamin B deficiency, unspecified: Secondary | ICD-10-CM | POA: Diagnosis present

## 2017-12-10 DIAGNOSIS — H547 Unspecified visual loss: Secondary | ICD-10-CM | POA: Insufficient documentation

## 2017-12-10 DIAGNOSIS — N920 Excessive and frequent menstruation with regular cycle: Secondary | ICD-10-CM | POA: Insufficient documentation

## 2017-12-11 LAB — VITAMIN D 25 HYDROXY (VIT D DEFICIENCY, FRACTURES): Vit D, 25-Hydroxy: 20.6 ng/mL — ABNORMAL LOW (ref 30.0–100.0)

## 2017-12-12 LAB — VITAMIN C: Vitamin C: 0.9 mg/dL (ref 0.2–2.0)

## 2017-12-12 LAB — ZINC: Zinc: 84 ug/dL (ref 56–134)

## 2017-12-12 LAB — COPPER, SERUM: Copper: 81 ug/dL (ref 72–166)

## 2017-12-13 NOTE — Progress Notes (Signed)
[facility] OFFICE PROGRESS NOTE  Mackenzie Small, MD [place] [address] [place] [address]  DIAGNOSIS: Vitamin D deficiency  Vitamin A deficiency  Iron deficiency anemia, unspecified iron deficiency anemia type  No chief complaint on file.  OTHER MEDICAL ISSUES: 1. History of pseudotumor cerebri which developed in 1994 attributed to a viral illness, at which time she also had Guillain-Barre.This resulted in blindness and also required a ventriculoperitoneal shunt which was removed. 2. History of morbid obesity and had attempted to have bariatric surgery in [place] by Dr. Lorin Picket. Medical records indicated she had a duodenal switch procedure done in 2007.  CURRENT THERAPY:  Feraheme , as needed, MVI and copper IV replacement as needed started in Aug 2016   INTERVAL HISTORY Mackenzie [name] 42 y.o. female with a history of blindness, obesity s/p gastric bypass, who presents for follow-up for her multiple nutritional deficiency. She is here with her family member. She is back to smoking, but is trying to quit. She has multiple skin bruises. She is compliant with her vitamins. Her energy level is low and she still has sleeping difficulties. She states that she bumped into the dishwasher and bruised after that.     MEDICAL HISTORY: Past Medical History:  Diagnosis Date  . Anemia    history  . Blind 1994  . Blood transfusion 04/2010   cancer center at [facility] - iron transfusion per pt  . Breast lump left  . Depression    no meds  . H/O hiatal hernia    repaired with weight loss surgery  . History of uterine fibroid   . Neuromuscular disorder ([place])    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  . Pseudotumor cerebri 1994   History - caused blind    ALLERGIES:  is allergic to influenza vaccines.  MEDICATIONS: has a current medication list which includes the following prescription(s): vitamin c, calcium carbonate, cholecalciferol,  cyanocobalamin, ferrous fumarate, ibuprofen, medroxyprogesterone, prenatal vitamin w/fe, fa, probiotic product, vitamin a, and vitamin e.  SURGICAL HISTORY:  Past Surgical History:  Procedure Laterality Date  . APPENDECTOMY     removed with switch surgery  . BREAST SURGERY  2012   right breast lumpectomy  . CHOLECYSTECTOMY     removed with switch surgery  . eye decompression x3  1994   Bilateral   . FETAL BLOOD TRANSFUSION  jan 29,2012  . lp shunt  1994   removed in 2009  . MYOMECTOMY    . rurod switch  2007   weight loss surgery done in [place] (duodenal switch)    REVIEW OF SYSTEMS:   Constitutional: Denies fevers, chills or abnormal weight loss (+) fatigue Eyes: Denies blurriness of vision  Ears, nose, mouth, throat, and face: Denies mucositis or sore throat Respiratory: Denies cough, dyspnea or wheezes Cardiovascular: Denies palpitation, chest discomfort or lower extremity swelling Gastrointestinal:  Denies nausea, heartburn or change in bowel habits Skin: Denies abnormal skin rashes Lymphatics: Denies new lymphadenopathy or easy bruising Neurological:Denies numbness, tingling or new weaknesses Behavioral/Psych: Mood is stable, no new changes  Musculoskeletal: All other systems were reviewed with the patient and are negative.  PHYSICAL EXAMINATION: ECOG PERFORMANCE STATUS: 1  Blood pressure 126/85, pulse 89, temperature 98 F (36.7 C), temperature source Oral, resp. rate 18, height 5\' 6"  (1.676 m), weight 187 lb 8 oz (85 kg), SpO2 99 %.   GENERAL:alert, no distress and comfortable; blind SKIN: skin color, texture, turgor are normal, no rashes  or significant lesions (+) multiple bruises on her LLs EYES: normal, Conjunctiva are pink and non-injected, sclera clear OROPHARYNX:no exudate, no erythema and lips, buccal mucosa, and tongue normal  NECK: supple, thyroid normal size, non-tender, without nodularity LYMPH:  no palpable lymphadenopathy in the cervical, axillary  or supraclavicular LUNGS: clear to auscultation and percussion with normal breathing effort HEART: regular rate & rhythm and no murmurs and no lower extremity edema ABDOMEN:abdomen soft, non-tender and normal bowel sounds Musculoskeletal:no cyanosis of digits and no clubbing  NEURO: alert & oriented x 3 with fluent speech, no focal motor/sensory deficits   LABORATORY DATA: CBC Latest Ref Rng & Units 12/17/2017 08/21/2017 05/30/2017  WBC 3.9 - 10.3 K/uL 10.5(H) 10.1 10.4(H)  Hemoglobin 11.6 - 15.9 g/dL 11.8 12.2 11.2(L)  Hematocrit 34.8 - 46.6 % 35.6 37.7 34.6(L)  Platelets 145 - 400 K/uL 230 197 174   CMP Latest Ref Rng & Units 03/08/2016 09/08/2015 06/26/2015  Glucose 70 - 140 mg/dl 78 75 80  BUN 7.0 - 26.0 mg/dL 15.5 15.5 20.5  Creatinine 0.6 - 1.1 mg/dL 0.7 0.7 0.8  Sodium 136 - 145 mEq/L 137 140 138  Potassium 3.5 - 5.1 mEq/L 4.0 3.9 4.1  Chloride 96 - 112 mEq/L - - -  CO2 22 - 29 mEq/L 24 25 24   Calcium 8.4 - 10.4 mg/dL 9.0 9.1 8.8  Total Protein 6.4 - 8.3 g/dL 7.6 7.4 7.7  Total Bilirubin 0.20 - 1.20 mg/dL 1.05 0.63 0.97  Alkaline Phos 40 - 150 U/L 68 51 45  AST 5 - 34 U/L 28 19 27   ALT 0 - 55 U/L 25 17 19    RADIOGRAPHIC STUDIES: No new study   ASSESSMENT: Mackenzie [name] 42 y.o. female with a history of iron deficient anemia and multiple nutritional deficiency due to her prior duodenal switch procedure in 2007.  1. Nutritional anemia. - she was diagnosed with iron deficient anemia secondary to her duodenal switch procedure, and heavy menstrual period. She responded well to IV iron. - she now has macrocytic anemia,  but the I29 and folic acid level are normal -her lab work also showed low Cooper and zinc level, which may also contribute to her anemia -Her anemia did not correct completely by IV iron. She likely has as a component of other nutritional deficient anemia, possible secondary to her duodenal switch procedure -her recent iron study was normal  -she has been  receiving copper and zinc replacement since8/2016, her anemia did not improve much but she felt much better after infusion -I suggest her continue taking multivitamin with minerals, which contains Copper about 1-3mg  a day, and zinc 100 mg per day -she will also continue oral ferrous sulfate by mouth 2 tablets a day -She is clinically stable and tolerating treatment well. -She complains more fatigue lately, will repeat her iron study today and consider IV iron if needed.  Her copper level was normal last week.    2.  Other vitamin deficiency -She has received 600K U Vit D and 50K U VitA at Dr. Lucinda Dell office in 2016, now on high oral dose  -she received multivitamin 106ml (containing vitamin 400 unit, vitamin A 6600 unit) intravenous infusion in 11/2014, and did feel much better after infusion.  -We'll repeat her automated A and D level, if it's low, we'll consider multivitamin infusion again  -She would like this checked every 3 months rather than every 6 months.  -Her recent lab showed low vitamin D and vitamin D level, she  will receive multivitamin infusion today.  3. Hypocalcemia - hypocalcemia could be related to her vitamin D deficiency, she'll continue oral calcium and vitamin D. - follow-up her level  -Currently leg cramps has improved  4. Menorrhagia -The IUD was taken out last month, she has since began having heavy periods again -she is on OCT now, her period has been lighter now   She will continue to follow-up with her primary care physician for  other medical issues.  Plan -Repeat CBC and iron study today, set up IVR if needed next week -MVI Infusion today. -Monitor CBC, iron studies, copper, zinc and vitamin A/C/D levels every 3 months. -F/u and infusion in 6 months with labs one week before   All questions were answered. The patient knows to call the clinic with any problems, questions or concerns. We can certainly see the patient much sooner if necessary.  I spent  15 minutes counseling the patient face to face. The total time spent in the appointment was 20 minutes.  Dierdre Searles Dweik am acting as scribe for Dr. Truitt Merle.  I have reviewed the above documentation for accuracy and completeness, and I agree with the above.  Truitt Merle, MD 12/17/2017

## 2017-12-15 ENCOUNTER — Telehealth: Payer: Self-pay | Admitting: Hematology

## 2017-12-15 ENCOUNTER — Telehealth: Payer: Self-pay | Admitting: *Deleted

## 2017-12-15 ENCOUNTER — Other Ambulatory Visit: Payer: Self-pay | Admitting: Hematology

## 2017-12-15 DIAGNOSIS — D509 Iron deficiency anemia, unspecified: Secondary | ICD-10-CM

## 2017-12-15 NOTE — Telephone Encounter (Signed)
LVM for patient to call office to get scheduled for an appt per 8/19 sch message.

## 2017-12-15 NOTE — Telephone Encounter (Signed)
Spoke with pt and informed pt that she will receive Multivitamin infusion along with Feraheme infusion on Wed 12/17/17.  Pt voiced understanding.  In basket message forwarded to Demetrius Charity, pharmacist.

## 2017-12-17 ENCOUNTER — Encounter: Payer: Self-pay | Admitting: Hematology

## 2017-12-17 ENCOUNTER — Telehealth: Payer: Self-pay | Admitting: Hematology

## 2017-12-17 ENCOUNTER — Inpatient Hospital Stay: Payer: Medicare HMO

## 2017-12-17 ENCOUNTER — Inpatient Hospital Stay (HOSPITAL_BASED_OUTPATIENT_CLINIC_OR_DEPARTMENT_OTHER): Payer: Medicare HMO | Admitting: Hematology

## 2017-12-17 VITALS — BP 126/85 | HR 89 | Temp 98.0°F | Resp 18 | Ht 66.0 in | Wt 187.5 lb

## 2017-12-17 DIAGNOSIS — D509 Iron deficiency anemia, unspecified: Secondary | ICD-10-CM

## 2017-12-17 DIAGNOSIS — F17210 Nicotine dependence, cigarettes, uncomplicated: Secondary | ICD-10-CM

## 2017-12-17 DIAGNOSIS — E559 Vitamin D deficiency, unspecified: Secondary | ICD-10-CM

## 2017-12-17 DIAGNOSIS — H547 Unspecified visual loss: Secondary | ICD-10-CM

## 2017-12-17 DIAGNOSIS — E509 Vitamin A deficiency, unspecified: Secondary | ICD-10-CM

## 2017-12-17 DIAGNOSIS — N920 Excessive and frequent menstruation with regular cycle: Secondary | ICD-10-CM | POA: Diagnosis not present

## 2017-12-17 DIAGNOSIS — Z9884 Bariatric surgery status: Secondary | ICD-10-CM | POA: Diagnosis not present

## 2017-12-17 DIAGNOSIS — Z79899 Other long term (current) drug therapy: Secondary | ICD-10-CM | POA: Diagnosis not present

## 2017-12-17 DIAGNOSIS — D539 Nutritional anemia, unspecified: Secondary | ICD-10-CM | POA: Diagnosis not present

## 2017-12-17 LAB — IRON AND TIBC
Iron: 73 ug/dL (ref 41–142)
Saturation Ratios: 21 % (ref 21–57)
TIBC: 353 ug/dL (ref 236–444)
UIBC: 280 ug/dL

## 2017-12-17 LAB — CBC WITH DIFFERENTIAL (CANCER CENTER ONLY)
Basophils Absolute: 0 10*3/uL (ref 0.0–0.1)
Basophils Relative: 0 %
Eosinophils Absolute: 0.1 10*3/uL (ref 0.0–0.5)
Eosinophils Relative: 1 %
HCT: 35.6 % (ref 34.8–46.6)
Hemoglobin: 11.8 g/dL (ref 11.6–15.9)
Lymphocytes Relative: 13 %
Lymphs Abs: 1.4 10*3/uL (ref 0.9–3.3)
MCH: 32.4 pg (ref 25.1–34.0)
MCHC: 33.1 g/dL (ref 31.5–36.0)
MCV: 97.8 fL (ref 79.5–101.0)
Monocytes Absolute: 0.7 10*3/uL (ref 0.1–0.9)
Monocytes Relative: 7 %
Neutro Abs: 8.2 10*3/uL — ABNORMAL HIGH (ref 1.5–6.5)
Neutrophils Relative %: 79 %
Platelet Count: 230 10*3/uL (ref 145–400)
RBC: 3.64 MIL/uL — ABNORMAL LOW (ref 3.70–5.45)
RDW: 12.8 % (ref 11.2–14.5)
WBC Count: 10.5 10*3/uL — ABNORMAL HIGH (ref 3.9–10.3)

## 2017-12-17 LAB — FERRITIN: Ferritin: 29 ng/mL (ref 11–307)

## 2017-12-17 MED ORDER — SODIUM CHLORIDE 0.9 % IV SOLN
Freq: Once | INTRAVENOUS | Status: AC
  Administered 2017-12-17: 10:00:00 via INTRAVENOUS
  Filled 2017-12-17: qty 1000

## 2017-12-17 MED ORDER — SODIUM CHLORIDE 0.9 % IV SOLN
Freq: Once | INTRAVENOUS | Status: AC
  Administered 2017-12-17: 10:00:00 via INTRAVENOUS
  Filled 2017-12-17: qty 250

## 2017-12-17 MED ORDER — SODIUM CHLORIDE 0.9 % IV SOLN: 510.0000 mg | Freq: Once | INTRAVENOUS | Status: DC

## 2017-12-17 MED ORDER — SODIUM CHLORIDE 0.9 % IV SOLN: Freq: Once | INTRAVENOUS | Status: DC

## 2017-12-17 NOTE — Patient Instructions (Signed)

## 2017-12-17 NOTE — Telephone Encounter (Signed)
Patient scheduled per 8/21 sch message  °

## 2017-12-19 ENCOUNTER — Telehealth: Payer: Self-pay | Admitting: Hematology

## 2017-12-19 NOTE — Telephone Encounter (Signed)
Appts scheduled , patient notfied letter/calendar mailed per 8/21 los

## 2017-12-22 ENCOUNTER — Telehealth: Payer: Self-pay | Admitting: Hematology

## 2017-12-22 ENCOUNTER — Telehealth: Payer: Self-pay

## 2017-12-22 NOTE — Telephone Encounter (Signed)
Added iv iron per sch message per 8/26 - left message for patient with appt date and time.

## 2017-12-22 NOTE — Telephone Encounter (Signed)
-----   Message from Truitt Merle, MD sent at 12/21/2017 12:32 PM EDT ----- Please let pt know her lab results, iron level normal, but on low end of normal range, does not need iv iron now, but likely needs iv iron in 3 months, OK to set up iv iron on her next lab if she wants, thanks   Truitt Merle  12/21/2017

## 2017-12-22 NOTE — Telephone Encounter (Signed)
Per Dr. Burr Medico called patient with lab results.  She would like a iron infusion when she comes in for lab work on 11/21, scheduling message was sent.

## 2017-12-30 DIAGNOSIS — E509 Vitamin A deficiency, unspecified: Secondary | ICD-10-CM | POA: Diagnosis not present

## 2017-12-30 DIAGNOSIS — E559 Vitamin D deficiency, unspecified: Secondary | ICD-10-CM | POA: Diagnosis not present

## 2017-12-30 DIAGNOSIS — E611 Iron deficiency: Secondary | ICD-10-CM | POA: Diagnosis not present

## 2018-01-30 ENCOUNTER — Other Ambulatory Visit: Payer: Self-pay | Admitting: Family Medicine

## 2018-01-30 DIAGNOSIS — N6320 Unspecified lump in the left breast, unspecified quadrant: Secondary | ICD-10-CM

## 2018-02-09 ENCOUNTER — Other Ambulatory Visit: Payer: Self-pay | Admitting: Family Medicine

## 2018-02-09 ENCOUNTER — Ambulatory Visit
Admission: RE | Admit: 2018-02-09 | Discharge: 2018-02-09 | Disposition: A | Discharge status: Home / Self Care | Payer: Medicare HMO | Source: Ambulatory Visit | Attending: Family Medicine | Admitting: Family Medicine

## 2018-02-09 DIAGNOSIS — N6320 Unspecified lump in the left breast, unspecified quadrant: Secondary | ICD-10-CM

## 2018-02-09 DIAGNOSIS — R922 Inconclusive mammogram: Secondary | ICD-10-CM | POA: Diagnosis not present

## 2018-02-09 DIAGNOSIS — N6310 Unspecified lump in the right breast, unspecified quadrant: Secondary | ICD-10-CM

## 2018-02-09 DIAGNOSIS — Z853 Personal history of malignant neoplasm of breast: Secondary | ICD-10-CM | POA: Diagnosis not present

## 2018-02-09 DIAGNOSIS — N6312 Unspecified lump in the right breast, upper inner quadrant: Secondary | ICD-10-CM | POA: Diagnosis not present

## 2018-02-20 ENCOUNTER — Telehealth: Payer: Self-pay | Admitting: Hematology

## 2018-02-20 NOTE — Telephone Encounter (Signed)
Appts scheduled patient notified date/time per 10/24 sch msg

## 2018-03-02 ENCOUNTER — Encounter: Payer: Self-pay | Admitting: Nurse Practitioner

## 2018-03-02 ENCOUNTER — Inpatient Hospital Stay: Payer: Medicare HMO | Attending: Hematology

## 2018-03-02 ENCOUNTER — Inpatient Hospital Stay (HOSPITAL_BASED_OUTPATIENT_CLINIC_OR_DEPARTMENT_OTHER): Payer: Medicare HMO | Admitting: Nurse Practitioner

## 2018-03-02 ENCOUNTER — Telehealth: Payer: Self-pay

## 2018-03-02 VITALS — BP 101/84 | HR 77 | Temp 98.4°F | Resp 20 | Ht 66.0 in | Wt 194.5 lb

## 2018-03-02 DIAGNOSIS — E509 Vitamin A deficiency, unspecified: Secondary | ICD-10-CM

## 2018-03-02 DIAGNOSIS — N920 Excessive and frequent menstruation with regular cycle: Secondary | ICD-10-CM

## 2018-03-02 DIAGNOSIS — E559 Vitamin D deficiency, unspecified: Secondary | ICD-10-CM | POA: Diagnosis not present

## 2018-03-02 DIAGNOSIS — D649 Anemia, unspecified: Secondary | ICD-10-CM

## 2018-03-02 DIAGNOSIS — D509 Iron deficiency anemia, unspecified: Secondary | ICD-10-CM

## 2018-03-02 DIAGNOSIS — D500 Iron deficiency anemia secondary to blood loss (chronic): Secondary | ICD-10-CM

## 2018-03-02 LAB — CBC WITH DIFFERENTIAL (CANCER CENTER ONLY)
Abs Immature Granulocytes: 0.02 10*3/uL (ref 0.00–0.07)
Basophils Absolute: 0 10*3/uL (ref 0.0–0.1)
Basophils Relative: 0 %
Eosinophils Absolute: 0 10*3/uL (ref 0.0–0.5)
Eosinophils Relative: 1 %
HCT: 34.9 % — ABNORMAL LOW (ref 36.0–46.0)
Hemoglobin: 10.9 g/dL — ABNORMAL LOW (ref 12.0–15.0)
Immature Granulocytes: 0 %
Lymphocytes Relative: 16 %
Lymphs Abs: 1.3 10*3/uL (ref 0.7–4.0)
MCH: 31.4 pg (ref 26.0–34.0)
MCHC: 31.2 g/dL (ref 30.0–36.0)
MCV: 100.6 fL — ABNORMAL HIGH (ref 80.0–100.0)
Monocytes Absolute: 0.6 10*3/uL (ref 0.1–1.0)
Monocytes Relative: 7 %
Neutro Abs: 6 10*3/uL (ref 1.7–7.7)
Neutrophils Relative %: 76 %
Platelet Count: 184 10*3/uL (ref 150–400)
RBC: 3.47 MIL/uL — ABNORMAL LOW (ref 3.87–5.11)
RDW: 13.8 % (ref 11.5–15.5)
WBC Count: 7.9 10*3/uL (ref 4.0–10.5)
nRBC: 0 % (ref 0.0–0.2)

## 2018-03-02 LAB — FERRITIN: Ferritin: 10 ng/mL — ABNORMAL LOW (ref 11–307)

## 2018-03-02 LAB — IRON AND TIBC
Iron: 41 ug/dL (ref 41–142)
Saturation Ratios: 10 % — ABNORMAL LOW (ref 21–57)
TIBC: 399 ug/dL (ref 236–444)
UIBC: 358 ug/dL (ref 120–384)

## 2018-03-02 NOTE — Telephone Encounter (Signed)
Printed avs and calender of upcoming appointment. Per 1/14 los 

## 2018-03-02 NOTE — Progress Notes (Signed)
[facility]  [phone] [phone]  Clinic Follow up Note   Patient Care Team: Maurice Small, MD as PCP - General (Family Medicine) 03/02/2018  DIAGNOSIS: Vitamin D deficiency  Vitamin A deficiency  Iron deficiency anemia, unspecified iron deficiency anemia type  OTHER MEDICAL ISSUES: 1. History of pseudotumor cerebri which developed in 1994 attributed to a viral illness, at which time she also had Guillain-Barre.This resulted in blindness and also required a ventriculoperitoneal shunt which was removed. 2. History of morbid obesity and had attempted to have bariatric surgery in [place] by Dr. Lorin Picket. Medical records indicated she had a duodenal switch procedure done in 2007.  CURRENT THERAPY:  Feraheme , as needed, MVI and copper IV replacement as needed started in Aug 2016   INTERVAL HISTORY: Mackenzie [name] returns with her mother for follow up. She was last seen 11/2017. Since end of August to September she has been increasingly fatigued with intermittent dizzy spells. She saw another provider Dr. Justin Mend who recommended vitamin A and D shots and iron infusion, which has not been scheduled. She then doubled certain vitamins as instructed. She and her mother state she is very lethargic lately. Her activity tolerance is low, she is up 2 hours before needing to rest again. Dizzy spells occur randomly about once per week. She gets panic attacks when she feels dizzy. She craves ice. She denies bleeding other than monthly menstrual cycle which she just finished. She notes they are "always heavy" but this was lighter than usual, lasted 4 days. She has chronic diarrhea, has not had imodium lately. Appetite is "fine." denies n/v or unintentional weight loss. She noticed a lump on her left chest/upper breast and had it investigated. Breast mammogram and [place] in 02/09/18 indicating she has a benign lipoma on the left chest near clavicle and multiple fibroadenomas  throughout the breasts. Denies pain. No recent fever, chills, cough, chest pain, dyspnea, or palpitations.    MEDICAL HISTORY:  Past Medical History:  Diagnosis Date  . Anemia    history  . Blind 1994  . Blood transfusion 04/2010   cancer center at [facility] long - iron transfusion per pt  . Breast lump left  . Depression    no meds  . H/O hiatal hernia    repaired with weight loss surgery  . History of uterine fibroid   . Neuromuscular disorder ([place])    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  . Pseudotumor cerebri 1994   History - caused blind    SURGICAL HISTORY: Past Surgical History:  Procedure Laterality Date  . APPENDECTOMY     removed with switch surgery  . BREAST SURGERY  2012   right breast lumpectomy  . CHOLECYSTECTOMY     removed with switch surgery  . eye decompression x3  1994   Bilateral   . FETAL BLOOD TRANSFUSION  jan 29,2012  . lp shunt  1994   removed in 2009  . MYOMECTOMY    . rurod switch  2007   weight loss surgery done in [place] (duodenal switch)    I have reviewed the social history and family history with the patient and they are unchanged from previous note.  ALLERGIES:  is allergic to influenza vaccines.  MEDICATIONS:  Current Outpatient Medications  Medication Sig Dispense Refill  . Ascorbic Acid (VITAMIN C) 100 MG tablet Take by mouth. Not sure of dose    . Calcium Carbonate (CALTRATE 600 PO) Take 2 tablets by mouth 2 (two)  times daily.    . Cholecalciferol (VITAMIN D3 PO) Take 100,000 Units by mouth 2 (two) times daily.     . Cyanocobalamin (VITAMIN B-12 PO) Take 1 tablet by mouth daily.    Marland Kitchen FERROUS FUMARATE PO Take by mouth. Not sure of dose    . ibuprofen (ADVIL,MOTRIN) 800 MG tablet Take 1 tablet (800 mg total) by mouth every 8 (eight) hours as needed for cramping. 30 tablet 5  . medroxyPROGESTERone (PROVERA) 5 MG tablet     . Probiotic Product (PROBIOTIC PO) Take 1 capsule by mouth daily.    . vitamin A 25000 UNIT  capsule Take 50,000 Units by mouth daily.    . vitamin E 100 UNIT capsule Take by mouth. Not sure of dose    . prenatal vitamin w/FE, FA (PRENATAL 1 + 1) 27-1 MG TABS tablet Take 1 tablet by mouth daily at 12 noon.     No current facility-administered medications for this visit.     PHYSICAL EXAMINATION: ECOG PERFORMANCE STATUS: 1 - Symptomatic but completely ambulatory  Vitals:   03/02/18 0919  BP: 101/84  Pulse: 77  Resp: 20  Temp: 98.4 F (36.9 C)  SpO2: 100%   Filed Weights   03/02/18 0919  Weight: 194 lb 8 oz (88.2 kg)    GENERAL:alert, no distress and comfortable SKIN: no rashes or significant lesions EYES: sclera clear OROPHARYNX:no thrush or ulcers LYMPH:  no palpable cervical, supraclavicular, or axillary lymphadenopathy  CHEST: soft tissue mass to left chest under clavicle compatible with known lipoma   LUNGS: clear to auscultation with normal breathing effort HEART: regular rate & rhythm, no lower extremity edema ABDOMEN: abdomen soft, non-tender and normal bowel sounds Musculoskeletal:no cyanosis of digits and no clubbing  NEURO: alert & oriented x 3 with fluent speech, no focal motor/sensory deficits  LABORATORY DATA:  I have reviewed the data as listed CBC Latest Ref Rng & Units 03/02/2018 12/17/2017 08/21/2017  WBC 4.0 - 10.5 K/uL 7.9 10.5(H) 10.1  Hemoglobin 12.0 - 15.0 g/dL 10.9(L) 11.8 12.2  Hematocrit 36.0 - 46.0 % 34.9(L) 35.6 37.7  Platelets 150 - 400 K/uL 184 230 197     CMP Latest Ref Rng & Units 03/08/2016 09/08/2015 06/26/2015  Glucose 70 - 140 mg/dl 78 75 80  BUN 7.0 - 26.0 mg/dL 15.5 15.5 20.5  Creatinine 0.6 - 1.1 mg/dL 0.7 0.7 0.8  Sodium 136 - 145 mEq/L 137 140 138  Potassium 3.5 - 5.1 mEq/L 4.0 3.9 4.1  Chloride 96 - 112 mEq/L - - -  CO2 22 - 29 mEq/L 24 25 24   Calcium 8.4 - 10.4 mg/dL 9.0 9.1 8.8  Total Protein 6.4 - 8.3 g/dL 7.6 7.4 7.7  Total Bilirubin 0.20 - 1.20 mg/dL 1.05 0.63 0.97  Alkaline Phos 40 - 150 U/L 68 51 45  AST 5 -  34 U/L 28 19 27   ALT 0 - 55 U/L 25 17 19       RADIOGRAPHIC STUDIES: I have personally reviewed the radiological images as listed and agreed with the findings in the report. No results found.   ASSESSMENT & PLAN: Mackenzie [name] 42 y.o. female with a history of iron deficient anemia and multiple nutritional deficiency due to her prior duodenal switch procedure in 2007.  1. Nutritional anemia. -she was diagnosed with iron deficient anemia secondary to her duodenal switch procedure, and heavy menstrual period. She responded well to IV iron. -she has macrocytic anemia, A76 and folic acid level were previously normal -  her lab work also showed low Cooper and zinc level, which may also contribute to her anemia -Her anemia did not correct completely by IV iron. She likely has as a component of other nutritional deficient anemia, possible secondary to her duodenal switch procedure -she has been receiving copper and zinc replacement since 11/2014, her anemia did not resolve but clinically she feels better after infusion  -last IV Feraheme 03/2017, her iron studies improved but are low again. Today's ferritin is 10, serum iron 41. She takes 1 iron tab daily. Hgb down to 10.9. Given her fatigue, pica, and dizziness, will treat her iron deficiency anemia with IV Feraheme weekly x2, starting this week.  -her other vitamin studies are pending, but anticipate she will need multivitamin infusion this week as well. She continues oral vitamins and minerals.  -will see her back with lab in 3 months     2.  Other vitamin deficiency -She has received 600K U Vit D and 50K U VitA at Dr. Lucinda Dell office in 2016, now on high oral dose  -she received multivitamin 25ml (containing vitamin 400 unit, vitamin A 6600 unit) intravenous infusion in 11/2014, and did feel much better after infusion.  -last MVI infusion 12/17/17, anticipate she will need this with IV Feraheme this week   3. Hypocalcemia -  hypocalcemia possibly related to her vitamin D deficiency, she'll continue oral calcium and vitamin D. -no recent calcium level for comparison   4. Menorrhagia -previous IUD removed, currently on medroxyprogesterone; menses have been lighter lately   She will continue to follow-up with her primary care physician for  other medical issues.  Plan -CBC and iron studies reviewed, vitamins pending -IV Feraheme weekly x2 and MVI injection starting this week -We will cancel her lab and infusion 11/21 -She will return for lab, f/u with Dr. Burr Medico in 3 months with supportive infusions if needed   All questions were answered. The patient knows to call the clinic with any problems, questions or concerns. No barriers to learning was detected.     Alla Feeling, NP 03/02/18

## 2018-03-03 LAB — VITAMIN D 25 HYDROXY (VIT D DEFICIENCY, FRACTURES): Vit D, 25-Hydroxy: 18.3 ng/mL — ABNORMAL LOW (ref 30.0–100.0)

## 2018-03-04 ENCOUNTER — Ambulatory Visit (HOSPITAL_COMMUNITY)
Admission: RE | Admit: 2018-03-04 | Discharge: 2018-03-04 | Disposition: A | Discharge status: Home / Self Care | Payer: Medicare HMO | Source: Ambulatory Visit | Attending: Nurse Practitioner | Admitting: Nurse Practitioner

## 2018-03-04 ENCOUNTER — Telehealth: Payer: Self-pay

## 2018-03-04 DIAGNOSIS — D509 Iron deficiency anemia, unspecified: Secondary | ICD-10-CM | POA: Insufficient documentation

## 2018-03-04 LAB — COPPER, SERUM: Copper: 73 ug/dL (ref 72–166)

## 2018-03-04 LAB — ZINC: Zinc: 63 ug/dL (ref 56–134)

## 2018-03-04 LAB — VITAMIN A: Vitamin A (Retinoic Acid): 37.4 ug/dL (ref 20.1–62.0)

## 2018-03-04 LAB — VITAMIN C: Vitamin C: 0.7 mg/dL (ref 0.2–2.0)

## 2018-03-04 MED ORDER — SODIUM CHLORIDE 0.9 % IV SOLN
510.0000 mg | Freq: Once | INTRAVENOUS | Status: AC
  Administered 2018-03-04: 510 mg via INTRAVENOUS
  Filled 2018-03-04: qty 17

## 2018-03-04 MED ORDER — SODIUM CHLORIDE 0.9 % IV SOLN
INTRAVENOUS | Status: DC | PRN
  Administered 2018-03-04: 250 mL via INTRAVENOUS

## 2018-03-04 NOTE — Progress Notes (Signed)
PATIENT CARE CENTER NOTE  Diagnosis: Iron Deficiency Anemia    Provider: Dr. Burr Medico   Procedure: IV Feraheme    Note: Patient received Feraheme infusion. Monitored patient for 30 minutes post-infusion. Tolerated infusion well with no adverse reaction. Discharge instructions given. Patient alert, oriented and ambulatory at discharge.

## 2018-03-04 NOTE — Discharge Instructions (Signed)

## 2018-03-04 NOTE — Telephone Encounter (Signed)
Left voice message for patient regarding blood work results.  Per Dr. Burr Medico informed her iron and Vitamin D levels are low.  Dr. Burr Medico is getting her set up for IV iron and IV multivitamin infusion.  Scheduling should be calling you.

## 2018-03-04 NOTE — Telephone Encounter (Signed)
-----   Message from Truitt Merle, MD sent at 03/03/2018  7:50 AM EST ----- Please let pt know her iv iron level and Vitd level are low, I will set up iv iron and MVI infusion for her, thanks   Truitt Merle  03/03/2018

## 2018-03-10 ENCOUNTER — Telehealth: Payer: Self-pay

## 2018-03-10 NOTE — Telephone Encounter (Signed)
-----   Message from Truitt Merle, MD sent at 03/07/2018  5:10 PM EST ----- Please let pt know the lab results, all WNL, no concerns, thanks   Truitt Merle  03/07/2018

## 2018-03-10 NOTE — Telephone Encounter (Signed)
Per Dr. Burr Medico left patient voice message lab results are all normal, no concerns.

## 2018-03-11 ENCOUNTER — Ambulatory Visit (HOSPITAL_COMMUNITY)
Admission: RE | Admit: 2018-03-11 | Discharge: 2018-03-11 | Disposition: A | Discharge status: Home / Self Care | Payer: Medicare HMO | Source: Ambulatory Visit | Attending: Nurse Practitioner | Admitting: Nurse Practitioner

## 2018-03-11 DIAGNOSIS — D509 Iron deficiency anemia, unspecified: Secondary | ICD-10-CM | POA: Diagnosis not present

## 2018-03-11 MED ORDER — SODIUM CHLORIDE 0.9 % IV SOLN
INTRAVENOUS | Status: DC | PRN
  Administered 2018-03-11: 250 mL via INTRAVENOUS

## 2018-03-11 MED ORDER — SODIUM CHLORIDE 0.9 % IV SOLN
510.0000 mg | Freq: Once | INTRAVENOUS | Status: AC
  Administered 2018-03-11: 510 mg via INTRAVENOUS
  Filled 2018-03-11: qty 17

## 2018-03-11 NOTE — Progress Notes (Signed)
[facility]   Diagnosis:  Iron deficiency anemia  Provider: Dr. Burr Medico  Procedure: IV Feraheme  Note:  Patirnt received Feraheme infusion. Vital signs taken post infusion. Patient tolerated the procedure, no adverse reaction. Discharge instructions provided. Patient alert and oriented and ambulatory ar d/c.

## 2018-03-11 NOTE — Discharge Instructions (Signed)

## 2018-03-19 ENCOUNTER — Other Ambulatory Visit: Payer: Medicare HMO

## 2018-03-19 ENCOUNTER — Ambulatory Visit: Payer: Medicare HMO

## 2018-04-15 DIAGNOSIS — N3281 Overactive bladder: Secondary | ICD-10-CM | POA: Diagnosis not present

## 2018-04-15 DIAGNOSIS — R159 Full incontinence of feces: Secondary | ICD-10-CM | POA: Diagnosis not present

## 2018-04-15 DIAGNOSIS — R152 Fecal urgency: Secondary | ICD-10-CM | POA: Diagnosis not present

## 2018-04-15 DIAGNOSIS — N920 Excessive and frequent menstruation with regular cycle: Secondary | ICD-10-CM | POA: Diagnosis not present

## 2018-06-01 ENCOUNTER — Other Ambulatory Visit: Payer: Medicare HMO

## 2018-06-01 ENCOUNTER — Ambulatory Visit: Payer: Medicare HMO

## 2018-06-01 ENCOUNTER — Ambulatory Visit: Payer: Medicare HMO | Admitting: Hematology

## 2018-06-08 ENCOUNTER — Telehealth: Payer: Self-pay | Admitting: Hematology

## 2018-06-08 NOTE — Telephone Encounter (Signed)
Scheduled appt per sch message - left message for patient with appt date and time   

## 2018-06-12 ENCOUNTER — Telehealth: Payer: Self-pay

## 2018-06-12 ENCOUNTER — Other Ambulatory Visit: Payer: Medicare HMO

## 2018-06-12 ENCOUNTER — Inpatient Hospital Stay: Payer: Medicare HMO | Attending: Hematology

## 2018-06-12 DIAGNOSIS — D509 Iron deficiency anemia, unspecified: Secondary | ICD-10-CM | POA: Insufficient documentation

## 2018-06-12 DIAGNOSIS — Z79899 Other long term (current) drug therapy: Secondary | ICD-10-CM | POA: Insufficient documentation

## 2018-06-12 DIAGNOSIS — N920 Excessive and frequent menstruation with regular cycle: Secondary | ICD-10-CM | POA: Insufficient documentation

## 2018-06-12 DIAGNOSIS — E569 Vitamin deficiency, unspecified: Secondary | ICD-10-CM | POA: Insufficient documentation

## 2018-06-12 DIAGNOSIS — R252 Cramp and spasm: Secondary | ICD-10-CM | POA: Insufficient documentation

## 2018-06-12 DIAGNOSIS — D539 Nutritional anemia, unspecified: Secondary | ICD-10-CM | POA: Insufficient documentation

## 2018-06-12 DIAGNOSIS — Z791 Long term (current) use of non-steroidal anti-inflammatories (NSAID): Secondary | ICD-10-CM | POA: Insufficient documentation

## 2018-06-12 DIAGNOSIS — Z793 Long term (current) use of hormonal contraceptives: Secondary | ICD-10-CM | POA: Insufficient documentation

## 2018-06-12 DIAGNOSIS — R5383 Other fatigue: Secondary | ICD-10-CM | POA: Insufficient documentation

## 2018-06-12 NOTE — Telephone Encounter (Signed)
Patient not seen for her appointment today, called her mobile and she answered responding that she forgot and would like appointment rescheduled.

## 2018-06-17 NOTE — Progress Notes (Signed)
[place]   [phone]) [phone] [phone]   Clinic Follow up Note   Patient Care Team: Maurice Small, MD as PCP - General (Family Medicine)  Date of Service:  06/19/2018  CHIEF COMPLAINT: F/u of anemia and Vitamin deficiencies   CURRENT THERAPY:  Feraheme, as needed, MVI and copper IV replacement as needed started in Aug 2016   INTERVAL HISTORY:  Mackenzie [name] is here for a follow up of her anemia and vitamin deficiencies. She was last seen by me 6 months ago. She presents to the clinic today by herself today. She notes is doing well but fatigued. Her energy level has been fluctuating. After last VI Iron she felt better. She notes she feels better after her MVI as well. She would prefer Thursday appointments.    REVIEW OF SYSTEMS:   Constitutional: Denies fevers, chills or abnormal weight loss (+) Fatigue  Eyes: Denies blurriness of vision Ears, nose, mouth, throat, and face: Denies mucositis or sore throat Respiratory: Denies cough, dyspnea or wheezes Cardiovascular: Denies palpitation, chest discomfort or lower extremity swelling Gastrointestinal:  Denies nausea, heartburn or change in bowel habits Skin: Denies abnormal skin rashes Lymphatics: Denies new lymphadenopathy or easy bruising Neurological:Denies numbness, tingling or new weaknesses Behavioral/Psych: Mood is stable, no new changes  All other systems were reviewed with the patient and are negative.  MEDICAL HISTORY:  Past Medical History:  Diagnosis Date  . Anemia    history  . Blind 1994  . Blood transfusion 04/2010   cancer center at [facility] long - iron transfusion per pt  . Breast lump left  . Depression    no meds  . H/O hiatal hernia    repaired with weight loss surgery  . History of uterine fibroid   . 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 Pseudotumor cerebri 1994   History - caused blind    SURGICAL HISTORY: Past Surgical  History:  Procedure Laterality Date  . APPENDECTOMY     removed with switch surgery  . BREAST SURGERY  2012   right breast lumpectomy  . CHOLECYSTECTOMY     removed with switch surgery  . eye decompression x3  1994   Bilateral   . FETAL BLOOD TRANSFUSION  jan 29,2012  . lp shunt  1994   removed in 2009  . MYOMECTOMY    . rurod switch  2007   weight loss surgery done in [place] (duodenal switch)    I have reviewed the social history and family history with the patient and they are unchanged from previous note.  ALLERGIES:  is allergic to influenza vaccines.  MEDICATIONS:  Current Outpatient Medications  Medication Sig Dispense Refill  . Ascorbic Acid (VITAMIN C) 100 MG tablet Take by mouth. Not sure of dose    . Calcium Carbonate (CALTRATE 600 PO) Take 2 tablets by mouth 2 (two) times daily.    . Cholecalciferol (VITAMIN D3 PO) Take 100,000 Units by mouth 2 (two) times daily.     . Cyanocobalamin (VITAMIN B-12 PO) Take 1 tablet by mouth daily.    Marland Kitchen FERROUS FUMARATE PO Take by mouth. Not sure of dose    . ibuprofen (ADVIL,MOTRIN) 800 MG tablet Take 1 tablet (800 mg total) by mouth every 8 (eight) hours as needed for cramping. 30 tablet 5  . medroxyPROGESTERone (PROVERA) 5 MG tablet     . prenatal vitamin w/FE, FA (PRENATAL 1 + 1) 27-1 MG TABS  tablet Take 1 tablet by mouth daily at 12 noon.    . Probiotic Product (PROBIOTIC PO) Take 1 capsule by mouth daily.    . vitamin A 25000 UNIT capsule Take 50,000 Units by mouth daily.    . vitamin E 100 UNIT capsule Take by mouth. Not sure of dose     No current facility-administered medications for this visit.    Facility-Administered Medications Ordered in Other Visits  Medication Dose Route Frequency Provider Last Rate Last Dose  . ferumoxytol (FERAHEME) 510 mg in sodium chloride 0.9 % 100 mL IVPB  510 mg Intravenous Once Truitt Merle, MD      . sodium chloride 0.9 % 1,000 mL with multivitamins adult (INFUVITE ADULT) 10 mL infusion    Intravenous Once Truitt Merle, MD        PHYSICAL EXAMINATION: ECOG PERFORMANCE STATUS: 1 - Symptomatic but completely ambulatory  Vitals with BMI 06/19/2018  Height 5'6"  Weight 194lbs 8 oz  BMI 24.40  Systolic 102  Diastolic 72  Pulse 78  Respirations 18    GENERAL:alert, no distress and comfortable SKIN: skin color, texture, turgor are normal, no rashes or significant lesions EYES: normal, Conjunctiva are pink and non-injected, sclera clear OROPHARYNX:no exudate, no erythema and lips, buccal mucosa, and tongue normal  NECK: supple, thyroid normal size, non-tender, without nodularity LYMPH:  no palpable lymphadenopathy in the cervical, axillary or inguinal LUNGS: clear to auscultation and percussion with normal breathing effort HEART: regular rate & rhythm and no murmurs and no lower extremity edema ABDOMEN:abdomen soft, non-tender and normal bowel sounds Musculoskeletal:no cyanosis of digits and no clubbing  NEURO: alert & oriented x 3 with fluent speech, no focal motor/sensory deficits  LABORATORY DATA:  I have reviewed the data as listed CBC Latest Ref Rng & Units 06/19/2018 03/02/2018 12/17/2017  WBC 4.0 - 10.5 K/uL 8.5 7.9 10.5(H)  Hemoglobin 12.0 - 15.0 g/dL 11.5(L) 10.9(L) 11.8  Hematocrit 36.0 - 46.0 % 35.9(L) 34.9(L) 35.6  Platelets 150 - 400 K/uL 195 184 230     CMP Latest Ref Rng & Units 03/08/2016 09/08/2015 06/26/2015  Glucose 70 - 140 mg/dl 78 75 80  BUN 7.0 - 26.0 mg/dL 15.5 15.5 20.5  Creatinine 0.6 - 1.1 mg/dL 0.7 0.7 0.8  Sodium 136 - 145 mEq/L 137 140 138  Potassium 3.5 - 5.1 mEq/L 4.0 3.9 4.1  Chloride 96 - 112 mEq/L - - -  CO2 22 - 29 mEq/L 24 25 24   Calcium 8.4 - 10.4 mg/dL 9.0 9.1 8.8  Total Protein 6.4 - 8.3 g/dL 7.6 7.4 7.7  Total Bilirubin 0.20 - 1.20 mg/dL 1.05 0.63 0.97  Alkaline Phos 40 - 150 U/L 68 51 45  AST 5 - 34 U/L 28 19 27   ALT 0 - 55 U/L 25 17 19       RADIOGRAPHIC STUDIES: I have personally reviewed the radiological images as  listed and agreed with the findings in the report. No results found.   ASSESSMENT & PLAN:  Mackenzie [name] is a 43 y.o. female with   1. Nutritional anemia. - she was diagnosed with iron deficient anemia secondary to her duodenal switch procedure, and heavy menstrual period. She responded well to IV iron. - she now has macrocytic anemia,  but the V25 and folic acid level are normal -her lab work also showed low Cooper and zinc level, which may also contribute to her anemia -Her anemia did not correct completely by IV iron. She likely has as a  component of other nutritional deficient anemia, possible secondary to her duodenal switch procedure -she has been receiving copper and zinc replacement since 11/2014, her anemia did not improve much but she felt much better after infusion -Continue taking multivitamin with minerals, which contains Copper about 1-3mg  a day, and zinc 100 mg per day -she will also continue oral ferrous sulfate by mouth 2 tablets a day -Labs reviewed, CBC shows improved anemia with hg at 11.5. Iron panel is still pending.  -She is clinically stable and tolerating treatment well. She has been needing IV Feraheme 1-2 times a year.  -F/u every 9 months    2. Other vitamin deficiency  -She has received 600K U Vit D and 50K U VitA at Dr. Lucinda Dell office in 2016, now on high oral dose  -She received multivitamin 14ml (containing vitamin 400 unit, vitamin A 6600 unit) intravenous infusion in 04/2014, 11/2014 and last on 12/17/17, about every 3 months. Responded well.  -Will monitor with labs every 3 months per patient request. If levels low will give multivitamin infusion.  -Last vitamin, copper and zinc levels were WNL, Vitamin D was down at 18.3 (02/2018). Labs today result is still pending. Will proceed with MVI today, No IV copper needed today  -repeat every 3 months   3. Hypocalcemia -Hypocalcemia could be related to her vitamin D deficiency -Currently leg cramps  has improved -she'll continue oral calcium and vitamin D.   4. Menorrhagia -The IUD was taken out in 10/2017, she has since began having heavy periods again -she is on OCT now, her period has been lighter now   She will continue to follow-up with her primary care physician for  other medical issues.  Plan -Labs reviewed, anemia  Improved, no IV iron today, will proceed with MVI today without copper -continue lab every 3 months on Mondays, and IV MVI and or iv iron 3 days after lab (if needed, depends on levels) -f/u in 9 months      No problem-specific Assessment & Plan notes found for this encounter.   No orders of the defined types were placed in this encounter.  All questions were answered. The patient knows to call the clinic with any problems, questions or concerns. No barriers to learning was detected. I spent 10 minutes counseling the patient face to face. The total time spent in the appointment was 15 minutes and more than 50% was on counseling and review of test results     Truitt Merle, MD 06/19/2018   I, Joslyn Devon, am acting as scribe for Truitt Merle, MD.   I have reviewed the above documentation for accuracy and completeness, and I agree with the above.

## 2018-06-19 ENCOUNTER — Encounter: Payer: Self-pay | Admitting: Hematology

## 2018-06-19 ENCOUNTER — Inpatient Hospital Stay (HOSPITAL_BASED_OUTPATIENT_CLINIC_OR_DEPARTMENT_OTHER): Payer: Medicare HMO | Admitting: Hematology

## 2018-06-19 ENCOUNTER — Inpatient Hospital Stay: Payer: Medicare HMO

## 2018-06-19 ENCOUNTER — Telehealth: Payer: Self-pay | Admitting: Hematology

## 2018-06-19 VITALS — BP 127/72 | HR 78 | Temp 98.6°F | Resp 18

## 2018-06-19 DIAGNOSIS — E509 Vitamin A deficiency, unspecified: Secondary | ICD-10-CM

## 2018-06-19 DIAGNOSIS — E559 Vitamin D deficiency, unspecified: Secondary | ICD-10-CM

## 2018-06-19 DIAGNOSIS — N920 Excessive and frequent menstruation with regular cycle: Secondary | ICD-10-CM | POA: Diagnosis not present

## 2018-06-19 DIAGNOSIS — D539 Nutritional anemia, unspecified: Secondary | ICD-10-CM

## 2018-06-19 DIAGNOSIS — R252 Cramp and spasm: Secondary | ICD-10-CM

## 2018-06-19 DIAGNOSIS — D509 Iron deficiency anemia, unspecified: Secondary | ICD-10-CM

## 2018-06-19 DIAGNOSIS — Z793 Long term (current) use of hormonal contraceptives: Secondary | ICD-10-CM

## 2018-06-19 DIAGNOSIS — E569 Vitamin deficiency, unspecified: Secondary | ICD-10-CM

## 2018-06-19 DIAGNOSIS — R5383 Other fatigue: Secondary | ICD-10-CM

## 2018-06-19 DIAGNOSIS — Z79899 Other long term (current) drug therapy: Secondary | ICD-10-CM

## 2018-06-19 DIAGNOSIS — Z791 Long term (current) use of non-steroidal anti-inflammatories (NSAID): Secondary | ICD-10-CM

## 2018-06-19 DIAGNOSIS — D649 Anemia, unspecified: Secondary | ICD-10-CM

## 2018-06-19 LAB — CBC WITH DIFFERENTIAL (CANCER CENTER ONLY)
Abs Immature Granulocytes: 0.01 10*3/uL (ref 0.00–0.07)
Basophils Absolute: 0 10*3/uL (ref 0.0–0.1)
Basophils Relative: 0 %
Eosinophils Absolute: 0.1 10*3/uL (ref 0.0–0.5)
Eosinophils Relative: 1 %
HCT: 35.9 % — ABNORMAL LOW (ref 36.0–46.0)
Hemoglobin: 11.5 g/dL — ABNORMAL LOW (ref 12.0–15.0)
Immature Granulocytes: 0 %
Lymphocytes Relative: 18 %
Lymphs Abs: 1.6 10*3/uL (ref 0.7–4.0)
MCH: 32.2 pg (ref 26.0–34.0)
MCHC: 32 g/dL (ref 30.0–36.0)
MCV: 100.6 fL — ABNORMAL HIGH (ref 80.0–100.0)
Monocytes Absolute: 0.7 10*3/uL (ref 0.1–1.0)
Monocytes Relative: 8 %
Neutro Abs: 6.2 10*3/uL (ref 1.7–7.7)
Neutrophils Relative %: 73 %
Platelet Count: 195 10*3/uL (ref 150–400)
RBC: 3.57 MIL/uL — ABNORMAL LOW (ref 3.87–5.11)
RDW: 12.3 % (ref 11.5–15.5)
WBC Count: 8.5 10*3/uL (ref 4.0–10.5)
nRBC: 0 % (ref 0.0–0.2)

## 2018-06-19 LAB — IRON AND TIBC
Iron: 67 ug/dL (ref 41–142)
Saturation Ratios: 20 % — ABNORMAL LOW (ref 21–57)
TIBC: 330 ug/dL (ref 236–444)
UIBC: 264 ug/dL (ref 120–384)

## 2018-06-19 LAB — FERRITIN: Ferritin: 55 ng/mL (ref 11–307)

## 2018-06-19 MED ORDER — SODIUM CHLORIDE 0.9 % IV SOLN
510.0000 mg | Freq: Once | INTRAVENOUS | Status: DC
  Filled 2018-06-19: qty 17

## 2018-06-19 MED ORDER — SODIUM CHLORIDE 0.9 % IV SOLN
Freq: Once | INTRAVENOUS | Status: AC
  Administered 2018-06-19: 11:00:00 via INTRAVENOUS
  Filled 2018-06-19: qty 1000

## 2018-06-19 NOTE — Patient Instructions (Addendum)
Vitamin D Deficiency  Vitamin D deficiency is when your body does not have enough vitamin D. Vitamin D is important because:   It helps your body use other minerals that your body needs.   It helps keep your bones strong and healthy.   It may help to prevent some diseases.   It helps your heart and other muscles work well.  You can get vitamin D by:   Eating foods with vitamin D in them.   Drinking or eating milk or other foods that have had vitamin D added to them.   Taking a vitamin D supplement.   Being in the sun.  Not getting enough vitamin D can make your bones become soft. It can also cause other health problems.  Follow these instructions at home:   Take medicines and supplements only as told by your doctor.   Eat foods that have vitamin D. These include:  ? Dairy products, cereals, or juices with added vitamin D. Check the label for vitamin D.  ? Fatty fish like salmon or trout.  ? Eggs.  ? Oysters.   Do not use tanning beds.   Stay at a healthy weight. Lose weight, if needed.   Keep all follow-up visits as told by your doctor. This is important.  Contact a doctor if:   Your symptoms do not go away.   You feel sick to your stomach (nauseous).   Youthrow up (vomit).   You poop less often than usual or you have trouble pooping (constipation).  This information is not intended to replace advice given to you by your health care provider. Make sure you discuss any questions you have with your health care provider.  Document Released: 04/04/2011 Document Revised: 09/21/2015 Document Reviewed: 08/31/2014  Elsevier Interactive Patient Education  2019 Elsevier Inc[employer]

## 2018-06-19 NOTE — Telephone Encounter (Signed)
Called and scheduled appt per 2/21 los.  Patient aware of appt dates and time.

## 2018-06-20 LAB — VITAMIN D 25 HYDROXY (VIT D DEFICIENCY, FRACTURES): Vit D, 25-Hydroxy: 20.5 ng/mL — ABNORMAL LOW (ref 30.0–100.0)

## 2018-06-23 ENCOUNTER — Telehealth: Payer: Self-pay

## 2018-06-23 LAB — ZINC: Zinc: 80 ug/dL (ref 56–134)

## 2018-06-23 LAB — COPPER, SERUM: Copper: 65 ug/dL — ABNORMAL LOW (ref 72–166)

## 2018-06-23 LAB — VITAMIN C: Vitamin C: 0.9 mg/dL (ref 0.2–2.0)

## 2018-06-23 NOTE — Telephone Encounter (Signed)
Spoke with patient regarding lab results, per Dr. Burr Medico iron level was good last week, no need for IV iron for now, patient verbalized an understanding.

## 2018-06-23 NOTE — Telephone Encounter (Signed)
-----   Message from Truitt Merle, MD sent at 06/22/2018  8:34 PM EST ----- Please let pt know her iron level was good last week, no need iv iron for now, thanks   Truitt Merle  06/22/2018

## 2018-06-24 LAB — VITAMIN A: Vitamin A (Retinoic Acid): 32.1 ug/dL (ref 20.1–62.0)

## 2018-07-06 ENCOUNTER — Telehealth: Payer: Self-pay

## 2018-07-06 NOTE — Telephone Encounter (Signed)
-----   Message from Truitt Merle, MD sent at 07/03/2018  7:02 PM EST ----- Please let pt know that her copper level was slightly low, will give copper infusion next time she comes in on 5/29, or come in next week, please let pharmacy know, thanks much  Truitt Merle  07/03/2018

## 2018-07-06 NOTE — Telephone Encounter (Signed)
Left voice message for patient regarding her recent lab results.  Per Dr. Burr Medico copper level is slightly low, will give copper infusion next time she comes in on 5/29, or if she desires to get it sooner I have requested she call me back.

## 2018-09-07 DIAGNOSIS — R05 Cough: Secondary | ICD-10-CM | POA: Diagnosis not present

## 2018-09-08 DIAGNOSIS — R05 Cough: Secondary | ICD-10-CM | POA: Diagnosis not present

## 2018-09-08 DIAGNOSIS — J029 Acute pharyngitis, unspecified: Secondary | ICD-10-CM | POA: Diagnosis not present

## 2018-09-22 ENCOUNTER — Inpatient Hospital Stay: Payer: Medicare HMO | Attending: Hematology

## 2018-09-22 ENCOUNTER — Other Ambulatory Visit: Payer: Self-pay

## 2018-09-22 DIAGNOSIS — N920 Excessive and frequent menstruation with regular cycle: Secondary | ICD-10-CM | POA: Insufficient documentation

## 2018-09-22 DIAGNOSIS — D509 Iron deficiency anemia, unspecified: Secondary | ICD-10-CM | POA: Diagnosis not present

## 2018-09-22 DIAGNOSIS — E509 Vitamin A deficiency, unspecified: Secondary | ICD-10-CM | POA: Diagnosis not present

## 2018-09-22 DIAGNOSIS — E559 Vitamin D deficiency, unspecified: Secondary | ICD-10-CM | POA: Diagnosis not present

## 2018-09-22 DIAGNOSIS — D649 Anemia, unspecified: Secondary | ICD-10-CM

## 2018-09-22 LAB — CBC WITH DIFFERENTIAL (CANCER CENTER ONLY)
Abs Immature Granulocytes: 0.02 10*3/uL (ref 0.00–0.07)
Basophils Absolute: 0 10*3/uL (ref 0.0–0.1)
Basophils Relative: 0 %
Eosinophils Absolute: 0.1 10*3/uL (ref 0.0–0.5)
Eosinophils Relative: 1 %
HCT: 36 % (ref 36.0–46.0)
Hemoglobin: 11.4 g/dL — ABNORMAL LOW (ref 12.0–15.0)
Immature Granulocytes: 0 %
Lymphocytes Relative: 19 %
Lymphs Abs: 1.7 10*3/uL (ref 0.7–4.0)
MCH: 31.7 pg (ref 26.0–34.0)
MCHC: 31.7 g/dL (ref 30.0–36.0)
MCV: 100 fL (ref 80.0–100.0)
Monocytes Absolute: 0.6 10*3/uL (ref 0.1–1.0)
Monocytes Relative: 6 %
Neutro Abs: 6.7 10*3/uL (ref 1.7–7.7)
Neutrophils Relative %: 74 %
Platelet Count: 243 10*3/uL (ref 150–400)
RBC: 3.6 MIL/uL — ABNORMAL LOW (ref 3.87–5.11)
RDW: 13.4 % (ref 11.5–15.5)
WBC Count: 9 10*3/uL (ref 4.0–10.5)
nRBC: 0 % (ref 0.0–0.2)

## 2018-09-22 LAB — IRON AND TIBC
Iron: 68 ug/dL (ref 41–142)
Saturation Ratios: 18 % — ABNORMAL LOW (ref 21–57)
TIBC: 373 ug/dL (ref 236–444)
UIBC: 304 ug/dL (ref 120–384)

## 2018-09-22 LAB — FERRITIN: Ferritin: 17 ng/mL (ref 11–307)

## 2018-09-23 ENCOUNTER — Telehealth: Payer: Self-pay

## 2018-09-23 LAB — VITAMIN D 25 HYDROXY (VIT D DEFICIENCY, FRACTURES): Vit D, 25-Hydroxy: 16.1 ng/mL — ABNORMAL LOW (ref 30.0–100.0)

## 2018-09-23 NOTE — Telephone Encounter (Signed)
Left voice message for this patient regarding lab results, per Dr. Burr Medico iron level and vitamin D levels are low, Dr. Burr Medico would like her to get IV iron and MVI infusion next week.  I have sent a scheduling message and the schedulers will be calling her.  Encouraged her to call back if she has any questions.

## 2018-09-23 NOTE — Telephone Encounter (Signed)
-----   Message from Truitt Merle, MD sent at 09/23/2018  7:30 AM EDT ----- Please let pt know her iron level and vitD level is low, and schedule her iv iron and MVI infusion next week, thanks   Truitt Merle  09/23/2018

## 2018-09-24 ENCOUNTER — Telehealth: Payer: Self-pay | Admitting: Hematology

## 2018-09-24 LAB — VITAMIN A: Vitamin A (Retinoic Acid): 38.9 ug/dL (ref 20.1–62.0)

## 2018-09-24 LAB — ZINC: Zinc: 60 ug/dL (ref 56–134)

## 2018-09-24 LAB — VITAMIN C: Vitamin C: 1 mg/dL (ref 0.4–2.0)

## 2018-09-24 LAB — COPPER, SERUM: Copper: 66 ug/dL — ABNORMAL LOW (ref 72–166)

## 2018-09-24 NOTE — Telephone Encounter (Signed)
Scheduled appt per sch msg. Called patient, no answer.

## 2018-09-25 ENCOUNTER — Telehealth: Payer: Self-pay | Admitting: Hematology

## 2018-09-25 ENCOUNTER — Inpatient Hospital Stay: Payer: Medicare HMO

## 2018-09-25 NOTE — Telephone Encounter (Signed)
Called patient per 5/29 sch message - unable to reach patient - left message for patient to call back to reschedule appt

## 2018-10-02 ENCOUNTER — Other Ambulatory Visit: Payer: Self-pay

## 2018-10-02 ENCOUNTER — Inpatient Hospital Stay: Payer: Medicare HMO | Attending: Hematology

## 2018-10-02 VITALS — BP 110/54 | HR 75 | Temp 98.5°F | Resp 18

## 2018-10-02 DIAGNOSIS — D509 Iron deficiency anemia, unspecified: Secondary | ICD-10-CM | POA: Diagnosis not present

## 2018-10-02 DIAGNOSIS — D539 Nutritional anemia, unspecified: Secondary | ICD-10-CM | POA: Insufficient documentation

## 2018-10-02 DIAGNOSIS — E569 Vitamin deficiency, unspecified: Secondary | ICD-10-CM | POA: Diagnosis not present

## 2018-10-02 MED ORDER — SODIUM CHLORIDE 0.9 % IV SOLN
510.0000 mg | Freq: Once | INTRAVENOUS | Status: AC
  Administered 2018-10-02: 510 mg via INTRAVENOUS
  Filled 2018-10-02: qty 17

## 2018-10-02 MED ORDER — SODIUM CHLORIDE 0.9 % IV SOLN
Freq: Once | INTRAVENOUS | Status: AC
  Administered 2018-10-02: 11:00:00 via INTRAVENOUS
  Filled 2018-10-02: qty 1000

## 2018-10-02 MED ORDER — SODIUM CHLORIDE 0.9 % IV SOLN
INTRAVENOUS | Status: DC
  Administered 2018-10-02: 09:00:00 via INTRAVENOUS
  Filled 2018-10-02: qty 250

## 2018-10-02 MED ORDER — SODIUM CHLORIDE 0.9 % IV SOLN: Freq: Once | INTRAVENOUS | Status: DC

## 2018-10-20 ENCOUNTER — Telehealth: Payer: Self-pay | Admitting: *Deleted

## 2018-10-20 NOTE — Telephone Encounter (Signed)
Schedule message sent. 

## 2018-10-20 NOTE — Telephone Encounter (Signed)
Please schedule her lab appointment per her request, thanks   Truitt Merle MD

## 2018-10-20 NOTE — Telephone Encounter (Signed)
Spoke with pt and was informed that she was feeling " Off today ".  Stated she had MVI infusion on 10/02/18; however pt still feels queazy, nauseated but no vomiting.  This feeling leads to anxiety attack for pt. Pt requested lab rechecked for Vit A & D levels. Pt stated when she experienced these same symptoms before, her Vit  A & D levels were very low. Pt's   Phone     [phone].

## 2018-10-22 ENCOUNTER — Other Ambulatory Visit: Payer: Medicare HMO

## 2018-10-23 ENCOUNTER — Other Ambulatory Visit: Payer: Self-pay

## 2018-10-23 ENCOUNTER — Inpatient Hospital Stay: Payer: Medicare HMO

## 2018-10-23 DIAGNOSIS — D509 Iron deficiency anemia, unspecified: Secondary | ICD-10-CM | POA: Diagnosis not present

## 2018-10-23 DIAGNOSIS — D539 Nutritional anemia, unspecified: Secondary | ICD-10-CM | POA: Diagnosis not present

## 2018-10-23 DIAGNOSIS — E569 Vitamin deficiency, unspecified: Secondary | ICD-10-CM | POA: Diagnosis not present

## 2018-10-23 LAB — IRON AND TIBC
Iron: 59 ug/dL (ref 41–142)
Saturation Ratios: 17 % — ABNORMAL LOW (ref 21–57)
TIBC: 346 ug/dL (ref 236–444)
UIBC: 287 ug/dL (ref 120–384)

## 2018-10-23 LAB — CBC WITH DIFFERENTIAL (CANCER CENTER ONLY)
Abs Immature Granulocytes: 0.02 10*3/uL (ref 0.00–0.07)
Basophils Absolute: 0 10*3/uL (ref 0.0–0.1)
Basophils Relative: 0 %
Eosinophils Absolute: 0 10*3/uL (ref 0.0–0.5)
Eosinophils Relative: 0 %
HCT: 37.6 % (ref 36.0–46.0)
Hemoglobin: 11.9 g/dL — ABNORMAL LOW (ref 12.0–15.0)
Immature Granulocytes: 0 %
Lymphocytes Relative: 14 %
Lymphs Abs: 1.4 10*3/uL (ref 0.7–4.0)
MCH: 31.8 pg (ref 26.0–34.0)
MCHC: 31.6 g/dL (ref 30.0–36.0)
MCV: 100.5 fL — ABNORMAL HIGH (ref 80.0–100.0)
Monocytes Absolute: 0.6 10*3/uL (ref 0.1–1.0)
Monocytes Relative: 6 %
Neutro Abs: 7.9 10*3/uL — ABNORMAL HIGH (ref 1.7–7.7)
Neutrophils Relative %: 80 %
Platelet Count: 228 10*3/uL (ref 150–400)
RBC: 3.74 MIL/uL — ABNORMAL LOW (ref 3.87–5.11)
RDW: 13.6 % (ref 11.5–15.5)
WBC Count: 9.9 10*3/uL (ref 4.0–10.5)
nRBC: 0 % (ref 0.0–0.2)

## 2018-10-23 LAB — FERRITIN: Ferritin: 92 ng/mL (ref 11–307)

## 2018-11-01 ENCOUNTER — Other Ambulatory Visit: Payer: Self-pay | Admitting: Hematology

## 2018-11-01 DIAGNOSIS — E559 Vitamin D deficiency, unspecified: Secondary | ICD-10-CM

## 2018-11-01 DIAGNOSIS — E509 Vitamin A deficiency, unspecified: Secondary | ICD-10-CM

## 2018-11-02 ENCOUNTER — Other Ambulatory Visit: Payer: Self-pay

## 2018-11-02 ENCOUNTER — Telehealth: Payer: Self-pay

## 2018-11-02 ENCOUNTER — Telehealth: Payer: Self-pay | Admitting: Hematology

## 2018-11-02 DIAGNOSIS — D509 Iron deficiency anemia, unspecified: Secondary | ICD-10-CM

## 2018-11-02 NOTE — Telephone Encounter (Signed)
Scheduled appt per 7/06 sch message - pt aware of appt date and time

## 2018-11-02 NOTE — Telephone Encounter (Signed)
-----   Message from Truitt Merle, MD sent at 11/01/2018 11:16 AM EDT ----- Malachy Mood, could you call lab to see if they still can add vitD level to her lab draw from 6/26? Please let pt know that her iron level was good last week, Hg 11.9 which was better than before, her VitD level was low and VitA was normal in May and she had MVI infusion on 6/5, I doubt her vitD and A levels are low now. If she is still very fatigued, we can check her levels (including VitD and A) in late July. Thanks  Truitt Merle  11/01/2018

## 2018-11-02 NOTE — Telephone Encounter (Signed)
Spoke to patient regarding lab results.  Per Dr. Burr Medico iron level was good last week, hemoglobin 11.9 which is better than before.  She is still having some fatigue, explained we will check her Vitamin D and A levels in late July.  She is requesting a CMP as well.  Scheduling message was sent for lab only in late July, orders are placed.  Patient verbalized an understanding.

## 2018-11-11 ENCOUNTER — Other Ambulatory Visit: Payer: Self-pay | Admitting: Obstetrics & Gynecology

## 2018-11-11 DIAGNOSIS — N761 Subacute and chronic vaginitis: Secondary | ICD-10-CM | POA: Diagnosis not present

## 2018-11-11 DIAGNOSIS — R102 Pelvic and perineal pain: Secondary | ICD-10-CM | POA: Diagnosis not present

## 2018-11-11 DIAGNOSIS — D250 Submucous leiomyoma of uterus: Secondary | ICD-10-CM | POA: Diagnosis not present

## 2018-11-11 DIAGNOSIS — N939 Abnormal uterine and vaginal bleeding, unspecified: Secondary | ICD-10-CM | POA: Diagnosis not present

## 2018-11-23 ENCOUNTER — Inpatient Hospital Stay: Payer: Medicare HMO

## 2018-11-25 ENCOUNTER — Inpatient Hospital Stay: Payer: Medicare HMO | Attending: Hematology

## 2018-11-25 ENCOUNTER — Other Ambulatory Visit: Payer: Self-pay

## 2018-11-25 DIAGNOSIS — D509 Iron deficiency anemia, unspecified: Secondary | ICD-10-CM | POA: Insufficient documentation

## 2018-11-25 DIAGNOSIS — E559 Vitamin D deficiency, unspecified: Secondary | ICD-10-CM | POA: Diagnosis not present

## 2018-11-25 DIAGNOSIS — E509 Vitamin A deficiency, unspecified: Secondary | ICD-10-CM

## 2018-11-25 LAB — CBC WITH DIFFERENTIAL (CANCER CENTER ONLY)
Abs Immature Granulocytes: 0.02 10*3/uL (ref 0.00–0.07)
Basophils Absolute: 0 10*3/uL (ref 0.0–0.1)
Basophils Relative: 0 %
Eosinophils Absolute: 0.1 10*3/uL (ref 0.0–0.5)
Eosinophils Relative: 1 %
HCT: 36.5 % (ref 36.0–46.0)
Hemoglobin: 11.8 g/dL — ABNORMAL LOW (ref 12.0–15.0)
Immature Granulocytes: 0 %
Lymphocytes Relative: 22 %
Lymphs Abs: 1.6 10*3/uL (ref 0.7–4.0)
MCH: 32.2 pg (ref 26.0–34.0)
MCHC: 32.3 g/dL (ref 30.0–36.0)
MCV: 99.7 fL (ref 80.0–100.0)
Monocytes Absolute: 0.7 10*3/uL (ref 0.1–1.0)
Monocytes Relative: 10 %
Neutro Abs: 4.8 10*3/uL (ref 1.7–7.7)
Neutrophils Relative %: 67 %
Platelet Count: 178 10*3/uL (ref 150–400)
RBC: 3.66 MIL/uL — ABNORMAL LOW (ref 3.87–5.11)
RDW: 12.7 % (ref 11.5–15.5)
WBC Count: 7.2 10*3/uL (ref 4.0–10.5)
nRBC: 0 % (ref 0.0–0.2)

## 2018-11-25 LAB — IRON AND TIBC
Iron: 63 ug/dL (ref 41–142)
Saturation Ratios: 19 % — ABNORMAL LOW (ref 21–57)
TIBC: 339 ug/dL (ref 236–444)
UIBC: 275 ug/dL (ref 120–384)

## 2018-11-25 LAB — CMP (CANCER CENTER ONLY)
ALT: 54 U/L — ABNORMAL HIGH (ref 0–44)
AST: 38 U/L (ref 15–41)
Albumin: 4.1 g/dL (ref 3.5–5.0)
Alkaline Phosphatase: 69 U/L (ref 38–126)
Anion gap: 6 (ref 5–15)
BUN: 14 mg/dL (ref 6–20)
CO2: 23 mmol/L (ref 22–32)
Calcium: 8.7 mg/dL — ABNORMAL LOW (ref 8.9–10.3)
Chloride: 109 mmol/L (ref 98–111)
Creatinine: 0.77 mg/dL (ref 0.44–1.00)
GFR, Est AFR Am: >60 mL/min (ref >60)
GFR, Estimated: >60 mL/min (ref >60)
Glucose, Bld: 75 mg/dL (ref 70–99)
Potassium: 3.8 mmol/L (ref 3.5–5.1)
Sodium: 138 mmol/L (ref 135–145)
Total Bilirubin: 0.5 mg/dL (ref 0.3–1.2)
Total Protein: 7.8 g/dL (ref 6.5–8.1)

## 2018-11-25 LAB — FERRITIN: Ferritin: 71 ng/mL (ref 11–307)

## 2018-11-26 LAB — VITAMIN D 25 HYDROXY (VIT D DEFICIENCY, FRACTURES): Vit D, 25-Hydroxy: 23.5 ng/mL — ABNORMAL LOW (ref 30.0–100.0)

## 2018-11-27 LAB — VITAMIN A: Vitamin A (Retinoic Acid): 32 ug/dL (ref 20.1–62.0)

## 2018-12-02 ENCOUNTER — Telehealth: Payer: Self-pay | Admitting: Hematology

## 2018-12-02 ENCOUNTER — Telehealth: Payer: Self-pay

## 2018-12-02 NOTE — Telephone Encounter (Signed)
Spoke with patient regarding her lab results.  Conveyed all from Dr. Ernestina Penna note.  Patient does not want MVI infusion before surgery but would like it two weeks after.  We will cancel August lab appointment and schedule MVI infusion for sometime around 9/3 or 9/4, scheduling message was sent.

## 2018-12-02 NOTE — Telephone Encounter (Signed)
R/s apt per 8/05 sch message- pt aware of appt date and time

## 2018-12-02 NOTE — Telephone Encounter (Signed)
-----   Message from Truitt Merle, MD sent at 11/29/2018  2:40 PM EDT ----- Please let pt know her lab results, iron level good, VitD level slightly low but better then 2 months ago, calcium level low, Hg 11.8, stable, no other concerns. I don't think she needs IV iron or MVI now, but will give MVI on her next infusion appointment, OK to move her next infusion to before her surgery 8/19 if she wants (then cancel her lab in August), thanks   Truitt Merle  11/29/2018

## 2018-12-08 ENCOUNTER — Encounter (HOSPITAL_BASED_OUTPATIENT_CLINIC_OR_DEPARTMENT_OTHER): Payer: Self-pay | Admitting: *Deleted

## 2018-12-08 ENCOUNTER — Other Ambulatory Visit: Payer: Self-pay

## 2018-12-09 ENCOUNTER — Encounter (HOSPITAL_BASED_OUTPATIENT_CLINIC_OR_DEPARTMENT_OTHER)
Admission: RE | Admit: 2018-12-09 | Discharge: 2018-12-09 | Disposition: A | Discharge status: Home / Self Care | Payer: Medicare HMO | Source: Ambulatory Visit | Attending: Obstetrics & Gynecology | Admitting: Obstetrics & Gynecology

## 2018-12-09 DIAGNOSIS — N854 Malposition of uterus: Secondary | ICD-10-CM | POA: Diagnosis not present

## 2018-12-09 DIAGNOSIS — N939 Abnormal uterine and vaginal bleeding, unspecified: Secondary | ICD-10-CM | POA: Diagnosis not present

## 2018-12-09 DIAGNOSIS — Z01812 Encounter for preprocedural laboratory examination: Secondary | ICD-10-CM | POA: Diagnosis not present

## 2018-12-09 DIAGNOSIS — D250 Submucous leiomyoma of uterus: Secondary | ICD-10-CM | POA: Diagnosis not present

## 2018-12-09 DIAGNOSIS — Z87891 Personal history of nicotine dependence: Secondary | ICD-10-CM | POA: Diagnosis not present

## 2018-12-09 DIAGNOSIS — Z20828 Contact with and (suspected) exposure to other viral communicable diseases: Secondary | ICD-10-CM | POA: Diagnosis not present

## 2018-12-09 DIAGNOSIS — Z79899 Other long term (current) drug therapy: Secondary | ICD-10-CM | POA: Diagnosis not present

## 2018-12-09 DIAGNOSIS — F329 Major depressive disorder, single episode, unspecified: Secondary | ICD-10-CM | POA: Diagnosis not present

## 2018-12-09 LAB — CBC
HCT: 37.5 % (ref 36.0–46.0)
Hemoglobin: 12 g/dL (ref 12.0–15.0)
MCH: 32.1 pg (ref 26.0–34.0)
MCHC: 32 g/dL (ref 30.0–36.0)
MCV: 100.3 fL — ABNORMAL HIGH (ref 80.0–100.0)
Platelets: 250 10*3/uL (ref 150–400)
RBC: 3.74 MIL/uL — ABNORMAL LOW (ref 3.87–5.11)
RDW: 12.4 % (ref 11.5–15.5)
WBC: 9.2 10*3/uL (ref 4.0–10.5)
nRBC: 0 % (ref 0.0–0.2)

## 2018-12-09 LAB — COMPREHENSIVE METABOLIC PANEL
ALT: 24 U/L (ref 0–44)
AST: 33 U/L (ref 15–41)
Albumin: 4.3 g/dL (ref 3.5–5.0)
Alkaline Phosphatase: 63 U/L (ref 38–126)
Anion gap: 7 (ref 5–15)
BUN: 12 mg/dL (ref 6–20)
CO2: 28 mmol/L (ref 22–32)
Calcium: 9 mg/dL (ref 8.9–10.3)
Chloride: 101 mmol/L (ref 98–111)
Creatinine, Ser: 0.7 mg/dL (ref 0.44–1.00)
GFR calc Af Amer: >60 mL/min (ref >60)
GFR calc non Af Amer: >60 mL/min (ref >60)
Glucose, Bld: 87 mg/dL (ref 70–99)
Potassium: 5 mmol/L (ref 3.5–5.1)
Sodium: 136 mmol/L (ref 135–145)
Total Bilirubin: 0.7 mg/dL (ref 0.3–1.2)
Total Protein: 7.5 g/dL (ref 6.5–8.1)

## 2018-12-09 LAB — POCT PREGNANCY, URINE: Preg Test, Ur: NEGATIVE

## 2018-12-12 ENCOUNTER — Other Ambulatory Visit (HOSPITAL_COMMUNITY)
Admission: RE | Admit: 2018-12-12 | Discharge: 2018-12-12 | Disposition: A | Discharge status: Home / Self Care | Payer: Medicare HMO | Source: Ambulatory Visit | Attending: Obstetrics & Gynecology | Admitting: Obstetrics & Gynecology

## 2018-12-12 DIAGNOSIS — Z01812 Encounter for preprocedural laboratory examination: Secondary | ICD-10-CM | POA: Diagnosis not present

## 2018-12-12 DIAGNOSIS — Z20828 Contact with and (suspected) exposure to other viral communicable diseases: Secondary | ICD-10-CM | POA: Diagnosis not present

## 2018-12-12 LAB — SARS CORONAVIRUS 2 (TAT 6-24 HRS): SARS Coronavirus 2: NEGATIVE

## 2018-12-13 NOTE — H&P (Signed)
43yo G0 who presents for hysteroscopy, D&C, endometrial ablation due to AUB  In review, she has long standing h/o HMB. Management has been attempted with POPs; however, bleeding is still moderate to heavy. Notes that the periods are much more "liquidy" and often can soak through a pad in under an hour. She is back to wearing diapers due to the bleeding. She also notes significant dysmenorrhea Last [place] 10/2018: 9.3cm retroverted uterus with fibroids- increased in size- largest post fundal 5.1cm, right 2.3cm, right 2cm, post fundal 2.4cm. Submucosal fibroid pushes on cavity. Bilateral ovareis with simple follicels- ~6.3KZ- avascular. Prior ovarian cysts resolved.   Denies vaginal discharge, itching or irritation. Denies pelvic or abdominal pain Last Pap 04/2016 neg Last mam 2019? -.   Current Medications  Taking   B-Complex-C 150 MG Tablet 1 capsule Orally once a day   Caltrate 600+D Soft 600-800 MG-UNIT Tablet Chewable 1 capsule Orally twice a day   iron 1 tab by mouth once a day, Notes: IV infusion every 3 months-last one was 04/25/2016   Multi Vitamin Daily Tablet 1 tablet Orally Once a day   Norethindrone 0.35 MG Tablet 1 tablet Orally Once a day   Sertraline HCl 50 MG Tablet 1 tab daily Orally Once a day   Supplement: Dry A 25,000 units 1 capsule by mouth once a day   Supplement: Dry E 400 IU 1 capsule by mouth once a day   Vitamin B 12 250 MCG Lozenge 2 tablet by mouth once a day   Vitamin K1 1000 MCG Capsule 1 capsule by mouth once a day   Zolpidem Tartrate 10 MG Tablet 1/2-1 tablet at bedtime as needed Orally Once a day   Copper Sulfate 0.4 MG/ML Solution as directed Intravenous every 77m mo   Azelastine HCl 137 MCG/SPRAY Solution 1 puff in each nostril Nasally Twice a day   Not-Taking   Fluconazole 150 MG Tablet 1 tablet Orally once then repeat in 2 days   Fluconazole 150 MG Tablet 1 tablet Orally once then repeat in 3 days if needed   Omeprazole 40 MG Capsule Delayed Release 1 capsule  30 minutes before morning meal Orally Once a day   Benzonatate 200 MG Capsule 1 capsule Orally Three times a day prn cough   Medication List reviewed and reconciled with the patient    Past Medical History  Blindness secondary to pseudotumor cerebri s/p shunt & removal.   Anemia.   Depression.   Lipoma- left axilla area.    Surgical History  LP shunt removed 2008  Duodenal switch for weight loss 2007  Left breast-fibrocystic tissue removal 2009  Hysteroscopy and D&C-[facility] 2013  endoscopy 11/2014   Family History  Father: deceased  Mother: alive 63 yrs, diagnosed with Hypertension  Brother 1: alive 34 yrs, Hypertension  Brother2: alive 3 yrs, Hypertension  Brother 3: alive 41 yrs  no family history of colon cancer,colon polyps,or liver disease\nPaternal great aunt had breast cancer.   Social History  General:  EXPOSURE TO PASSIVE SMOKE: yes.  Alcohol: yes, Rare.  Children: none.  Caffeine: yes, 1-2 servings daily, coffee.  Tobacco use  cigarettes: Former smoker Quit in year 2019 Tobacco history last updated 12/09/2018 Vaping No Marital Status: single.  no Recreational drug use.  OCCUPATION: employed, massage therapist.  Exercise: yes, 1-2 times per weekly.    Gyn History  Sexual activity not currently sexually active.  Periods : Irregular.  LMP End of Decemeber around 04/27/16.  Denies  H/O Birth control.  Last pap smear date 04/2016 .  Last mammogram date 2009 fibrocystic tissue.  Denies H/O Abnormal pap smear.    OB History  Never been pregnant per patient.    Allergies  Flu Vaccine: Pincus Badder- barre syndrome - Allergy   Hospitalization/Major Diagnostic Procedure  none in the past year 03/2016   Review of Systems  CONSTITUTIONAL:  no Appetite changes. no Chills. Fatigue yes. no Fever.  CARDIOLOGY:  no Chest pain.  RESPIRATORY:  no Shortness of breath. no Cough.  UROLOGY:  no Urinary urgency. Urinary frequency yes. no  Urinary incontinence. no Dysuria. Incontinence yes.  GASTROENTEROLOGY:  no Abdominal pain. no Change in bowel habits. no Change in bowel movements.  FEMALE REPRODUCTIVE:  See HPI for details. no Breast pain. no Breast tenderness.  NEUROLOGY:  no Dizziness. Headache yes.  PSYCHOLOGY:  no Anxiety. no Depression.  SKIN:  no Rash. no Hives.  HEMATOLOGY/LYMPH:  no Anemia. Using Blood Thinners no.     Vital Signs  Wt 205, Wt change -2 lb, Ht 67, BMI 32.10, Temp 98.0, Pulse sitting 91, BP sitting 112/70.   Examination  General Examination: CONSTITUTIONAL: well developed, well nourished.  SKIN: warm and dry, no rashes.  NECK: supple, normal appearance.  LUNGS: clear to auscultation bilaterally, no wheezes, rhonchi, rales.  HEART: no murmurs, regular rate and rhythm.  ABDOMEN: soft and not tender, no rebound, no rigidity.  MUSCULOSKELETAL no calf tenderness bilaterally.  EXTREMITIES: no edema present.  NEUROLOGIC EXAM: alert and oriented x 3.  PSYCH: appropriate mood and affect.     A/P: 43yo G0 who presents for hysteroscopy, D&C, HTA- endometrial ablation -NPO LR @ 125cc/hr -SCDs to OR -Risk/benefit and alternatives reviewed with patient including risk of bleeding, infection, uterine perforation and/or potential inability to complete ablation.  Questions and concerns were addressed and she desires to proceed  Janyth Pupa, DO [phone] (cell) [phone] (office)

## 2018-12-16 ENCOUNTER — Ambulatory Visit (HOSPITAL_BASED_OUTPATIENT_CLINIC_OR_DEPARTMENT_OTHER): Payer: Medicare HMO | Admitting: Certified Registered"

## 2018-12-16 ENCOUNTER — Ambulatory Visit (HOSPITAL_BASED_OUTPATIENT_CLINIC_OR_DEPARTMENT_OTHER)
Admission: RE | Admit: 2018-12-16 | Discharge: 2018-12-16 | Disposition: A | Discharge status: Home / Self Care | Payer: Medicare HMO | Attending: Obstetrics & Gynecology | Admitting: Obstetrics & Gynecology

## 2018-12-16 ENCOUNTER — Encounter (HOSPITAL_BASED_OUTPATIENT_CLINIC_OR_DEPARTMENT_OTHER): Payer: Self-pay

## 2018-12-16 ENCOUNTER — Other Ambulatory Visit: Payer: Self-pay

## 2018-12-16 ENCOUNTER — Encounter (HOSPITAL_BASED_OUTPATIENT_CLINIC_OR_DEPARTMENT_OTHER)
Admission: RE | Disposition: A | Discharge status: Home / Self Care | Payer: Self-pay | Source: Home / Self Care | Attending: Obstetrics & Gynecology

## 2018-12-16 DIAGNOSIS — N939 Abnormal uterine and vaginal bleeding, unspecified: Secondary | ICD-10-CM | POA: Diagnosis not present

## 2018-12-16 DIAGNOSIS — Z01812 Encounter for preprocedural laboratory examination: Secondary | ICD-10-CM | POA: Diagnosis not present

## 2018-12-16 DIAGNOSIS — Z87891 Personal history of nicotine dependence: Secondary | ICD-10-CM | POA: Insufficient documentation

## 2018-12-16 DIAGNOSIS — Z79899 Other long term (current) drug therapy: Secondary | ICD-10-CM | POA: Insufficient documentation

## 2018-12-16 DIAGNOSIS — E559 Vitamin D deficiency, unspecified: Secondary | ICD-10-CM | POA: Diagnosis not present

## 2018-12-16 DIAGNOSIS — D250 Submucous leiomyoma of uterus: Secondary | ICD-10-CM | POA: Insufficient documentation

## 2018-12-16 DIAGNOSIS — F329 Major depressive disorder, single episode, unspecified: Secondary | ICD-10-CM | POA: Insufficient documentation

## 2018-12-16 DIAGNOSIS — N854 Malposition of uterus: Secondary | ICD-10-CM | POA: Insufficient documentation

## 2018-12-16 DIAGNOSIS — E119 Type 2 diabetes mellitus without complications: Secondary | ICD-10-CM | POA: Diagnosis not present

## 2018-12-16 HISTORY — PX: DILITATION & CURRETTAGE/HYSTROSCOPY WITH HYDROTHERMAL ABLATION: SHX5570

## 2018-12-16 SURGERY — DILATATION & CURETTAGE/HYSTEROSCOPY WITH HYDROTHERMAL ABLATION
Anesthesia: General | Site: Uterus

## 2018-12-16 MED ORDER — PROPOFOL 10 MG/ML IV BOLUS
INTRAVENOUS | Status: DC | PRN
  Administered 2018-12-16: 180 mg via INTRAVENOUS

## 2018-12-16 MED ORDER — LIDOCAINE-EPINEPHRINE 1 %-1:100000 IJ SOLN
INTRAMUSCULAR | Status: DC | PRN
  Administered 2018-12-16: 20 mL

## 2018-12-16 MED ORDER — LIDOCAINE 2% (20 MG/ML) 5 ML SYRINGE
INTRAMUSCULAR | Status: DC | PRN
  Administered 2018-12-16: 80 mg via INTRAVENOUS

## 2018-12-16 MED ORDER — ACETAMINOPHEN 500 MG PO TABS
ORAL_TABLET | ORAL | Status: AC
  Filled 2018-12-16: qty 2

## 2018-12-16 MED ORDER — LACTATED RINGERS IV SOLN
INTRAVENOUS | Status: DC
  Administered 2018-12-16: 15:00:00 via INTRAVENOUS

## 2018-12-16 MED ORDER — ACETAMINOPHEN 325 MG PO TABS: 325.0000 mg | ORAL_TABLET | ORAL | Status: DC | PRN

## 2018-12-16 MED ORDER — LACTATED RINGERS IV SOLN: INTRAVENOUS | Status: DC

## 2018-12-16 MED ORDER — PHENYLEPHRINE HCL (PRESSORS) 10 MG/ML IV SOLN
INTRAVENOUS | Status: AC
  Filled 2018-12-16: qty 1

## 2018-12-16 MED ORDER — SODIUM CHLORIDE 0.9 % IR SOLN
Status: DC | PRN
  Administered 2018-12-16: 1

## 2018-12-16 MED ORDER — MIDAZOLAM HCL 2 MG/2ML IJ SOLN
INTRAMUSCULAR | Status: AC
  Filled 2018-12-16: qty 2

## 2018-12-16 MED ORDER — PROPOFOL 500 MG/50ML IV EMUL
INTRAVENOUS | Status: AC
  Filled 2018-12-16: qty 50

## 2018-12-16 MED ORDER — EPHEDRINE 5 MG/ML INJ
INTRAVENOUS | Status: AC
  Filled 2018-12-16: qty 30

## 2018-12-16 MED ORDER — ONDANSETRON HCL 4 MG/2ML IJ SOLN
INTRAMUSCULAR | Status: DC | PRN
  Administered 2018-12-16: 4 mg via INTRAVENOUS

## 2018-12-16 MED ORDER — MIDAZOLAM HCL 2 MG/2ML IJ SOLN
1.0000 mg | INTRAMUSCULAR | Status: DC | PRN
  Administered 2018-12-16: 2 mg via INTRAVENOUS

## 2018-12-16 MED ORDER — LIDOCAINE 2% (20 MG/ML) 5 ML SYRINGE
INTRAMUSCULAR | Status: AC
  Filled 2018-12-16: qty 5

## 2018-12-16 MED ORDER — ROCURONIUM BROMIDE 10 MG/ML (PF) SYRINGE
PREFILLED_SYRINGE | INTRAVENOUS | Status: AC
  Filled 2018-12-16: qty 10

## 2018-12-16 MED ORDER — FENTANYL CITRATE (PF) 100 MCG/2ML IJ SOLN
INTRAMUSCULAR | Status: AC
  Filled 2018-12-16: qty 2

## 2018-12-16 MED ORDER — FENTANYL CITRATE (PF) 100 MCG/2ML IJ SOLN
50.0000 ug | INTRAMUSCULAR | Status: DC | PRN
  Administered 2018-12-16: 100 ug via INTRAVENOUS

## 2018-12-16 MED ORDER — ACETAMINOPHEN 500 MG PO TABS
1000.0000 mg | ORAL_TABLET | ORAL | Status: AC
  Administered 2018-12-16: 1000 mg via ORAL

## 2018-12-16 MED ORDER — ACETAMINOPHEN 160 MG/5ML PO SOLN: 325.0000 mg | ORAL | Status: DC | PRN

## 2018-12-16 MED ORDER — OXYCODONE HCL 5 MG/5ML PO SOLN: 5.0000 mg | Freq: Once | ORAL | Status: DC | PRN

## 2018-12-16 MED ORDER — FENTANYL CITRATE (PF) 100 MCG/2ML IJ SOLN
25.0000 ug | INTRAMUSCULAR | Status: DC | PRN
  Administered 2018-12-16: 50 ug via INTRAVENOUS

## 2018-12-16 MED ORDER — MEPERIDINE HCL 25 MG/ML IJ SOLN: 6.2500 mg | INTRAMUSCULAR | Status: DC | PRN

## 2018-12-16 MED ORDER — OXYCODONE HCL 5 MG PO TABS: 5.0000 mg | ORAL_TABLET | Freq: Once | ORAL | Status: DC | PRN

## 2018-12-16 MED ORDER — DEXAMETHASONE SODIUM PHOSPHATE 4 MG/ML IJ SOLN
INTRAMUSCULAR | Status: DC | PRN
  Administered 2018-12-16: 10 mg via INTRAVENOUS

## 2018-12-16 MED ORDER — ONDANSETRON HCL 4 MG/2ML IJ SOLN: 4.0000 mg | Freq: Once | INTRAMUSCULAR | Status: DC | PRN

## 2018-12-16 MED ORDER — SOD CITRATE-CITRIC ACID 500-334 MG/5ML PO SOLN
30.0000 mL | ORAL | Status: AC
  Administered 2018-12-16: 13:00:00 30 mL via ORAL
  Filled 2018-12-16: qty 30

## 2018-12-16 MED ORDER — PROPOFOL 10 MG/ML IV BOLUS
INTRAVENOUS | Status: AC
  Filled 2018-12-16: qty 20

## 2018-12-16 MED ORDER — SCOPOLAMINE 1 MG/3DAYS TD PT72: 1.0000 | MEDICATED_PATCH | Freq: Once | TRANSDERMAL | Status: DC

## 2018-12-16 MED ORDER — ONDANSETRON HCL 4 MG/2ML IJ SOLN
INTRAMUSCULAR | Status: AC
  Filled 2018-12-16: qty 2

## 2018-12-16 SURGICAL SUPPLY — 18 items
BRIEF STRETCH FOR OB PAD XXL (UNDERPADS AND DIAPERS) ×2 IMPLANT
CATH ROBINSON RED A/P 16FR (CATHETERS) ×1 IMPLANT
CONTAINER PREFILL 10% NBF 60ML (FORM) ×1 IMPLANT
DILATOR CANAL MILEX (MISCELLANEOUS) IMPLANT
GAUZE 4X4 16PLY RFD (DISPOSABLE) ×2 IMPLANT
GLOVE BIOGEL PI IND STRL 6.5 (GLOVE) ×1 IMPLANT
GLOVE BIOGEL PI IND STRL 7.0 (GLOVE) ×1 IMPLANT
GLOVE BIOGEL PI INDICATOR 6.5 (GLOVE) ×1
GLOVE BIOGEL PI INDICATOR 7.0 (GLOVE) ×1
GLOVE ECLIPSE 6.5 STRL STRAW (GLOVE) ×2 IMPLANT
GOWN STRL REUS W/ TWL LRG LVL3 (GOWN DISPOSABLE) ×1 IMPLANT
GOWN STRL REUS W/TWL LRG LVL3 (GOWN DISPOSABLE) ×5 IMPLANT
PACK VAGINAL MINOR WOMEN LF (CUSTOM PROCEDURE TRAY) ×2 IMPLANT
PAD OB MATERNITY 4.3X12.25 (PERSONAL CARE ITEMS) ×2 IMPLANT
PAD PREP 24X48 CUFFED NSTRL (MISCELLANEOUS) ×2 IMPLANT
SET GENESYS HTA PROCERVA (MISCELLANEOUS) ×2 IMPLANT
SLEEVE SCD COMPRESS KNEE MED (MISCELLANEOUS) ×4 IMPLANT
TOWEL GREEN STERILE FF (TOWEL DISPOSABLE) ×2 IMPLANT

## 2018-12-16 NOTE — Anesthesia Procedure Notes (Signed)
Procedure Name: LMA Insertion Date/Time: 12/16/2018 3:32 PM Performed by: Lyndee Leo, CRNA Pre-anesthesia Checklist: Patient identified, Emergency Drugs available, Suction available and Patient being monitored Patient Re-evaluated:Patient Re-evaluated prior to induction Oxygen Delivery Method: Circle system utilized Preoxygenation: Pre-oxygenation with 100% oxygen Induction Type: IV induction Ventilation: Mask ventilation without difficulty LMA: LMA inserted LMA Size: 4.0 Number of attempts: 1 Airway Equipment and Method: Bite block Placement Confirmation: positive ETCO2 Tube secured with: Tape Dental Injury: Teeth and Oropharynx as per pre-operative assessment

## 2018-12-16 NOTE — Interval H&P Note (Signed)
History and Physical Interval Note:  12/16/2018 3:22 PM  Mackenzie [name]  has presented today for surgery, with the diagnosis of abnormal uterine bleeding  submucous uterine fibroid.  The various methods of treatment have been discussed with the patient and family. After consideration of risks, benefits and other options for treatment, the patient has consented to  Procedure(s): DILATATION & CURETTAGE/HYSTEROSCOPY WITH HYDROTHERMAL ABLATION (N/A) as a surgical intervention.  The patient's history has been reviewed, patient examined, no change in status, stable for surgery.  I have reviewed the patient's chart and labs.  Questions were answered to the patient's satisfaction.     [name]

## 2018-12-16 NOTE — Transfer of Care (Signed)
Immediate Anesthesia Transfer of Care Note  Patient: Mackenzie [name]  Procedure(s) Performed: DILATATION & CURETTAGE/HYSTEROSCOPY WITH HYDROTHERMAL ABLATION (N/A Uterus)  Patient Location: PACU  Anesthesia Type:General  Level of Consciousness: awake, alert  and oriented  Airway & Oxygen Therapy: Patient Spontanous Breathing  Post-op Assessment: Report given to RN and Post -op Vital signs reviewed and stable  Post vital signs: Reviewed and stable  Last Vitals:  Vitals Value Taken Time  BP    Temp    Pulse 89 12/16/18 1612  Resp 17 12/16/18 1612  SpO2 100 % 12/16/18 1612    Last Pain:  Vitals:   12/16/18 1305  TempSrc: Oral  PainSc: 0-No pain      Patients Stated Pain Goal: 5 (18/86/77 3736)  Complications: No apparent anesthesia complications

## 2018-12-16 NOTE — Anesthesia Postprocedure Evaluation (Signed)
Anesthesia Post Note  Patient: Mackenzie [name]  Procedure(s) Performed: DILATATION & CURETTAGE/HYSTEROSCOPY WITH HYDROTHERMAL ABLATION (N/A Uterus)     Patient location during evaluation: PACU Anesthesia Type: General Level of consciousness: awake and alert Pain management: pain level controlled Vital Signs Assessment: post-procedure vital signs reviewed and stable Respiratory status: spontaneous breathing, nonlabored ventilation, respiratory function stable and patient connected to nasal cannula oxygen Cardiovascular status: blood pressure returned to baseline and stable Postop Assessment: no apparent nausea or vomiting Anesthetic complications: no    Last Vitals:  Vitals:   12/16/18 1615 12/16/18 1630  BP: 108/72 113/74  Pulse: 88 84  Resp: 10 11  Temp:    SpO2: 100% 99%    Last Pain:  Vitals:   12/16/18 1630  TempSrc:   PainSc: 7                  Karlissa Aron

## 2018-12-16 NOTE — Op Note (Signed)
Operative Report  PreOp: 1) abnormal uterine bleeding PostOp: same Procedure:  Hysteroscopy, Dilation and Curettage, Endometrial ablation Surgeon: Dr. Janyth Pupa Anesthesia: General Complications:none EBL: 02RK UOP: 100cc IVF:1000cc  Findings:9cm retroverted uterus, both ostia visualized, proliferative endometrium  Specimens: endometrial curettings  Procedure: The patient was taken to the operating room where she underwent general anesthesia without difficulty. The patient was placed in a low lithotomy position using Allen stirrups. The patient was examined with the findings as noted above.  She was then prepped and draped in the normal sterile fashion. The bladder was drained using a red rubber urethral catheter. A sterile speculum was inserted into the vagina. A single tooth tenaculum was placed on the anterior lip of the cervix. Cervical block completed using 20cc of xylocaine 1% with epi.  The uterus was then sounded to 9cm. The endocervical canal was then serially dilated to 14French using Hank dilators.  Curettage was completed. The diagnostic hysteroscope was then inserted without difficulty and noted to have the findings as listed above.  Visualization was achieved using NS as a distending medium. The hysteroscope was removed and sharp curettage was performed. The tissue was sent to pathology.   Attention was then turned to the HTA system. The HTA was set up according to manufacture instructions. Global ablation was visualized and no uterine perforation was seen. All instrument were then removed. Hemostasis was observed at the cervical site.  The patient was repositioned to the supine position. The patient tolerated the procedure without any complications and taken to recovery in stable condition.   Janyth Pupa, DO [phone] (pager) [phone] (office)

## 2018-12-16 NOTE — Anesthesia Preprocedure Evaluation (Addendum)
Anesthesia Evaluation  Patient identified by MRN, date of birth, ID band Patient awake    Reviewed: Allergy & Precautions, H&P , Patient's Chart, lab work & pertinent test results, reviewed documented beta blocker date and time   History of Anesthesia Complications Negative for: history of anesthetic complications  Airway Mallampati: I  TM Distance: >3 FB Neck ROM: full    Dental no notable dental hx. (+) Teeth Intact, Dental Advisory Given   Pulmonary neg pulmonary ROS, former smoker,    Pulmonary exam normal breath sounds clear to auscultation       Cardiovascular Exercise Tolerance: Good negative cardio ROS   Rhythm:regular Rate:Normal     Neuro/Psych negative neurological ROS  negative psych ROS   GI/Hepatic negative GI ROS, Neg liver ROS, hiatal hernia,   Endo/Other  negative endocrine ROS  Renal/GU negative Renal ROS     Musculoskeletal   Abdominal   Peds  Hematology negative hematology ROS (+)   Anesthesia Other Findings Breast lump left   Blind 1994      Blood transfusion 04/2010 cancer center at [place] - iron transfusion per pt Pseudotumor cerebri 1994 History - caused blind    Neuromuscular disorder   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 Anemia   history    H/O hiatal hernia   repaired with weight loss surgery Depression   no meds       Reproductive/Obstetrics negative OB ROS                            Anesthesia Physical  Anesthesia Plan  ASA: III  Anesthesia Plan: General ETT and General   Post-op Pain Management:    Induction: Intravenous  PONV Risk Score and Plan: 3 and Ondansetron, Treatment may vary due to age or medical condition and Midazolam  Airway Management Planned: Oral ETT and LMA  Additional Equipment:   Intra-op Plan:   Post-operative Plan: Extubation in OR  Informed Consent: I have reviewed the patients History and Physical,  chart, labs and discussed the procedure including the risks, benefits and alternatives for the proposed anesthesia with the patient or authorized representative who has indicated his/her understanding and acceptance.     Dental Advisory Given  Plan Discussed with: CRNA and Surgeon  Anesthesia Plan Comments: (  )        Anesthesia Quick Evaluation

## 2018-12-16 NOTE — Discharge Instructions (Addendum)
HOME INSTRUCTIONS  Please note any unusual or excessive bleeding, pain, swelling. Mild dizziness or drowsiness are normal for about 24 hours after surgery.   Shower when comfortable  Restrictions: No driving for 24 hours or while taking pain medications.  Activity:  No heavy lifting (> 10 lbs), nothing in vagina (no tampons, douching, or intercourse) x 2 weeks; no tub baths for 2 weeks Vaginal spotting is expected but if your bleeding is heavy, period like,  please call the office    Diet:  You may return to your regular diet.  Do not eat large meals.  Eat small frequent meals throughout the day.  Continue to drink a good amount of water at least 6-8 glasses of water per day, hydration is very important for the healing process.  Pain Management: Take over the counter ibuprofen or tylenol for pain.  Always take prescription pain medication with food, it may cause constipation, increase fluids and fiber and you may want to take an over-the-counter stool softener like Colace as needed up to 2x a day.    Alcohol -- Avoid for 24 hours and while taking pain medications.  Nausea: Take sips of ginger ale or soda  Fever -- Call physician if temperature over 101 degrees  Follow up:  If you do not already have a follow up appointment scheduled, please call the office at [phone].  If you experience fever (a temperature greater than 100.4), pain unrelieved by pain medication, shortness of breath, swelling of a single leg, or any other symptoms which are concerning to you please the office immediately.    Post Anesthesia Home Care Instructions  Activity: Get plenty of rest for the remainder of the day. A responsible individual must stay with you for 24 hours following the procedure.  For the next 24 hours, DO NOT: -Drive a car -Paediatric nurse -Drink alcoholic beverages -Take any medication unless instructed by your physician -Make any legal decisions or sign important  papers.  Meals: Start with liquid foods such as gelatin or soup. Progress to regular foods as tolerated. Avoid greasy, spicy, heavy foods. If nausea and/or vomiting occur, drink only clear liquids until the nausea and/or vomiting subsides. Call your physician if vomiting continues.  Special Instructions/Symptoms: Your throat may feel dry or sore from the anesthesia or the breathing tube placed in your throat during surgery. If this causes discomfort, gargle with warm salt water. The discomfort should disappear within 24 hours.  If you had a scopolamine patch placed behind your ear for the management of post- operative nausea and/or vomiting:  1. The medication in the patch is effective for 72 hours, after which it should be removed.  Wrap patch in a tissue and discard in the trash. Wash hands thoroughly with soap and water. 2. You may remove the patch earlier than 72 hours if you experience unpleasant side effects which may include dry mouth, dizziness or visual disturbances. 3. Avoid touching the patch. Wash your hands with soap and water after contact with the patch.

## 2018-12-17 ENCOUNTER — Encounter (HOSPITAL_BASED_OUTPATIENT_CLINIC_OR_DEPARTMENT_OTHER): Payer: Self-pay | Admitting: Obstetrics & Gynecology

## 2018-12-21 ENCOUNTER — Other Ambulatory Visit: Payer: Medicare HMO

## 2018-12-24 ENCOUNTER — Ambulatory Visit: Payer: Medicare HMO

## 2018-12-31 ENCOUNTER — Inpatient Hospital Stay: Payer: Medicare HMO | Attending: Hematology

## 2018-12-31 ENCOUNTER — Other Ambulatory Visit: Payer: Self-pay

## 2018-12-31 VITALS — BP 112/78 | HR 73 | Temp 98.2°F | Resp 17

## 2018-12-31 DIAGNOSIS — Z79899 Other long term (current) drug therapy: Secondary | ICD-10-CM | POA: Insufficient documentation

## 2018-12-31 DIAGNOSIS — D649 Anemia, unspecified: Secondary | ICD-10-CM | POA: Diagnosis not present

## 2018-12-31 DIAGNOSIS — E559 Vitamin D deficiency, unspecified: Secondary | ICD-10-CM | POA: Insufficient documentation

## 2018-12-31 DIAGNOSIS — R5383 Other fatigue: Secondary | ICD-10-CM | POA: Diagnosis not present

## 2018-12-31 DIAGNOSIS — N920 Excessive and frequent menstruation with regular cycle: Secondary | ICD-10-CM | POA: Insufficient documentation

## 2018-12-31 DIAGNOSIS — E509 Vitamin A deficiency, unspecified: Secondary | ICD-10-CM | POA: Insufficient documentation

## 2018-12-31 DIAGNOSIS — D509 Iron deficiency anemia, unspecified: Secondary | ICD-10-CM

## 2018-12-31 MED ORDER — SODIUM CHLORIDE 0.9 % IV SOLN
Freq: Once | INTRAVENOUS | Status: AC
  Administered 2018-12-31: 11:00:00 via INTRAVENOUS
  Filled 2018-12-31: qty 1000

## 2018-12-31 NOTE — Progress Notes (Signed)
Spoke w/ Dr. Burr Medico, pt to receive MVI today without copper. Patient will likely require copper in her MVI about every 6 months. No iron infusion needed today.  Demetrius Charity, PharmD, [facility] Pharmac[facility] Phone: [phone] 12/31/2018

## 2019-03-18 NOTE — Progress Notes (Signed)
[place]   [phone] [phone]   Clinic Follow up Note   Patient Care Team: Maurice Small, MD as PCP - General (Family Medicine)  Date of Service:  03/22/2019  CHIEF COMPLAINT: F/u of anemia and Vitamin deficiencies   CURRENT THERAPY:  Feraheme, as needed, MVI and copper IV replacement as needed started in Aug 2016   INTERVAL HISTORY:  Mackenzie [name] is here for a follow up of anemia. She presents to the clinic alone. She notes she is doing well. She notes she has been craving cigarettes and sweets. She has changed her oral iron 4 tablet of Heme iron which is 12mg  each. She notes still not having periods since her ablation. She notes she is taking copper and zinc supplements together.     REVIEW OF SYSTEMS:   Constitutional: Denies fevers, chills or abnormal weight loss Eyes: Denies blurriness of vision Ears, nose, mouth, throat, and face: Denies mucositis or sore throat Respiratory: Denies cough, dyspnea or wheezes Cardiovascular: Denies palpitation, chest discomfort or lower extremity swelling Gastrointestinal:  Denies nausea, heartburn or change in bowel habits Skin: Denies abnormal skin rashes Lymphatics: Denies new lymphadenopathy or easy bruising Neurological:Denies numbness, tingling or new weaknesses Behavioral/Psych: Mood is stable, no new changes  All other systems were reviewed with the patient and are negative.  MEDICAL HISTORY:  Past Medical History:  Diagnosis Date  . Anemia    history  . Blind 1994  . Blood transfusion 04/2010   [facility] - iron transfusion per pt  . Breast lump left  . Depression    no meds  . H/O hiatal hernia    repaired with weight loss surgery  . History of uterine fibroid   . 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 Pseudotumor cerebri 1994   History - caused blind    SURGICAL HISTORY: Past Surgical History:  Procedure  Laterality Date  . APPENDECTOMY     removed with switch surgery  . BREAST SURGERY  2012   right breast lumpectomy  . CHOLECYSTECTOMY     removed with switch surgery  . DILITATION & CURRETTAGE/HYSTROSCOPY WITH HYDROTHERMAL ABLATION N/A 12/16/2018   Procedure: DILATATION & CURETTAGE/HYSTEROSCOPY WITH HYDROTHERMAL ABLATION;  Surgeon: Janyth Pupa, DO;  Location: [facility];  Service: Gynecology;  Laterality: N/A;  . eye decompression x3  1994   Bilateral   . FETAL BLOOD TRANSFUSION  jan 29,2012  . lp shunt  1994   removed in 2009  . MYOMECTOMY    . rurod switch  2007   weight loss surgery done in [place] (duodenal switch)    I have reviewed the social history and family history with the patient and they are unchanged from previous note.  ALLERGIES:  is allergic to influenza vaccines.  MEDICATIONS:  Current Outpatient Medications  Medication Sig Dispense Refill  . Calcium Carbonate (CALTRATE 600 PO) Take 2 tablets by mouth 2 (two) times daily.    . Cholecalciferol (VITAMIN D3 PO) Take 100,000 Units by mouth 2 (two) times daily.     . Cyanocobalamin (VITAMIN B-12 PO) Take 1 tablet by mouth daily.    . Iron Heme Polypeptide 12 MG TABS Take by mouth 2 (two) times daily.    . prenatal vitamin w/FE, FA (PRENATAL 1 + 1) 27-1 MG TABS tablet Take 1 tablet by mouth daily at 12 noon.    . Probiotic Product (PROBIOTIC PO) Take  1 capsule by mouth daily.    . sertraline (ZOLOFT) 50 MG tablet Take 50 mg by mouth daily.    . vitamin A 25000 UNIT capsule Take 50,000 Units by mouth daily.    . vitamin E 100 UNIT capsule Take by mouth. Not sure of dose     No current facility-administered medications for this visit.     PHYSICAL EXAMINATION: ECOG PERFORMANCE STATUS: 1 - Symptomatic but completely ambulatory  Vitals:   03/22/19 0912  BP: 125/89  Pulse: 76  Resp: 18  Temp: 98.5 F (36.9 C)  SpO2: 100%   Filed Weights   03/22/19 0912  Weight: 210 lb 6.4 oz (95.4 kg)     GENERAL:alert, no distress and comfortable SKIN: skin color, texture, turgor are normal, no rashes or significant lesions EYES: normal, Conjunctiva are pink and non-injected, sclera clear  NECK: supple, thyroid normal size, non-tender, without nodularity LYMPH:  no palpable lymphadenopathy in the cervical, axillary  LUNGS: clear to auscultation and percussion with normal breathing effort HEART: regular rate & rhythm and no murmurs and no lower extremity edema ABDOMEN:abdomen soft, non-tender and normal bowel sounds Musculoskeletal:no cyanosis of digits and no clubbing  NEURO: alert & oriented x 3 with fluent speech, no focal motor/sensory deficits  LABORATORY DATA:  I have reviewed the data as listed CBC Latest Ref Rng & Units 03/22/2019 12/09/2018 11/25/2018  WBC 4.0 - 10.5 K/uL 8.2 9.2 7.2  Hemoglobin 12.0 - 15.0 g/dL 11.2(L) 12.0 11.8(L)  Hematocrit 36.0 - 46.0 % 35.7(L) 37.5 36.5  Platelets 150 - 400 K/uL 221 250 178     CMP Latest Ref Rng & Units 12/09/2018 11/25/2018 03/08/2016  Glucose 70 - 99 mg/dL 87 75 78  BUN 6 - 20 mg/dL 12 14 15.5  Creatinine 0.44 - 1.00 mg/dL 0.70 0.77 0.7  Sodium 135 - 145 mmol/L 136 138 137  Potassium 3.5 - 5.1 mmol/L 5.0 3.8 4.0  Chloride 98 - 111 mmol/L 101 109 -  CO2 22 - 32 mmol/L 28 23 24   Calcium 8.9 - 10.3 mg/dL 9.0 8.7(L) 9.0  Total Protein 6.5 - 8.1 g/dL 7.5 7.8 7.6  Total Bilirubin 0.3 - 1.2 mg/dL 0.7 0.5 1.05  Alkaline Phos 38 - 126 U/L 63 69 68  AST 15 - 41 U/L 33 38 28  ALT 0 - 44 U/L 24 54(H) 25      RADIOGRAPHIC STUDIES: I have personally reviewed the radiological images as listed and agreed with the findings in the report. No results found.   ASSESSMENT & PLAN:  Mackenzie [name] is a 43 y.o. female with   1. Nutritional anemia -She was diagnosed with iron deficient anemia secondary to her duodenal switch procedure, and heavy menstrual period. She responded well to IV iron. -She now has macrocytic anemia, but the [id number]  and folic acid level are normal. She also has low Cooper and zinc level, which may also contribute to her anemia -Her anemia did not correct completely by IV iron. She likely has as a component of other nutritional deficient anemia, possible secondary to her duodenal switch procedure -She is being treated with oral multivitamin and multivitamin infusion. She is also on oral Heme iron 12mg  4 times daily.  -She underwent D&C ablation on 12/16/18. She has not had period since. -She is clinically doing well and stable. Last IV iron in 09/2018. Labs reviewed, Hg 11.2. Iron panel still pending. May proceed with IV Feraheme this week.  -F/u in 6 months.  2. Other vitamin deficiency  -She has received 600K U Vit D and 50K U VitA at Dr. Lucinda Dell office in 2016, now on high oral dose  -She received multivitamin 30ml (containing vitamin 400 unit, vitamin A 6600 unit) intravenous infusion in 04/2014, 11/2014 and last on 12/17/17, about every 3 months. Responded well.  -She has been receiving Multivitamin Infusion every 3 months if level is low. Will proceed with Infusion later this week if needed. If levels maintained may reduce her to every 6 months.   3. Hypocalcemia -Hypocalcemia could be related to her vitamin D deficiency. She'll continue oral calcium and vitamin D. -She still has leg cramps. I encouraged her to take oral magnesium as well.   4. Menorrhagia  -The IUD was taken out in 10/2017, she has since began having heavy periods again -She underwent C&D ablation on 12/16/18. She has not had period since then.   She will continue to follow-up with her primary care physician for other medical issues.  Plan -Labs reviewed, anemia  Improved, iron study, Vit A/D and copper levels are still pending, will proceed with infusion later this week if needed, goal is to keep ferritin >50, VitA/D and copper level in normal range  -Lab in 3 and 6 months -f/u in 6 months a few days after lab     No  problem-specific Assessment & Plan notes found for this encounter.   No orders of the defined types were placed in this encounter.  All questions were answered. The patient knows to call the clinic with any problems, questions or concerns. No barriers to learning was detected. I spent 15 minutes counseling the patient face to face. The total time spent in the appointment was 20 minutes and more than 50% was on counseling and review of test results     Truitt Merle, MD 03/22/2019   I, Joslyn Devon, am acting as scribe for Truitt Merle, MD.   I have reviewed the above documentation for accuracy and completeness, and I agree with the above.

## 2019-03-22 ENCOUNTER — Encounter: Payer: Self-pay | Admitting: Hematology

## 2019-03-22 ENCOUNTER — Inpatient Hospital Stay: Payer: Medicare HMO

## 2019-03-22 ENCOUNTER — Other Ambulatory Visit: Payer: Self-pay

## 2019-03-22 ENCOUNTER — Inpatient Hospital Stay: Payer: Medicare HMO | Attending: Hematology | Admitting: Hematology

## 2019-03-22 VITALS — BP 125/89 | HR 76 | Temp 98.5°F | Resp 18 | Ht 66.0 in | Wt 210.4 lb

## 2019-03-22 DIAGNOSIS — D509 Iron deficiency anemia, unspecified: Secondary | ICD-10-CM

## 2019-03-22 DIAGNOSIS — E559 Vitamin D deficiency, unspecified: Secondary | ICD-10-CM

## 2019-03-22 DIAGNOSIS — E509 Vitamin A deficiency, unspecified: Secondary | ICD-10-CM

## 2019-03-22 DIAGNOSIS — D500 Iron deficiency anemia secondary to blood loss (chronic): Secondary | ICD-10-CM | POA: Diagnosis not present

## 2019-03-22 DIAGNOSIS — E569 Vitamin deficiency, unspecified: Secondary | ICD-10-CM | POA: Diagnosis not present

## 2019-03-22 DIAGNOSIS — N920 Excessive and frequent menstruation with regular cycle: Secondary | ICD-10-CM | POA: Diagnosis not present

## 2019-03-22 DIAGNOSIS — R252 Cramp and spasm: Secondary | ICD-10-CM | POA: Insufficient documentation

## 2019-03-22 DIAGNOSIS — Z79899 Other long term (current) drug therapy: Secondary | ICD-10-CM | POA: Insufficient documentation

## 2019-03-22 DIAGNOSIS — D649 Anemia, unspecified: Secondary | ICD-10-CM

## 2019-03-22 LAB — CBC WITH DIFFERENTIAL (CANCER CENTER ONLY)
Abs Immature Granulocytes: 0.02 10*3/uL (ref 0.00–0.07)
Basophils Absolute: 0 10*3/uL (ref 0.0–0.1)
Basophils Relative: 0 %
Eosinophils Absolute: 0 10*3/uL (ref 0.0–0.5)
Eosinophils Relative: 0 %
HCT: 35.7 % — ABNORMAL LOW (ref 36.0–46.0)
Hemoglobin: 11.2 g/dL — ABNORMAL LOW (ref 12.0–15.0)
Immature Granulocytes: 0 %
Lymphocytes Relative: 15 %
Lymphs Abs: 1.3 10*3/uL (ref 0.7–4.0)
MCH: 31.8 pg (ref 26.0–34.0)
MCHC: 31.4 g/dL (ref 30.0–36.0)
MCV: 101.4 fL — ABNORMAL HIGH (ref 80.0–100.0)
Monocytes Absolute: 0.6 10*3/uL (ref 0.1–1.0)
Monocytes Relative: 7 %
Neutro Abs: 6.3 10*3/uL (ref 1.7–7.7)
Neutrophils Relative %: 78 %
Platelet Count: 221 10*3/uL (ref 150–400)
RBC: 3.52 MIL/uL — ABNORMAL LOW (ref 3.87–5.11)
RDW: 13.5 % (ref 11.5–15.5)
WBC Count: 8.2 10*3/uL (ref 4.0–10.5)
nRBC: 0 % (ref 0.0–0.2)

## 2019-03-22 LAB — IRON AND TIBC
Iron: 59 ug/dL (ref 41–142)
Saturation Ratios: 18 % — ABNORMAL LOW (ref 21–57)
TIBC: 330 ug/dL (ref 236–444)
UIBC: 271 ug/dL (ref 120–384)

## 2019-03-22 LAB — FERRITIN: Ferritin: 25 ng/mL (ref 11–307)

## 2019-03-22 LAB — VITAMIN D 25 HYDROXY (VIT D DEFICIENCY, FRACTURES): Vit D, 25-Hydroxy: 18.08 ng/mL — ABNORMAL LOW (ref 30–100)

## 2019-03-23 ENCOUNTER — Telehealth: Payer: Self-pay | Admitting: Hematology

## 2019-03-23 ENCOUNTER — Other Ambulatory Visit: Payer: Self-pay

## 2019-03-23 DIAGNOSIS — Z20822 Contact with and (suspected) exposure to covid-19: Secondary | ICD-10-CM

## 2019-03-23 NOTE — Telephone Encounter (Signed)
Scheduled appt per 11/23 los. ° °Spoke with pt and she is aware of the appt date and time. °

## 2019-03-24 ENCOUNTER — Telehealth: Payer: Self-pay

## 2019-03-24 LAB — NOVEL CORONAVIRUS, NAA: SARS-CoV-2, NAA: NOT DETECTED

## 2019-03-24 NOTE — Telephone Encounter (Signed)
Roberto Scales will need both iv iron and MVI infusion. Copper level is still pending, will give if level low. If not back by Friday morning, we can postpone her infusion to next week. Please let pharmacy know,and order MVI and copper infusion if her copper level is below normal.   Thanks   Truitt Merle MD

## 2019-03-24 NOTE — Telephone Encounter (Signed)
Spoke with patient to let her know that her Ferritin was 25 and she needs to keep her appointment on Friday 11/27 for IV iron.  She inquired about the other lab results.  The only thing that is results is Vitamin D which is low at 18.08.  I told her I would send Dr. Burr Medico a message to ask what we need to do about this. She verbalized an understanding.

## 2019-03-25 LAB — COPPER, SERUM: Copper: 96 ug/dL (ref 72–166)

## 2019-03-25 LAB — ZINC: Zinc: 77 ug/dL (ref 56–134)

## 2019-03-26 ENCOUNTER — Other Ambulatory Visit: Payer: Self-pay

## 2019-03-26 ENCOUNTER — Telehealth: Payer: Self-pay | Admitting: Hematology

## 2019-03-26 ENCOUNTER — Inpatient Hospital Stay: Payer: Medicare HMO

## 2019-03-26 LAB — VITAMIN C: Vitamin C: 0.9 mg/dL (ref 0.4–2.0)

## 2019-03-26 NOTE — Progress Notes (Signed)
Received request to enter IVF orders from Dr. Ernestina Penna RN. Patient will receive 1L NS with MVI over 2 hours today. Patient will not receive copper today.  Orders entered as discussed.  Hardie Pulley, PharmD, BCPS, BCOP

## 2019-03-26 NOTE — Telephone Encounter (Signed)
Returned patient's phone call regarding rescheduling 11/27 appointment, per patient's request appointment has moved to 11/30.

## 2019-03-28 LAB — VITAMIN A: Vitamin A (Retinoic Acid): 27.8 ug/dL (ref 20.1–62.0)

## 2019-03-29 ENCOUNTER — Inpatient Hospital Stay: Payer: Medicare HMO

## 2019-03-29 ENCOUNTER — Other Ambulatory Visit: Payer: Self-pay

## 2019-03-29 VITALS — BP 123/70 | HR 90 | Temp 99.0°F | Resp 18

## 2019-03-29 DIAGNOSIS — D509 Iron deficiency anemia, unspecified: Secondary | ICD-10-CM

## 2019-03-29 DIAGNOSIS — E569 Vitamin deficiency, unspecified: Secondary | ICD-10-CM | POA: Diagnosis not present

## 2019-03-29 DIAGNOSIS — D500 Iron deficiency anemia secondary to blood loss (chronic): Secondary | ICD-10-CM | POA: Diagnosis not present

## 2019-03-29 DIAGNOSIS — N920 Excessive and frequent menstruation with regular cycle: Secondary | ICD-10-CM | POA: Diagnosis not present

## 2019-03-29 DIAGNOSIS — Z79899 Other long term (current) drug therapy: Secondary | ICD-10-CM | POA: Diagnosis not present

## 2019-03-29 DIAGNOSIS — R252 Cramp and spasm: Secondary | ICD-10-CM | POA: Diagnosis not present

## 2019-03-29 MED ORDER — SODIUM CHLORIDE 0.9 % IV SOLN
Freq: Once | INTRAVENOUS | Status: AC
  Administered 2019-03-29: 15:00:00 via INTRAVENOUS
  Filled 2019-03-29: qty 1000

## 2019-03-29 MED ORDER — SODIUM CHLORIDE 0.9 % IV SOLN
Freq: Once | INTRAVENOUS | Status: AC
  Administered 2019-03-29: 14:00:00 via INTRAVENOUS
  Filled 2019-03-29: qty 250

## 2019-03-29 MED ORDER — SODIUM CHLORIDE 0.9 % IV SOLN
510.0000 mg | Freq: Once | INTRAVENOUS | Status: AC
  Administered 2019-03-29: 510 mg via INTRAVENOUS
  Filled 2019-03-29: qty 17

## 2019-03-29 NOTE — Patient Instructions (Signed)

## 2019-04-13 ENCOUNTER — Other Ambulatory Visit: Payer: Self-pay | Admitting: Obstetrics & Gynecology

## 2019-04-13 DIAGNOSIS — Z1231 Encounter for screening mammogram for malignant neoplasm of breast: Secondary | ICD-10-CM

## 2019-04-13 DIAGNOSIS — L80 Vitiligo: Secondary | ICD-10-CM | POA: Diagnosis not present

## 2019-04-13 DIAGNOSIS — D171 Benign lipomatous neoplasm of skin and subcutaneous tissue of trunk: Secondary | ICD-10-CM | POA: Diagnosis not present

## 2019-04-13 DIAGNOSIS — Z9889 Other specified postprocedural states: Secondary | ICD-10-CM | POA: Diagnosis not present

## 2019-04-13 DIAGNOSIS — Z01411 Encounter for gynecological examination (general) (routine) with abnormal findings: Secondary | ICD-10-CM | POA: Diagnosis not present

## 2019-04-20 IMAGING — US ULTRASOUND RIGHT BREAST LIMITED
1 series · 6 of 6 positions shown · non-contrast
Comparison: Previous exam(s).

CLINICAL DATA: 42-year-old female presenting for evaluation a
palpable lump in the infraclavicular region of the left breast. She
has history of an excisional biopsy of a left breast
fibroadenoma.She also has family history of breast cancer in her
mother.

EXAM:
DIGITAL DIAGNOSTIC BILATERAL MAMMOGRAM WITH CAD AND TOMO
ULTRASOUND BILATERAL BREAST

[Series 1: ultrasound right breast limited · 0.09mm/px · 6 of 6 slices shown]
[im 1/6]
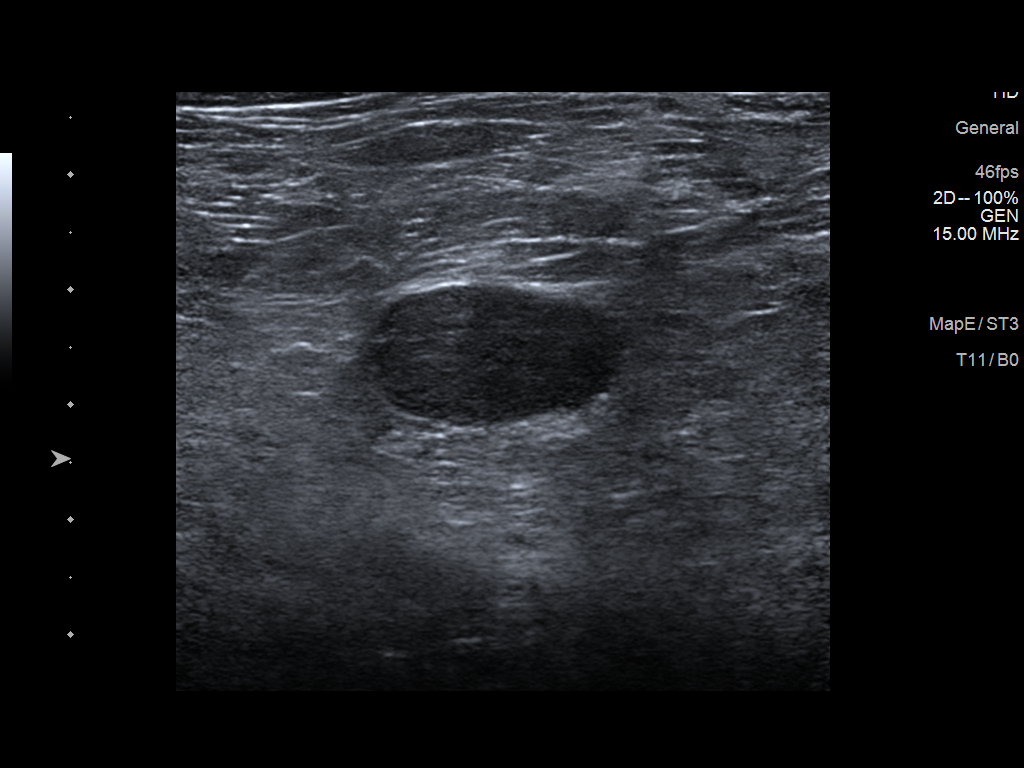
[im 2/6]
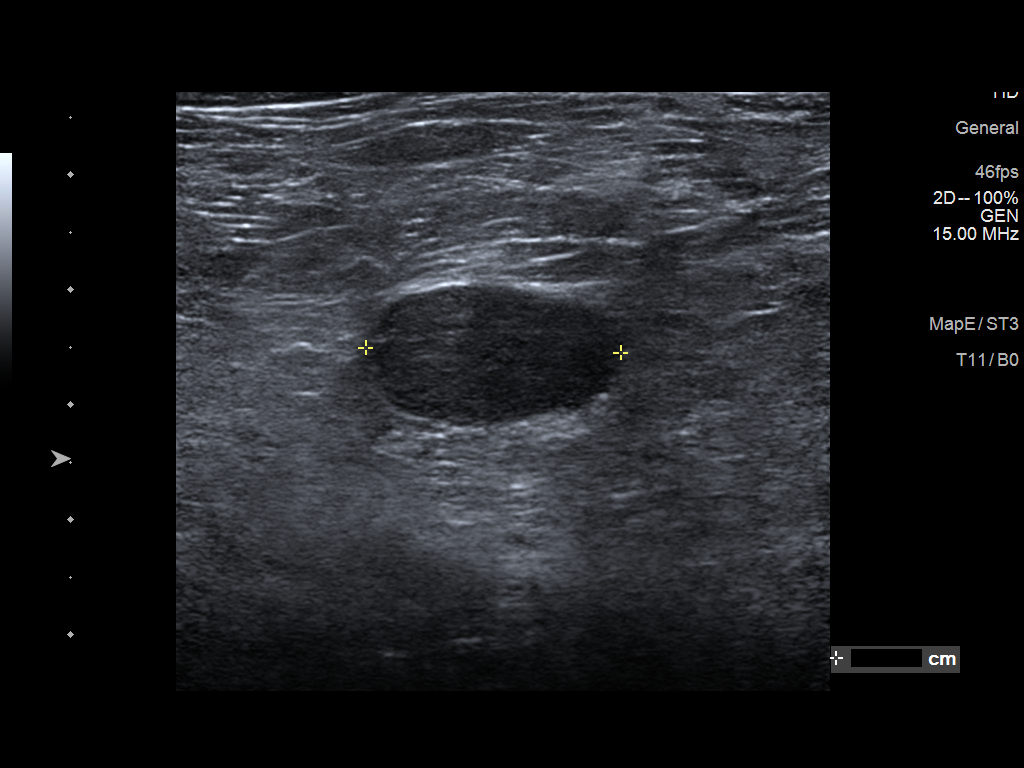
[im 3/6]
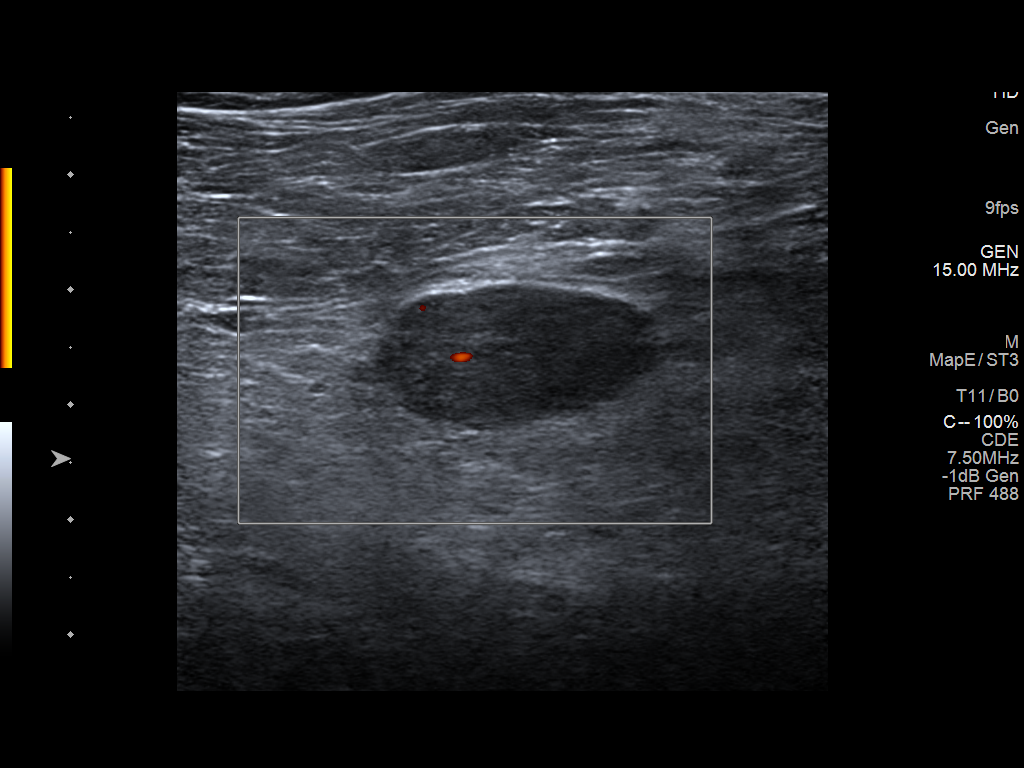
[im 4/6]
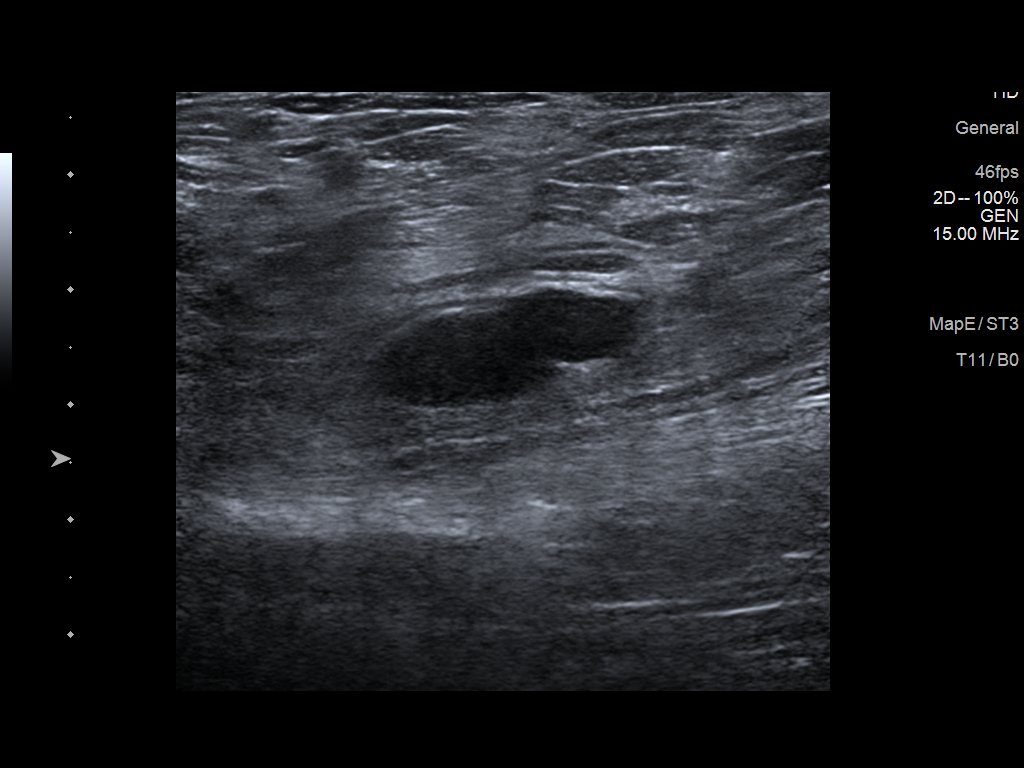
[im 5/6]
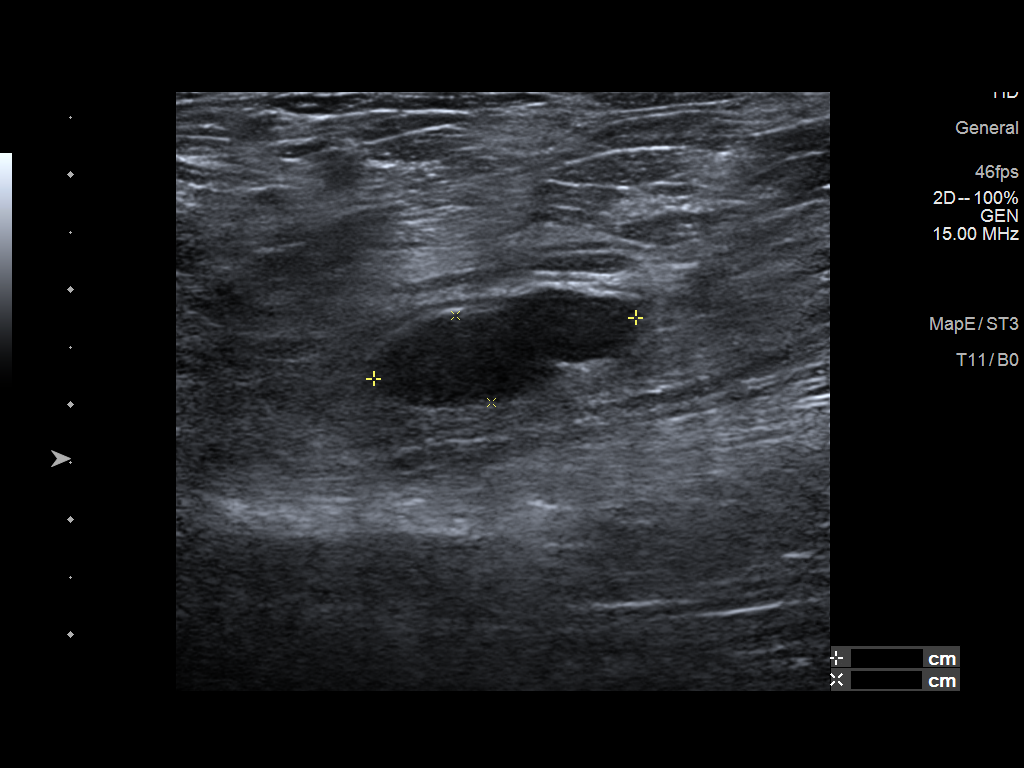
[im 6/6]
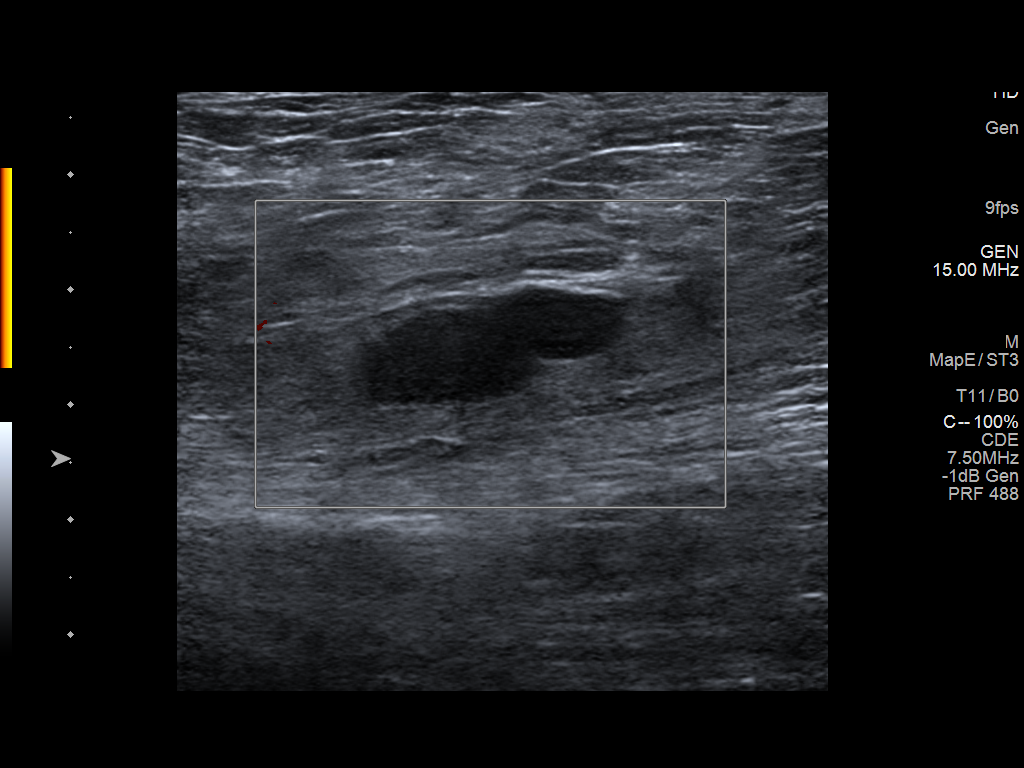

[6 of 6 positions shown; findings below may reference images not displayed]

ACR Breast Density Category c: The breast tissue is heterogeneously
dense, which may obscure small masses.
FINDINGS: There are multiple bilateral new masses in the breasts, several of
which have circumscribed margins, favored to represent
fibroadenomas. Ultrasound will be performed for the largest mass in
the medial which measures approximately 3 cm, and 1 mass in the far
posterior central left breast which has indistinct margins
mammographically. A BB has been placed at the palpable site of
concern along the superior infraclavicular left breast. No
suspicious mammographic changes are identified deep to the palpable
marker. No other suspicious calcifications, masses or areas of
distortion are seen in the bilateral breasts.

Mammographic images were processed with CAD.

On physical exam, there is a broad soft mobile mass in the left
breast at 1 o'clock near the clavicle spanning at least 5 cm.

Ultrasound of the left breast at 1 o'clock, 24 cm from the nipple
demonstrates a uniformly isoechoic circumscribed mass measuring
approximately 6.4 x 2.7 x 6.1 cm.

In the left breast at 2 o'clock, 13 cm from the nipple there is an
oval hypoechoic circumscribed mass measuring 2.1 x 0.6 x 1.8 cm.

In the right breast at 2 o'clock, there is a circumscribed oval
hypoechoic mass measuring 2.3 x 0.8 x 2.2 cm.
IMPRESSION: 1. The palpable mass in the left breast near the clavicle
corresponds with a benign lipoma.

2. There are multiple bilateral masses in both breast consistent
with fibroadenomas. Two of these were scanned by ultrasound which
are consistent with the appearance of a fibroadenoma. This is a
benign finding.

3.  No mammographic evidence of malignancy in the bilateral breasts.

RECOMMENDATION:
Screening mammogram in one year.(Code:8Y-7-COY)

I have discussed the findings and recommendations with the patient.
Results were also provided in writing at the conclusion of the
visit. If applicable, a reminder letter will be sent to the patient
regarding the next appointment.

BI-RADS CATEGORY  2: Benign.

## 2019-04-20 IMAGING — US ULTRASOUND LEFT BREAST LIMITED
1 series · 10 of 10 positions shown · non-contrast
Comparison: Previous exam(s).

CLINICAL DATA: 42-year-old female presenting for evaluation a
palpable lump in the infraclavicular region of the left breast. She
has history of an excisional biopsy of a left breast
fibroadenoma.She also has family history of breast cancer in her
mother.

EXAM:
DIGITAL DIAGNOSTIC BILATERAL MAMMOGRAM WITH CAD AND TOMO
ULTRASOUND BILATERAL BREAST

[Series 1: ultrasound left breast limited · 0.07mm/px · 10 of 10 slices shown]
[im 1/10]
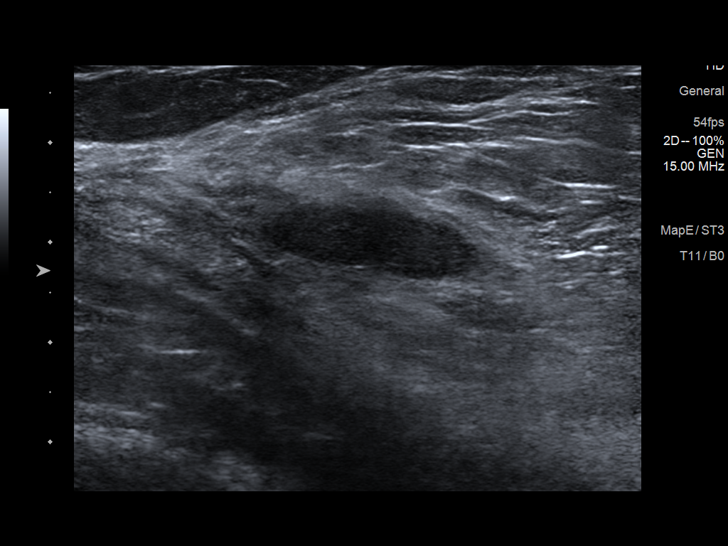
[im 2/10]
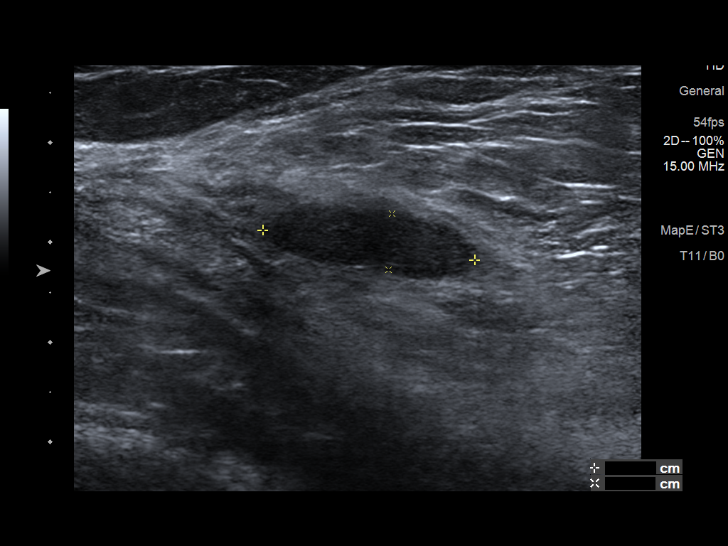
[im 3/10]
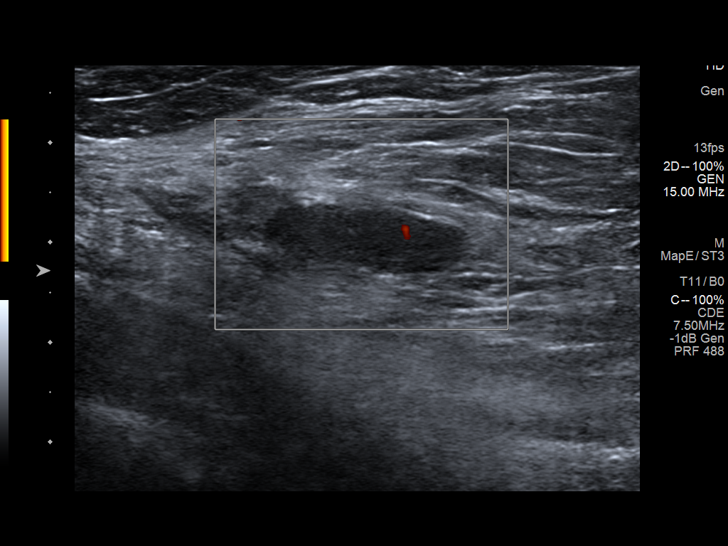
[im 4/10]
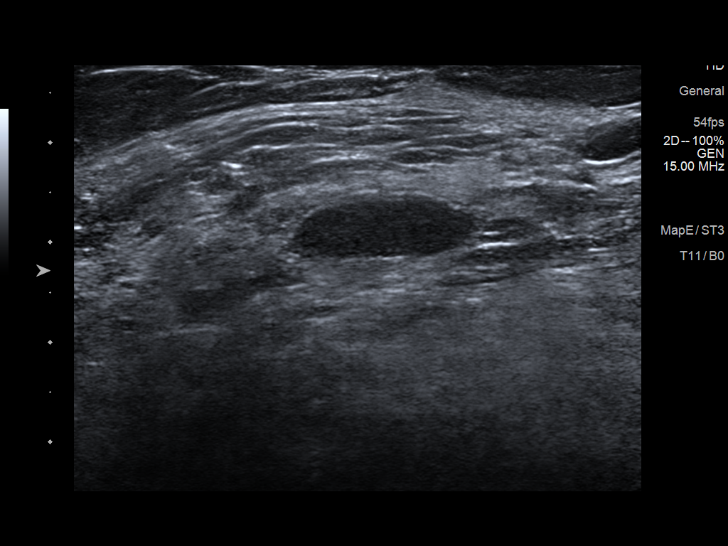
[im 5/10]
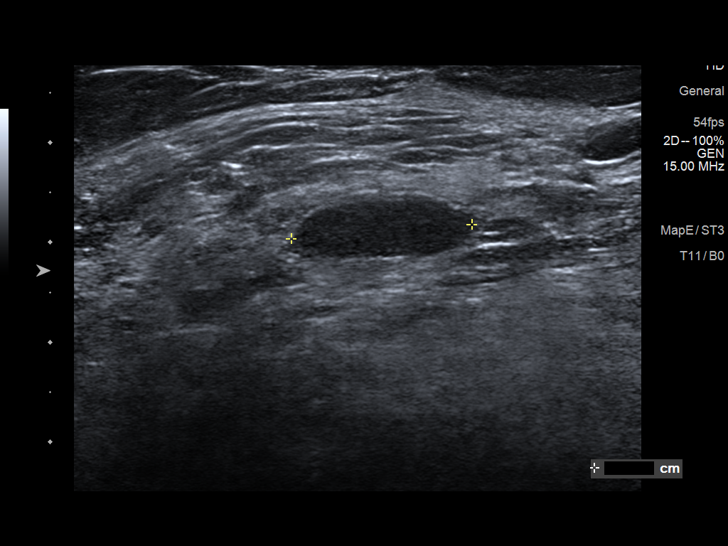
[im 6/10]
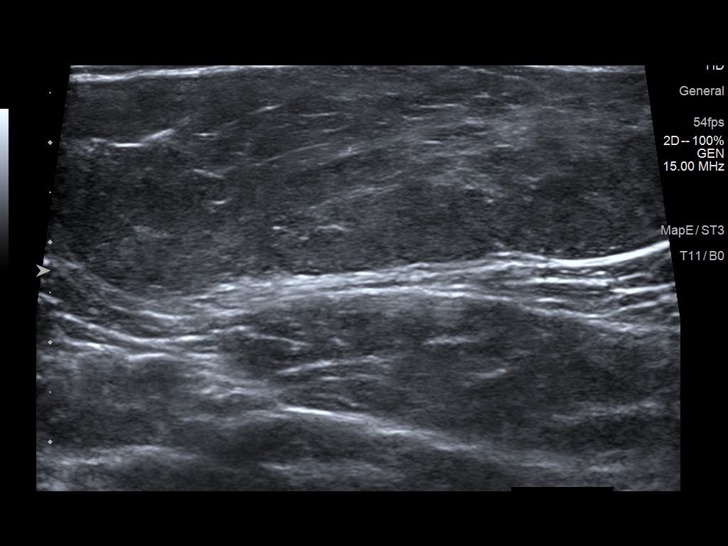
[im 7/10]
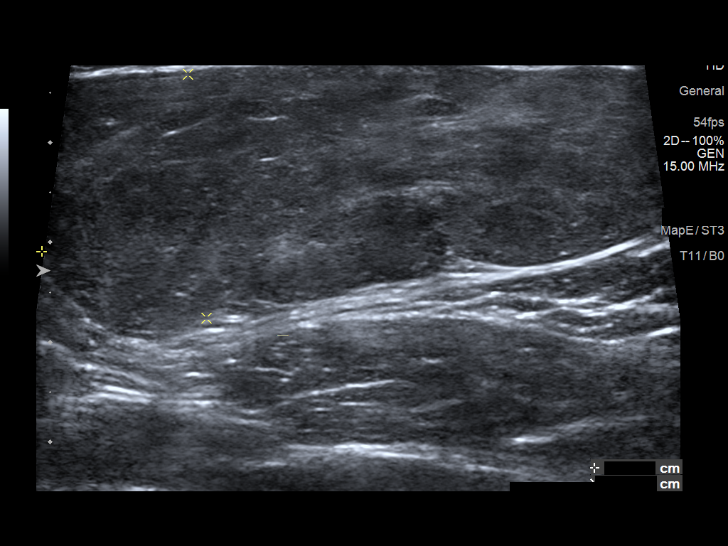
[im 8/10]
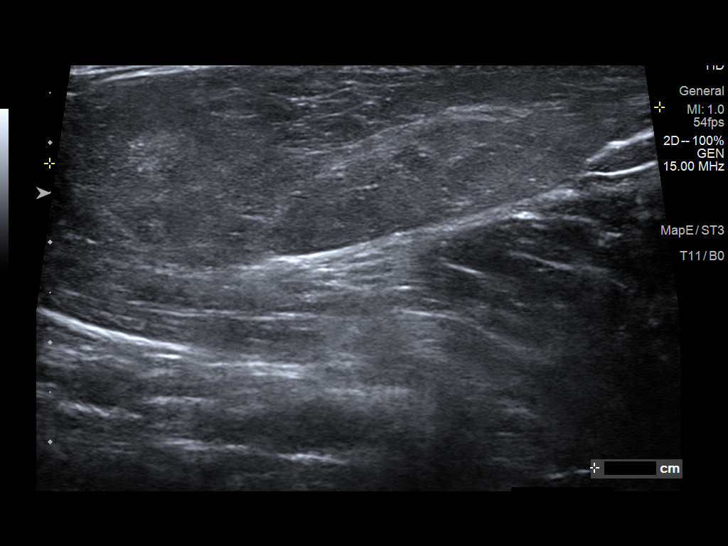
[im 9/10]
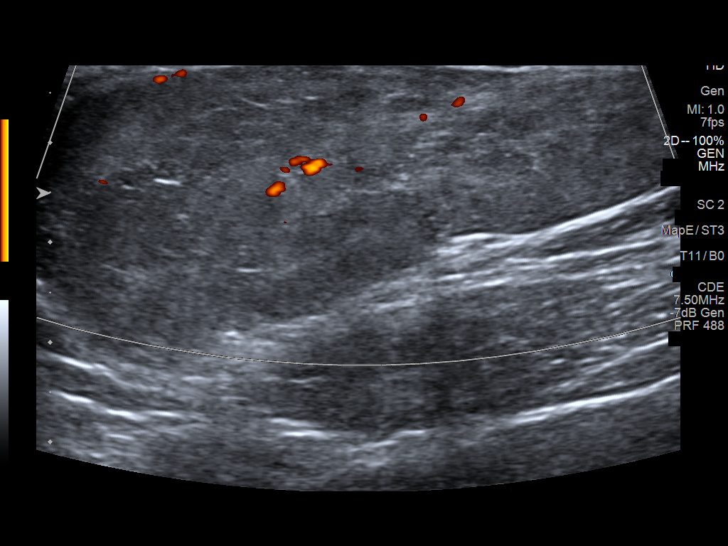
[im 10/10]
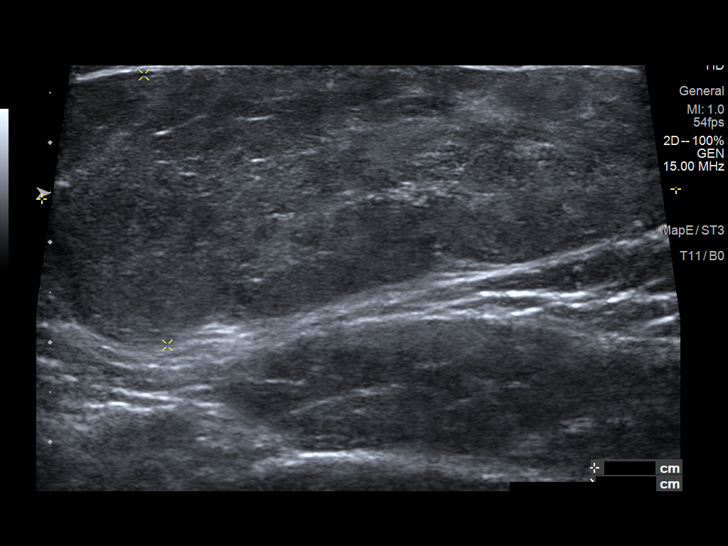

[10 of 10 positions shown; findings below may reference images not displayed]

ACR Breast Density Category c: The breast tissue is heterogeneously
dense, which may obscure small masses.
FINDINGS: There are multiple bilateral new masses in the breasts, several of
which have circumscribed margins, favored to represent
fibroadenomas. Ultrasound will be performed for the largest mass in
the medial which measures approximately 3 cm, and 1 mass in the far
posterior central left breast which has indistinct margins
mammographically. A BB has been placed at the palpable site of
concern along the superior infraclavicular left breast. No
suspicious mammographic changes are identified deep to the palpable
marker. No other suspicious calcifications, masses or areas of
distortion are seen in the bilateral breasts.

Mammographic images were processed with CAD.

On physical exam, there is a broad soft mobile mass in the left
breast at 1 o'clock near the clavicle spanning at least 5 cm.

Ultrasound of the left breast at 1 o'clock, 24 cm from the nipple
demonstrates a uniformly isoechoic circumscribed mass measuring
approximately 6.4 x 2.7 x 6.1 cm.

In the left breast at 2 o'clock, 13 cm from the nipple there is an
oval hypoechoic circumscribed mass measuring 2.1 x 0.6 x 1.8 cm.

In the right breast at 2 o'clock, there is a circumscribed oval
hypoechoic mass measuring 2.3 x 0.8 x 2.2 cm.
IMPRESSION: 1. The palpable mass in the left breast near the clavicle
corresponds with a benign lipoma.

2. There are multiple bilateral masses in both breast consistent
with fibroadenomas. Two of these were scanned by ultrasound which
are consistent with the appearance of a fibroadenoma. This is a
benign finding.

3.  No mammographic evidence of malignancy in the bilateral breasts.

RECOMMENDATION:
Screening mammogram in one year.(Code:8Y-7-COY)

I have discussed the findings and recommendations with the patient.
Results were also provided in writing at the conclusion of the
visit. If applicable, a reminder letter will be sent to the patient
regarding the next appointment.

BI-RADS CATEGORY  2: Benign.

## 2019-04-21 DIAGNOSIS — R152 Fecal urgency: Secondary | ICD-10-CM | POA: Diagnosis not present

## 2019-04-21 DIAGNOSIS — R159 Full incontinence of feces: Secondary | ICD-10-CM | POA: Diagnosis not present

## 2019-04-21 DIAGNOSIS — N3281 Overactive bladder: Secondary | ICD-10-CM | POA: Diagnosis not present

## 2019-05-04 ENCOUNTER — Other Ambulatory Visit: Payer: Self-pay

## 2019-05-04 ENCOUNTER — Encounter (HOSPITAL_COMMUNITY): Payer: Self-pay | Admitting: Emergency Medicine

## 2019-05-04 ENCOUNTER — Emergency Department (HOSPITAL_COMMUNITY)
Admission: EM | Admit: 2019-05-04 | Discharge: 2019-05-04 | Disposition: A | Discharge status: Home / Self Care | Payer: Medicare HMO | Attending: Emergency Medicine | Admitting: Emergency Medicine

## 2019-05-04 DIAGNOSIS — R1110 Vomiting, unspecified: Secondary | ICD-10-CM | POA: Diagnosis not present

## 2019-05-04 DIAGNOSIS — Z5321 Procedure and treatment not carried out due to patient leaving prior to being seen by health care provider: Secondary | ICD-10-CM | POA: Diagnosis not present

## 2019-05-04 LAB — CBC
HCT: 41.6 % (ref 36.0–46.0)
Hemoglobin: 13.3 g/dL (ref 12.0–15.0)
MCH: 32.6 pg (ref 26.0–34.0)
MCHC: 32 g/dL (ref 30.0–36.0)
MCV: 102 fL — ABNORMAL HIGH (ref 80.0–100.0)
Platelets: 170 10*3/uL (ref 150–400)
RBC: 4.08 MIL/uL (ref 3.87–5.11)
RDW: 12.4 % (ref 11.5–15.5)
WBC: 18.8 10*3/uL — ABNORMAL HIGH (ref 4.0–10.5)
nRBC: 0 % (ref 0.0–0.2)

## 2019-05-04 LAB — URINALYSIS, ROUTINE W REFLEX MICROSCOPIC
Bilirubin Urine: NEGATIVE
Glucose, UA: NEGATIVE mg/dL
Hgb urine dipstick: NEGATIVE
Ketones, ur: 5 mg/dL — AB
Leukocytes,Ua: NEGATIVE
Nitrite: NEGATIVE
Protein, ur: NEGATIVE mg/dL
Specific Gravity, Urine: 1.026 (ref 1.005–1.030)
pH: 5 (ref 5.0–8.0)

## 2019-05-04 LAB — COMPREHENSIVE METABOLIC PANEL
ALT: 22 U/L (ref 0–44)
AST: 27 U/L (ref 15–41)
Albumin: 4.4 g/dL (ref 3.5–5.0)
Alkaline Phosphatase: 53 U/L (ref 38–126)
Anion gap: 8 (ref 5–15)
BUN: 21 mg/dL — ABNORMAL HIGH (ref 6–20)
CO2: 17 mmol/L — ABNORMAL LOW (ref 22–32)
Calcium: 8.2 mg/dL — ABNORMAL LOW (ref 8.9–10.3)
Chloride: 110 mmol/L (ref 98–111)
Creatinine, Ser: 0.77 mg/dL (ref 0.44–1.00)
GFR calc Af Amer: >60 mL/min (ref >60)
GFR calc non Af Amer: >60 mL/min (ref >60)
Glucose, Bld: 130 mg/dL — ABNORMAL HIGH (ref 70–99)
Potassium: 3.7 mmol/L (ref 3.5–5.1)
Sodium: 135 mmol/L (ref 135–145)
Total Bilirubin: 0.9 mg/dL (ref 0.3–1.2)
Total Protein: 7.8 g/dL (ref 6.5–8.1)

## 2019-05-04 LAB — I-STAT BETA HCG BLOOD, ED (MC, WL, AP ONLY): I-stat hCG, quantitative: <5 m[IU]/mL

## 2019-05-04 LAB — LIPASE, BLOOD: Lipase: 25 U/L (ref 11–51)

## 2019-05-04 MED ORDER — SODIUM CHLORIDE 0.9% FLUSH: 3.0000 mL | Freq: Once | INTRAVENOUS | Status: DC

## 2019-05-04 MED ORDER — ONDANSETRON 4 MG PO TBDP
4.0000 mg | ORAL_TABLET | Freq: Once | ORAL | Status: AC | PRN
  Administered 2019-05-04: 4 mg via ORAL
  Filled 2019-05-04: qty 1

## 2019-05-04 NOTE — ED Triage Notes (Signed)
Pt report eating around 2100 then at 2200 vomiting and cramps started. Pt also started with diarrhea.

## 2019-05-04 NOTE — ED Notes (Signed)
Pt stated that she was going to call her Physician in the morning Pt and mother left Pt ambulatory out of the department

## 2019-05-07 DIAGNOSIS — K5792 Diverticulitis of intestine, part unspecified, without perforation or abscess without bleeding: Secondary | ICD-10-CM | POA: Diagnosis not present

## 2019-05-10 DIAGNOSIS — K5792 Diverticulitis of intestine, part unspecified, without perforation or abscess without bleeding: Secondary | ICD-10-CM | POA: Diagnosis not present

## 2019-06-01 ENCOUNTER — Ambulatory Visit
Admission: RE | Admit: 2019-06-01 | Discharge: 2019-06-01 | Disposition: A | Discharge status: Home / Self Care | Payer: Medicare HMO | Source: Ambulatory Visit | Attending: Obstetrics & Gynecology | Admitting: Obstetrics & Gynecology

## 2019-06-01 ENCOUNTER — Other Ambulatory Visit: Payer: Self-pay

## 2019-06-01 DIAGNOSIS — Z1231 Encounter for screening mammogram for malignant neoplasm of breast: Secondary | ICD-10-CM

## 2019-06-02 ENCOUNTER — Other Ambulatory Visit: Payer: Self-pay | Admitting: Obstetrics & Gynecology

## 2019-06-02 DIAGNOSIS — R928 Other abnormal and inconclusive findings on diagnostic imaging of breast: Secondary | ICD-10-CM

## 2019-06-08 DIAGNOSIS — Z03818 Encounter for observation for suspected exposure to other biological agents ruled out: Secondary | ICD-10-CM | POA: Diagnosis not present

## 2019-06-09 DIAGNOSIS — N3281 Overactive bladder: Secondary | ICD-10-CM | POA: Diagnosis not present

## 2019-06-13 DIAGNOSIS — Z1152 Encounter for screening for COVID-19: Secondary | ICD-10-CM | POA: Diagnosis not present

## 2019-06-16 ENCOUNTER — Other Ambulatory Visit: Payer: Medicare HMO

## 2019-06-22 ENCOUNTER — Telehealth: Payer: Self-pay | Admitting: Hematology

## 2019-06-22 ENCOUNTER — Inpatient Hospital Stay: Payer: Medicare HMO | Attending: Hematology

## 2019-06-22 NOTE — Telephone Encounter (Signed)
Called pt per 2/22 sch message - unable to reach pt . Left message for patient to call back to reschedule appt.

## 2019-06-25 ENCOUNTER — Ambulatory Visit
Admission: RE | Admit: 2019-06-25 | Discharge: 2019-06-25 | Disposition: A | Discharge status: Home / Self Care | Payer: Medicare HMO | Source: Ambulatory Visit | Attending: Obstetrics & Gynecology | Admitting: Obstetrics & Gynecology

## 2019-06-25 ENCOUNTER — Other Ambulatory Visit: Payer: Self-pay | Admitting: Obstetrics & Gynecology

## 2019-06-25 ENCOUNTER — Other Ambulatory Visit: Payer: Self-pay

## 2019-06-25 DIAGNOSIS — N6322 Unspecified lump in the left breast, upper inner quadrant: Secondary | ICD-10-CM | POA: Diagnosis not present

## 2019-06-25 DIAGNOSIS — R928 Other abnormal and inconclusive findings on diagnostic imaging of breast: Secondary | ICD-10-CM

## 2019-06-25 DIAGNOSIS — N6321 Unspecified lump in the left breast, upper outer quadrant: Secondary | ICD-10-CM | POA: Diagnosis not present

## 2019-06-25 DIAGNOSIS — N6310 Unspecified lump in the right breast, unspecified quadrant: Secondary | ICD-10-CM

## 2019-07-09 ENCOUNTER — Ambulatory Visit
Admission: RE | Admit: 2019-07-09 | Discharge: 2019-07-09 | Disposition: A | Discharge status: Home / Self Care | Payer: Medicare HMO | Source: Ambulatory Visit | Attending: Obstetrics & Gynecology | Admitting: Obstetrics & Gynecology

## 2019-07-09 ENCOUNTER — Other Ambulatory Visit: Payer: Self-pay

## 2019-07-09 DIAGNOSIS — N6310 Unspecified lump in the right breast, unspecified quadrant: Secondary | ICD-10-CM

## 2019-07-09 DIAGNOSIS — N6311 Unspecified lump in the right breast, upper outer quadrant: Secondary | ICD-10-CM | POA: Diagnosis not present

## 2019-07-09 DIAGNOSIS — D241 Benign neoplasm of right breast: Secondary | ICD-10-CM | POA: Diagnosis not present

## 2019-07-09 DIAGNOSIS — N6011 Diffuse cystic mastopathy of right breast: Secondary | ICD-10-CM | POA: Diagnosis not present

## 2019-07-09 HISTORY — PX: BREAST BIOPSY: SHX20

## 2019-07-20 ENCOUNTER — Inpatient Hospital Stay: Payer: Medicare HMO | Attending: Hematology

## 2019-07-20 ENCOUNTER — Other Ambulatory Visit: Payer: Self-pay

## 2019-07-20 DIAGNOSIS — D649 Anemia, unspecified: Secondary | ICD-10-CM | POA: Diagnosis not present

## 2019-07-20 DIAGNOSIS — E559 Vitamin D deficiency, unspecified: Secondary | ICD-10-CM | POA: Diagnosis not present

## 2019-07-20 DIAGNOSIS — E610 Copper deficiency: Secondary | ICD-10-CM | POA: Insufficient documentation

## 2019-07-20 DIAGNOSIS — E509 Vitamin A deficiency, unspecified: Secondary | ICD-10-CM | POA: Insufficient documentation

## 2019-07-20 DIAGNOSIS — D509 Iron deficiency anemia, unspecified: Secondary | ICD-10-CM

## 2019-07-20 LAB — CBC WITH DIFFERENTIAL (CANCER CENTER ONLY)
Abs Immature Granulocytes: 0.02 10*3/uL (ref 0.00–0.07)
Basophils Absolute: 0 10*3/uL (ref 0.0–0.1)
Basophils Relative: 0 %
Eosinophils Absolute: 0 10*3/uL (ref 0.0–0.5)
Eosinophils Relative: 0 %
HCT: 41.2 % (ref 36.0–46.0)
Hemoglobin: 13.3 g/dL (ref 12.0–15.0)
Immature Granulocytes: 0 %
Lymphocytes Relative: 13 %
Lymphs Abs: 1.3 10*3/uL (ref 0.7–4.0)
MCH: 31.4 pg (ref 26.0–34.0)
MCHC: 32.3 g/dL (ref 30.0–36.0)
MCV: 97.4 fL (ref 80.0–100.0)
Monocytes Absolute: 0.7 10*3/uL (ref 0.1–1.0)
Monocytes Relative: 7 %
Neutro Abs: 8.3 10*3/uL — ABNORMAL HIGH (ref 1.7–7.7)
Neutrophils Relative %: 80 %
Platelet Count: 191 10*3/uL (ref 150–400)
RBC: 4.23 MIL/uL (ref 3.87–5.11)
RDW: 12 % (ref 11.5–15.5)
WBC Count: 10.4 10*3/uL (ref 4.0–10.5)
nRBC: 0 % (ref 0.0–0.2)

## 2019-07-20 LAB — IRON AND TIBC
Iron: 142 ug/dL (ref 41–142)
Saturation Ratios: 44 % (ref 21–57)
TIBC: 326 ug/dL (ref 236–444)
UIBC: 184 ug/dL (ref 120–384)

## 2019-07-20 LAB — VITAMIN D 25 HYDROXY (VIT D DEFICIENCY, FRACTURES): Vit D, 25-Hydroxy: 18.41 ng/mL — ABNORMAL LOW (ref 30–100)

## 2019-07-20 LAB — FERRITIN: Ferritin: 208 ng/mL (ref 11–307)

## 2019-07-22 LAB — ZINC: Zinc: 82 ug/dL (ref 44–115)

## 2019-07-22 LAB — COPPER, SERUM: Copper: 109 ug/dL (ref 80–158)

## 2019-07-24 LAB — VITAMIN C: Vitamin C: 1.2 mg/dL (ref 0.4–2.0)

## 2019-07-25 LAB — VITAMIN A: Vitamin A (Retinoic Acid): 40.9 ug/dL (ref 20.1–62.0)

## 2019-07-26 ENCOUNTER — Ambulatory Visit: Payer: Self-pay | Admitting: Surgery

## 2019-07-26 DIAGNOSIS — D171 Benign lipomatous neoplasm of skin and subcutaneous tissue of trunk: Secondary | ICD-10-CM | POA: Diagnosis not present

## 2019-07-26 DIAGNOSIS — D241 Benign neoplasm of right breast: Secondary | ICD-10-CM | POA: Diagnosis not present

## 2019-07-26 NOTE — H&P (Signed)
Mackenzie [name] Documented: 07/26/2019 10:21 AM Location: [facility] Patient #: [id number] DOB: 11-Jan-1976 Single / Language: Mackenzie [name] / Race: Black or African American Female  History of Present Illness [name] A. [name] MD; 07/26/2019 10:46 AM) Patient words: Patient presents for evaluation of left upper chest lipoma. She has had it for many years but is getting bigger and causing discomfort and interfering with her physical activity levels. There's been no bleeding, drainage and the area is been smooth and soft. History of right breast fibroadenoma core biopsy benign. She has no interest in excision of this area. She has a history of duodenal switch.            Diagnosis 1. Breast, right, needle core biopsy, 9:30 o'clock - FIBROADENOMA - NO MALIGNANCY IDENTIFIED 2. Breast, right, needle core biopsy, 11:30 o'clock - FIBROCYSTIC CHANGES INCLUDING APOCRINE METAPLASIA - PSEUDOANGIOMATOUS STROMAL HYPERPLASIA - ADENOSIS - NO MALIGNANCY IDENTIFIED Microscopic Comment 1. Dr. Lyndon Code reviewed the case and agrees with the above diagnosis. These results were called to [facility] on July 12, 2019. [name] MD Pathologist, Electronic Signature (Case signed 07/12/2019) Specimen Gross and Clinical Information Specimen Comment 1. Masses 2. Masses Specimen(s) Obtained: 1. Breast, right, needle core biopsy, 9:30 o'clock 2. Breast, right, needle core biopsy, 11:30 o'clock            CLINICAL DATA: Screening recall for bilateral breast masses.  EXAM: DIGITAL DIAGNOSTIC BILATERAL MAMMOGRAM WITH CAD AND TOMO  ULTRASOUND BILATERAL BREAST  COMPARISON: Previous exam(s).  ACR Breast Density Category b: There are scattered areas of fibroglandular density.  FINDINGS: In the lateral aspect of the left breast, there is a persistent mass which is ill-defined mammographically. This is favored to represent the mass seen in the left breast  on ultrasound from 02/09/2018. In the lateral posterior right breast there is a lobulated mass, increased in size from prior. There is a new asymmetry in the superior central right breast. This is not as prominent on the spot compression tomosynthesis images.  Mammographic images were processed with CAD.  Ultrasound of the left breast at 2 o'clock, 13 cm from the nipple demonstrates a stable oval hypoechoic circumscribed mass measuring 2.1 x 0.5 x 1.7 cm, previously 2.1 x 0.6 x 1.8 cm. Adjacent to this at 2 o'clock, 13 cm from the nipple is a small oval hypoechoic mass measuring 0.9 x 0.3 x 0.7 cm.  Ultrasound of the right breast at 930, 18 cm from the nipple demonstrates a lobulated oval hypoechoic mass with indistinct margins measuring 2.1 x 1.0 x 1.7 cm. At 11:30, 9 cm from the nipple there is an irregular hypoechoic mass measuring 1.2 x 0.7 x 1.0 cm. Ultrasound of the right axilla demonstrates normal-appearing lymph nodes.  IMPRESSION: 1. There is an enlarging mass in the right breast at 9:30.  2. There is an apparent new mass in the right breast at 11:30 which is indeterminate.  3. No evidence of right axillary lymphadenopathy.  4. There is a 0.9 cm likely benign mass in the left breast at 2 o'clock.  5. The larger mass in the left breast at 2 o'clock has been stable for over 2 years, and is therefore benign.  RECOMMENDATION: Ultrasound guided biopsy is recommended for the 2 right breast masses. This has been scheduled for 07/09/2019 at 2:45 p.m.  I have discussed the findings and recommendations with the patient. If applicable, a reminder letter will be sent to the patient regarding the next appointment.  BI-RADS CATEGORY 4: Suspicious.   Electronically Signed By: [name] M.D. On: 06/25/2019 13:10.  The patient is a 44 year old female.   Allergies ([name], CMA; 07/26/2019 10:22 AM) No Known Drug Allergies [04/07/2014]: Allergies  Reconciled  Medication History ([name], [place]; 07/26/2019 10:22 AM) Medications Reconciled    Vitals ([name] CMA; 07/26/2019 10:22 AM) 07/26/2019 10:22 AM Weight: 212.13 lb Height: 66in Body Surface Area: 2.05 m Body Mass Index: 34.24 kg/m  Temp.: 91F  Pulse: 84 (Regular)  BP: 122/72 (Sitting, Left Arm, Standard)        Physical Exam ([name] A. Crosby Bevan MD; 07/26/2019 10:47 AM)  Integumentary Note: Left upper chest as a 5 cm x 6 cm mobile smooth mass consistent with lipoma.  Eye Note: blind  Chest and Lung Exam Note: Work of breathing normal No wheezing  Breast Breast - Left-Symmetric, Non Tender, No Biopsy scars, no Dimpling - Left, No Inflammation, No Lumpectomy scars, No Mastectomy scars, No Peau d' Orange. Breast - Right-Symmetric, Non Tender, No Biopsy scars, no Dimpling - Right, No Inflammation, No Lumpectomy scars, No Mastectomy scars, No Peau d' Orange. Breast Lump-No Palpable Breast Mass.  Cardiovascular Note: NSR NO JVD  Neurologic Neurologic evaluation reveals -alert and oriented x 3 with no impairment of recent or remote memory. Mental Status-Normal.  Neuropsychiatric Examination of related systems reveals-The patient is well-nourished and well-groomed. Orientation-oriented X3.  Musculoskeletal Normal Exam - Left-Upper Extremity Strength Normal and Lower Extremity Strength Normal. Normal Exam - Right-Upper Extremity Strength Normal and Lower Extremity Strength Normal.    Assessment & Plan ([name] A. Jessalynn Mccowan MD; 07/26/2019 10:48 AM)  LIPOMA OF CHEST WALL (D17.1) Impression: Patient desires excisional lipoma in the left breast upper outer quadrant near the axilla. It is causing discomfort and interference with activities of her daily life.  TOTAL TIME 30 MIN FOR CHART REVIEW DOCUMENTATION EXAM AND DISCUSSION OF SURGICAL AND NON SURGICAL OPTIONS  Current Plans The pathophysiology of skin & subcutaneous  masses was discussed. Natural history risks without surgery were discussed. I recommended surgery to remove the mass. I explained the technique of removal with use of local anesthesia & possible need for more aggressive sedation/anesthesia for patient comfort.  Risks such as bleeding, infection, wound breakdown, heart attack, death, and other risks were discussed. I noted a good likelihood this will help address the problem. Possibility that this will not correct all symptoms was explained. Possibility of regrowth/recurrence of the mass was discussed. We will work to minimize complications. Questions were answered. The patient expresses understanding & wishes to proceed with surgery.  Pt Education - CCS Free Text Education/Instructions: discussed with patient and provided information.  FIBROADENOMA, RIGHT (D24.1) Impression: Core biopsy benign. Patient has no desire excision at this point in time.

## 2019-07-26 NOTE — H&P (View-Only) (Signed)
ROBINA DATTILO Documented: 07/26/2019 10:21 AM Location: [facility] Patient #: [id number] DOB: 1976-04-27 Single / Language: [name] / Race: Black or African American Female  History of Present Illness [name] A. Ulysess Witz MD; 07/26/2019 10:46 AM) Patient words: Patient presents for evaluation of left upper chest lipoma. She has had it for many years but is getting bigger and causing discomfort and interfering with her physical activity levels. There's been no bleeding, drainage and the area is been smooth and soft. History of right breast fibroadenoma core biopsy benign. She has no interest in excision of this area. She has a history of duodenal switch.            Diagnosis 1. Breast, right, needle core biopsy, 9:30 o'clock - FIBROADENOMA - NO MALIGNANCY IDENTIFIED 2. Breast, right, needle core biopsy, 11:30 o'clock - FIBROCYSTIC CHANGES INCLUDING APOCRINE METAPLASIA - PSEUDOANGIOMATOUS STROMAL HYPERPLASIA - ADENOSIS - NO MALIGNANCY IDENTIFIED Microscopic Comment 1. Dr. Lyndon Code reviewed the case and agrees with the above diagnosis. These results were called to [facility] on July 12, 2019. [name] MD Pathologist, Electronic Signature (Case signed 07/12/2019) Specimen Gross and Clinical Information Specimen Comment 1. Masses 2. Masses Specimen(s) Obtained: 1. Breast, right, needle core biopsy, 9:30 o'clock 2. Breast, right, needle core biopsy, 11:30 o'clock            CLINICAL DATA: Screening recall for bilateral breast masses.  EXAM: DIGITAL DIAGNOSTIC BILATERAL MAMMOGRAM WITH CAD AND TOMO  ULTRASOUND BILATERAL BREAST  COMPARISON: Previous exam(s).  ACR Breast Density Category b: There are scattered areas of fibroglandular density.  FINDINGS: In the lateral aspect of the left breast, there is a persistent mass which is ill-defined mammographically. This is favored to represent the mass seen in the left breast  on ultrasound from 02/09/2018. In the lateral posterior right breast there is a lobulated mass, increased in size from prior. There is a new asymmetry in the superior central right breast. This is not as prominent on the spot compression tomosynthesis images.  Mammographic images were processed with CAD.  Ultrasound of the left breast at 2 o'clock, 13 cm from the nipple demonstrates a stable oval hypoechoic circumscribed mass measuring 2.1 x 0.5 x 1.7 cm, previously 2.1 x 0.6 x 1.8 cm. Adjacent to this at 2 o'clock, 13 cm from the nipple is a small oval hypoechoic mass measuring 0.9 x 0.3 x 0.7 cm.  Ultrasound of the right breast at 930, 18 cm from the nipple demonstrates a lobulated oval hypoechoic mass with indistinct margins measuring 2.1 x 1.0 x 1.7 cm. At 11:30, 9 cm from the nipple there is an irregular hypoechoic mass measuring 1.2 x 0.7 x 1.0 cm. Ultrasound of the right axilla demonstrates normal-appearing lymph nodes.  IMPRESSION: 1. There is an enlarging mass in the right breast at 9:30.  2. There is an apparent new mass in the right breast at 11:30 which is indeterminate.  3. No evidence of right axillary lymphadenopathy.  4. There is a 0.9 cm likely benign mass in the left breast at 2 o'clock.  5. The larger mass in the left breast at 2 o'clock has been stable for over 2 years, and is therefore benign.  RECOMMENDATION: Ultrasound guided biopsy is recommended for the 2 right breast masses. This has been scheduled for 07/09/2019 at 2:45 p.m.  I have discussed the findings and recommendations with the patient. If applicable, a reminder letter will be sent to the patient regarding the next appointment.  BI-RADS CATEGORY 4: Suspicious.   Electronically Signed By: [name] M.D. On: 06/25/2019 13:10.  The patient is a 44 year old female.   Allergies ([name], CMA; 07/26/2019 10:22 AM) No Known Drug Allergies [04/07/2014]: Allergies  Reconciled  Medication History ([name], [place]; 07/26/2019 10:22 AM) Medications Reconciled    Vitals ([name] CMA; 07/26/2019 10:22 AM) 07/26/2019 10:22 AM Weight: 212.13 lb Height: 66in Body Surface Area: 2.05 m Body Mass Index: 34.24 kg/m  Temp.: 62F  Pulse: 84 (Regular)  BP: 122/72 (Sitting, Left Arm, Standard)        Physical Exam (Zunairah Devers A. Kadee Philyaw MD; 07/26/2019 10:47 AM)  Integumentary Note: Left upper chest as a 5 cm x 6 cm mobile smooth mass consistent with lipoma.  Eye Note: blind  Chest and Lung Exam Note: Work of breathing normal No wheezing  Breast Breast - Left-Symmetric, Non Tender, No Biopsy scars, no Dimpling - Left, No Inflammation, No Lumpectomy scars, No Mastectomy scars, No Peau d' Orange. Breast - Right-Symmetric, Non Tender, No Biopsy scars, no Dimpling - Right, No Inflammation, No Lumpectomy scars, No Mastectomy scars, No Peau d' Orange. Breast Lump-No Palpable Breast Mass.  Cardiovascular Note: NSR NO JVD  Neurologic Neurologic evaluation reveals -alert and oriented x 3 with no impairment of recent or remote memory. Mental Status-Normal.  Neuropsychiatric Examination of related systems reveals-The patient is well-nourished and well-groomed. Orientation-oriented X3.  Musculoskeletal Normal Exam - Left-Upper Extremity Strength Normal and Lower Extremity Strength Normal. Normal Exam - Right-Upper Extremity Strength Normal and Lower Extremity Strength Normal.    Assessment & Plan (Cosette Prindle A. Kyisha Fowle MD; 07/26/2019 10:48 AM)  LIPOMA OF CHEST WALL (D17.1) Impression: Patient desires excisional lipoma in the left breast upper outer quadrant near the axilla. It is causing discomfort and interference with activities of her daily life.  TOTAL TIME 30 MIN FOR CHART REVIEW DOCUMENTATION EXAM AND DISCUSSION OF SURGICAL AND NON SURGICAL OPTIONS  Current Plans The pathophysiology of skin & subcutaneous  masses was discussed. Natural history risks without surgery were discussed. I recommended surgery to remove the mass. I explained the technique of removal with use of local anesthesia & possible need for more aggressive sedation/anesthesia for patient comfort.  Risks such as bleeding, infection, wound breakdown, heart attack, death, and other risks were discussed. I noted a good likelihood this will help address the problem. Possibility that this will not correct all symptoms was explained. Possibility of regrowth/recurrence of the mass was discussed. We will work to minimize complications. Questions were answered. The patient expresses understanding & wishes to proceed with surgery.  Pt Education - CCS Free Text Education/Instructions: discussed with patient and provided information.  FIBROADENOMA, RIGHT (D24.1) Impression: Core biopsy benign. Patient has no desire excision at this point in time.

## 2019-07-27 ENCOUNTER — Telehealth: Payer: Self-pay | Admitting: *Deleted

## 2019-07-27 NOTE — Telephone Encounter (Signed)
Called pt & informed of good lab results per Dr Burr Medico except Vit D a little low & offered multivitamin infusion if she feels fatigued.  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  Message sent to scheduling to set up infusion appt. Routed to Dr Burr Medico for orders.

## 2019-07-27 NOTE — Telephone Encounter (Signed)
-----   Message from Truitt Merle, MD sent at 07/27/2019  2:37 PM EDT ----- Please let pt know her lab rsults, no anemia, iron and other levels are WNL except low vitD level. No need iv iron now, but OK to give multivitamin infusion if she feels fatigued, will let her decide, and schedule if she wishes to proceed. Continue lab monitoring, thanks  Truitt Merle  07/27/2019

## 2019-07-30 ENCOUNTER — Inpatient Hospital Stay: Payer: Medicare HMO

## 2019-07-30 ENCOUNTER — Telehealth: Payer: Self-pay | Admitting: Hematology

## 2019-07-30 NOTE — Telephone Encounter (Signed)
Spoke with Mrs. Mackenzie [name] about her visit at 0830, she informed me that she called this morning and left a message with someone-unable to remember who- canceling her appointment for today due to her being sick. Patient stated that she was going to call back either this evening or next week once she felt better.   Mackenzie [name]

## 2019-08-06 ENCOUNTER — Inpatient Hospital Stay: Payer: Medicare HMO | Attending: Hematology

## 2019-08-06 ENCOUNTER — Other Ambulatory Visit: Payer: Self-pay

## 2019-08-06 VITALS — BP 136/85 | HR 79 | Temp 98.3°F | Resp 18

## 2019-08-06 DIAGNOSIS — E559 Vitamin D deficiency, unspecified: Secondary | ICD-10-CM | POA: Diagnosis not present

## 2019-08-06 DIAGNOSIS — E509 Vitamin A deficiency, unspecified: Secondary | ICD-10-CM | POA: Diagnosis not present

## 2019-08-06 DIAGNOSIS — N920 Excessive and frequent menstruation with regular cycle: Secondary | ICD-10-CM | POA: Diagnosis not present

## 2019-08-06 DIAGNOSIS — D649 Anemia, unspecified: Secondary | ICD-10-CM | POA: Diagnosis not present

## 2019-08-06 DIAGNOSIS — Z79899 Other long term (current) drug therapy: Secondary | ICD-10-CM | POA: Insufficient documentation

## 2019-08-06 MED ORDER — SODIUM CHLORIDE 0.9 % IV SOLN
Freq: Once | INTRAVENOUS | Status: AC
  Filled 2019-08-06: qty 1000

## 2019-08-06 NOTE — Patient Instructions (Signed)

## 2019-08-06 NOTE — Progress Notes (Signed)
Confirmed with Dr. Burr Medico, no copper in MVI infusion today. Order for MVI infusion without copper is in signed and held.  Demetrius Charity, PharmD, BCPS, [facility] Pharmacist Pharmacy Phone: [phone] 08/06/2019

## 2019-08-17 ENCOUNTER — Other Ambulatory Visit: Payer: Self-pay

## 2019-08-17 ENCOUNTER — Encounter (HOSPITAL_BASED_OUTPATIENT_CLINIC_OR_DEPARTMENT_OTHER): Payer: Self-pay | Admitting: Surgery

## 2019-08-17 DIAGNOSIS — K5792 Diverticulitis of intestine, part unspecified, without perforation or abscess without bleeding: Secondary | ICD-10-CM | POA: Diagnosis not present

## 2019-08-17 DIAGNOSIS — R1030 Lower abdominal pain, unspecified: Secondary | ICD-10-CM | POA: Diagnosis not present

## 2019-08-20 ENCOUNTER — Other Ambulatory Visit (HOSPITAL_COMMUNITY)
Admission: RE | Admit: 2019-08-20 | Discharge: 2019-08-20 | Disposition: A | Discharge status: Home / Self Care | Payer: Medicare HMO | Source: Ambulatory Visit | Attending: Surgery | Admitting: Surgery

## 2019-08-20 ENCOUNTER — Ambulatory Visit: Payer: Self-pay | Admitting: Surgery

## 2019-08-20 DIAGNOSIS — Z01812 Encounter for preprocedural laboratory examination: Secondary | ICD-10-CM | POA: Diagnosis not present

## 2019-08-20 DIAGNOSIS — Z20822 Contact with and (suspected) exposure to covid-19: Secondary | ICD-10-CM | POA: Diagnosis not present

## 2019-08-20 LAB — SARS CORONAVIRUS 2 (TAT 6-24 HRS): SARS Coronavirus 2: NEGATIVE

## 2019-08-23 ENCOUNTER — Encounter (HOSPITAL_BASED_OUTPATIENT_CLINIC_OR_DEPARTMENT_OTHER): Payer: Self-pay | Admitting: Surgery

## 2019-08-23 NOTE — Progress Notes (Signed)
Notified pt to come in for lab work and soap and drink, pt verbalized understanding.

## 2019-08-23 NOTE — Progress Notes (Signed)
Pt's mom picked up drink and soap, pt will need labs drawn day of surgery.

## 2019-08-23 NOTE — Progress Notes (Signed)

## 2019-08-24 ENCOUNTER — Encounter (HOSPITAL_BASED_OUTPATIENT_CLINIC_OR_DEPARTMENT_OTHER): Payer: Self-pay | Admitting: Surgery

## 2019-08-24 ENCOUNTER — Encounter (HOSPITAL_BASED_OUTPATIENT_CLINIC_OR_DEPARTMENT_OTHER)
Admission: RE | Disposition: A | Discharge status: Home / Self Care | Payer: Self-pay | Source: Home / Self Care | Attending: Surgery

## 2019-08-24 ENCOUNTER — Ambulatory Visit (HOSPITAL_BASED_OUTPATIENT_CLINIC_OR_DEPARTMENT_OTHER): Payer: Medicare HMO | Admitting: Anesthesiology

## 2019-08-24 ENCOUNTER — Ambulatory Visit (HOSPITAL_BASED_OUTPATIENT_CLINIC_OR_DEPARTMENT_OTHER)
Admission: RE | Admit: 2019-08-24 | Discharge: 2019-08-24 | Disposition: A | Discharge status: Home / Self Care | Payer: Medicare HMO | Attending: Surgery | Admitting: Surgery

## 2019-08-24 ENCOUNTER — Other Ambulatory Visit: Payer: Self-pay

## 2019-08-24 DIAGNOSIS — K449 Diaphragmatic hernia without obstruction or gangrene: Secondary | ICD-10-CM | POA: Insufficient documentation

## 2019-08-24 DIAGNOSIS — Z87891 Personal history of nicotine dependence: Secondary | ICD-10-CM | POA: Diagnosis not present

## 2019-08-24 DIAGNOSIS — G932 Benign intracranial hypertension: Secondary | ICD-10-CM | POA: Insufficient documentation

## 2019-08-24 DIAGNOSIS — Z8669 Personal history of other diseases of the nervous system and sense organs: Secondary | ICD-10-CM | POA: Insufficient documentation

## 2019-08-24 DIAGNOSIS — H547 Unspecified visual loss: Secondary | ICD-10-CM | POA: Diagnosis not present

## 2019-08-24 DIAGNOSIS — D179 Benign lipomatous neoplasm, unspecified: Secondary | ICD-10-CM | POA: Diagnosis not present

## 2019-08-24 DIAGNOSIS — G709 Myoneural disorder, unspecified: Secondary | ICD-10-CM | POA: Diagnosis not present

## 2019-08-24 DIAGNOSIS — R531 Weakness: Secondary | ICD-10-CM | POA: Insufficient documentation

## 2019-08-24 DIAGNOSIS — D171 Benign lipomatous neoplasm of skin and subcutaneous tissue of trunk: Secondary | ICD-10-CM | POA: Diagnosis not present

## 2019-08-24 HISTORY — PX: LIPOMA EXCISION: SHX5283

## 2019-08-24 SURGERY — EXCISION LIPOMA
Anesthesia: General | Site: Chest | Laterality: Left

## 2019-08-24 MED ORDER — CELECOXIB 200 MG PO CAPS
200.0000 mg | ORAL_CAPSULE | ORAL | Status: AC
  Administered 2019-08-24: 200 mg via ORAL

## 2019-08-24 MED ORDER — LIDOCAINE 2% (20 MG/ML) 5 ML SYRINGE
INTRAMUSCULAR | Status: DC | PRN
  Administered 2019-08-24: 60 mg via INTRAVENOUS

## 2019-08-24 MED ORDER — MIDAZOLAM HCL 2 MG/2ML IJ SOLN
INTRAMUSCULAR | Status: AC
  Filled 2019-08-24: qty 2

## 2019-08-24 MED ORDER — MIDAZOLAM HCL 2 MG/2ML IJ SOLN: 1.0000 mg | INTRAMUSCULAR | Status: DC | PRN

## 2019-08-24 MED ORDER — ACETAMINOPHEN 325 MG PO TABS: 325.0000 mg | ORAL_TABLET | ORAL | Status: DC | PRN

## 2019-08-24 MED ORDER — CHLORHEXIDINE GLUCONATE CLOTH 2 % EX PADS: 6.0000 | MEDICATED_PAD | Freq: Once | CUTANEOUS | Status: DC

## 2019-08-24 MED ORDER — LACTATED RINGERS IV SOLN: INTRAVENOUS | Status: DC

## 2019-08-24 MED ORDER — FENTANYL CITRATE (PF) 100 MCG/2ML IJ SOLN
INTRAMUSCULAR | Status: AC
  Filled 2019-08-24: qty 2

## 2019-08-24 MED ORDER — ONDANSETRON HCL 4 MG/2ML IJ SOLN
INTRAMUSCULAR | Status: DC | PRN
  Administered 2019-08-24: 4 mg via INTRAVENOUS

## 2019-08-24 MED ORDER — IBUPROFEN 800 MG PO TABS: 800.0000 mg | ORAL_TABLET | Freq: Three times a day (TID) | ORAL | 0 refills | Status: DC | PRN

## 2019-08-24 MED ORDER — ACETAMINOPHEN 160 MG/5ML PO SOLN: 325.0000 mg | ORAL | Status: DC | PRN

## 2019-08-24 MED ORDER — ACETAMINOPHEN 500 MG PO TABS
ORAL_TABLET | ORAL | Status: AC
  Filled 2019-08-24: qty 2

## 2019-08-24 MED ORDER — ACETAMINOPHEN 500 MG PO TABS
1000.0000 mg | ORAL_TABLET | ORAL | Status: AC
  Administered 2019-08-24: 1000 mg via ORAL

## 2019-08-24 MED ORDER — HYDROCODONE-ACETAMINOPHEN 5-325 MG PO TABS: 1.0000 | ORAL_TABLET | Freq: Four times a day (QID) | ORAL | 0 refills | Status: DC | PRN

## 2019-08-24 MED ORDER — FENTANYL CITRATE (PF) 100 MCG/2ML IJ SOLN: 50.0000 ug | INTRAMUSCULAR | Status: DC | PRN

## 2019-08-24 MED ORDER — CEFAZOLIN SODIUM-DEXTROSE 2-4 GM/100ML-% IV SOLN
INTRAVENOUS | Status: AC
  Filled 2019-08-24: qty 100

## 2019-08-24 MED ORDER — MIDAZOLAM HCL 5 MG/5ML IJ SOLN
INTRAMUSCULAR | Status: DC | PRN
  Administered 2019-08-24: 2 mg via INTRAVENOUS

## 2019-08-24 MED ORDER — CELECOXIB 200 MG PO CAPS
ORAL_CAPSULE | ORAL | Status: AC
  Filled 2019-08-24: qty 1

## 2019-08-24 MED ORDER — GABAPENTIN 300 MG PO CAPS
300.0000 mg | ORAL_CAPSULE | ORAL | Status: AC
  Administered 2019-08-24: 13:00:00 300 mg via ORAL

## 2019-08-24 MED ORDER — OXYCODONE HCL 5 MG PO TABS: 5.0000 mg | ORAL_TABLET | Freq: Once | ORAL | Status: DC | PRN

## 2019-08-24 MED ORDER — ONDANSETRON HCL 4 MG/2ML IJ SOLN: 4.0000 mg | Freq: Once | INTRAMUSCULAR | Status: DC | PRN

## 2019-08-24 MED ORDER — DEXAMETHASONE SODIUM PHOSPHATE 10 MG/ML IJ SOLN
INTRAMUSCULAR | Status: AC
  Filled 2019-08-24: qty 1

## 2019-08-24 MED ORDER — DEXAMETHASONE SODIUM PHOSPHATE 4 MG/ML IJ SOLN
INTRAMUSCULAR | Status: DC | PRN
  Administered 2019-08-24: 10 mg via INTRAVENOUS

## 2019-08-24 MED ORDER — CEFAZOLIN SODIUM-DEXTROSE 2-4 GM/100ML-% IV SOLN
2.0000 g | INTRAVENOUS | Status: AC
  Administered 2019-08-24: 14:00:00 2 g via INTRAVENOUS

## 2019-08-24 MED ORDER — MEPERIDINE HCL 25 MG/ML IJ SOLN: 6.2500 mg | INTRAMUSCULAR | Status: DC | PRN

## 2019-08-24 MED ORDER — OXYCODONE HCL 5 MG/5ML PO SOLN: 5.0000 mg | Freq: Once | ORAL | Status: DC | PRN

## 2019-08-24 MED ORDER — FENTANYL CITRATE (PF) 100 MCG/2ML IJ SOLN: 25.0000 ug | INTRAMUSCULAR | Status: DC | PRN

## 2019-08-24 MED ORDER — BUPIVACAINE HCL (PF) 0.25 % IJ SOLN
INTRAMUSCULAR | Status: DC | PRN
Start: 2019-08-24 — End: 2019-08-24
  Administered 2019-08-24: 20 mL

## 2019-08-24 MED ORDER — LIDOCAINE 2% (20 MG/ML) 5 ML SYRINGE
INTRAMUSCULAR | Status: AC
  Filled 2019-08-24: qty 5

## 2019-08-24 MED ORDER — FENTANYL CITRATE (PF) 100 MCG/2ML IJ SOLN
INTRAMUSCULAR | Status: DC | PRN
  Administered 2019-08-24 (×2): 50 ug via INTRAVENOUS

## 2019-08-24 MED ORDER — ONDANSETRON HCL 4 MG/2ML IJ SOLN
INTRAMUSCULAR | Status: AC
  Filled 2019-08-24: qty 2

## 2019-08-24 MED ORDER — GABAPENTIN 300 MG PO CAPS
ORAL_CAPSULE | ORAL | Status: AC
  Filled 2019-08-24: qty 1

## 2019-08-24 MED ORDER — PROPOFOL 10 MG/ML IV BOLUS
INTRAVENOUS | Status: DC | PRN
  Administered 2019-08-24: 150 mg via INTRAVENOUS
  Administered 2019-08-24: 50 mg via INTRAVENOUS
  Administered 2019-08-24: 150 mg via INTRAVENOUS

## 2019-08-24 SURGICAL SUPPLY — 34 items
ADH SKN CLS APL DERMABOND .7 (GAUZE/BANDAGES/DRESSINGS) ×1
APL PRP STRL LF DISP 70% ISPRP (MISCELLANEOUS) ×1
BIOPATCH RED 1 DISK 7.0 (GAUZE/BANDAGES/DRESSINGS) ×1 IMPLANT
BLADE SURG 15 STRL LF DISP TIS (BLADE) ×1 IMPLANT
BLADE SURG 15 STRL SS (BLADE) ×2
CHLORAPREP W/TINT 26 (MISCELLANEOUS) ×2 IMPLANT
COVER BACK TABLE 60X90IN (DRAPES) ×2 IMPLANT
COVER MAYO STAND STRL (DRAPES) ×2 IMPLANT
DERMABOND ADVANCED (GAUZE/BANDAGES/DRESSINGS) ×1
DERMABOND ADVANCED .7 DNX12 (GAUZE/BANDAGES/DRESSINGS) IMPLANT
DRAIN CHANNEL 19F RND (DRAIN) ×1 IMPLANT
DRAPE LAPAROTOMY 100X72 PEDS (DRAPES) ×2 IMPLANT
DRAPE UTILITY XL STRL (DRAPES) ×2 IMPLANT
DRSG TEGADERM 2-3/8X2-3/4 SM (GAUZE/BANDAGES/DRESSINGS) ×1 IMPLANT
ELECT COATED BLADE 2.86 ST (ELECTRODE) ×2 IMPLANT
ELECT REM PT RETURN 9FT ADLT (ELECTROSURGICAL) ×2
ELECTRODE REM PT RTRN 9FT ADLT (ELECTROSURGICAL) ×1 IMPLANT
EVACUATOR SILICONE 100CC (DRAIN) ×1 IMPLANT
GLOVE BIOGEL PI IND STRL 8 (GLOVE) ×1 IMPLANT
GLOVE BIOGEL PI INDICATOR 8 (GLOVE) ×1
GLOVE ECLIPSE 8.0 STRL XLNG CF (GLOVE) ×2 IMPLANT
GOWN STRL REUS W/ TWL LRG LVL3 (GOWN DISPOSABLE) ×2 IMPLANT
GOWN STRL REUS W/TWL LRG LVL3 (GOWN DISPOSABLE) ×4
NDL HYPO 25X1 1.5 SAFETY (NEEDLE) ×1 IMPLANT
NEEDLE HYPO 25X1 1.5 SAFETY (NEEDLE) ×2 IMPLANT
PENCIL SMOKE EVACUATOR (MISCELLANEOUS) ×2 IMPLANT
SET BASIN DAY SURGERY F.S. (CUSTOM PROCEDURE TRAY) ×2 IMPLANT
SLEEVE SCD COMPRESS KNEE MED (MISCELLANEOUS) ×2 IMPLANT
SPONGE LAP 4X18 RFD (DISPOSABLE) ×1 IMPLANT
SUT ETHILON 2 0 FS 18 (SUTURE) ×1 IMPLANT
SUT MON AB 4-0 PC3 18 (SUTURE) ×2 IMPLANT
SUT VICRYL 3-0 CR8 SH (SUTURE) ×1 IMPLANT
SYR CONTROL 10ML LL (SYRINGE) ×2 IMPLANT
TOWEL GREEN STERILE FF (TOWEL DISPOSABLE) ×4 IMPLANT

## 2019-08-24 NOTE — Interval H&P Note (Signed)
History and Physical Interval Note:  08/24/2019 1:22 PM  Mackenzie [name]  has presented today for surgery, with the diagnosis of LIPOMA.  The various methods of treatment have been discussed with the patient and family. After consideration of risks, benefits and other options for treatment, the patient has consented to  Procedure(s): EXCISION LIPOMA LEFT UPPER CHEST (Left) as a surgical intervention.  The patient's history has been reviewed, patient examined, no change in status, stable for surgery.  I have reviewed the patient's chart and labs.  Questions were answered to the patient's satisfaction.     [place]

## 2019-08-24 NOTE — Transfer of Care (Signed)
Immediate Anesthesia Transfer of Care Note  Patient: Mackenzie [name]  Procedure(s) Performed: EXCISION LIPOMA LEFT UPPER CHEST (Left Chest)  Patient Location: PACU  Anesthesia Type:General  Level of Consciousness: sedated  Airway & Oxygen Therapy: Patient Spontanous Breathing and Patient connected to face mask oxygen  Post-op Assessment: Report given to RN and Post -op Vital signs reviewed and stable  Post vital signs: Reviewed and stable  Last Vitals:  Vitals Value Taken Time  BP 103/82 08/24/19 1437  Temp    Pulse 95 08/24/19 1439  Resp 12 08/24/19 1439  SpO2 100 % 08/24/19 1439  Vitals shown include unvalidated device data.  Last Pain:  Vitals:   08/24/19 1247  TempSrc: Temporal  PainSc: 0-No pain         Complications: No apparent anesthesia complications

## 2019-08-24 NOTE — Anesthesia Preprocedure Evaluation (Signed)
Anesthesia Evaluation  Patient identified by MRN, date of birth, ID band Patient awake    Reviewed: Allergy & Precautions, H&P , Patient's Chart, lab work & pertinent test results, reviewed documented beta blocker date and time   History of Anesthesia Complications Negative for: history of anesthetic complications  Airway Mallampati: I  TM Distance: >3 FB Neck ROM: full    Dental no notable dental hx. (+) Teeth Intact, Dental Advisory Given   Pulmonary neg pulmonary ROS, Current Smoker, former smoker,    Pulmonary exam normal breath sounds clear to auscultation       Cardiovascular Exercise Tolerance: Good negative cardio ROS   Rhythm:regular Rate:Normal     Neuro/Psych negative neurological ROS  negative psych ROS   GI/Hepatic negative GI ROS, Neg liver ROS, hiatal hernia,   Endo/Other  negative endocrine ROS  Renal/GU negative Renal ROS     Musculoskeletal   Abdominal   Peds  Hematology negative hematology ROS (+)   Anesthesia Other Findings Breast lump left   Blind 1994      Blood transfusion 04/2010 [facility] at [place] - iron transfusion per pt Pseudotumor cerebri 1994 History - caused blind    Neuromuscular disorder   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 Anemia   history    H/O hiatal hernia   repaired with weight loss surgery Depression   no meds       Reproductive/Obstetrics negative OB ROS                             Anesthesia Physical  Anesthesia Plan  ASA: III  Anesthesia Plan: General ETT and General   Post-op Pain Management:    Induction: Intravenous  PONV Risk Score and Plan: 3 and Ondansetron, Treatment may vary due to age or medical condition and Midazolam  Airway Management Planned: Oral ETT and LMA  Additional Equipment:   Intra-op Plan:   Post-operative Plan: Extubation in OR  Informed Consent: I have reviewed the patients History  and Physical, chart, labs and discussed the procedure including the risks, benefits and alternatives for the proposed anesthesia with the patient or authorized representative who has indicated his/her understanding and acceptance.     Dental Advisory Given  Plan Discussed with: CRNA, Surgeon and Anesthesiologist  Anesthesia Plan Comments: (  )        Anesthesia Quick Evaluation

## 2019-08-24 NOTE — Anesthesia Postprocedure Evaluation (Signed)
Anesthesia Post Note  Patient: Mackenzie [name]  Procedure(s) Performed: EXCISION LIPOMA LEFT UPPER CHEST (Left Chest)     Patient location during evaluation: PACU Anesthesia Type: General Level of consciousness: awake and alert Pain management: pain level controlled Vital Signs Assessment: post-procedure vital signs reviewed and stable Respiratory status: spontaneous breathing, nonlabored ventilation, respiratory function stable and patient connected to nasal cannula oxygen Cardiovascular status: blood pressure returned to baseline and stable Postop Assessment: no apparent nausea or vomiting Anesthetic complications: no    Last Vitals:  Vitals:   08/24/19 1515 08/24/19 1530  BP: 120/90 134/90  Pulse: 80 81  Resp: 15 16  Temp:  37.2 C  SpO2: 100% 100%    Last Pain:  Vitals:   08/24/19 1530  TempSrc:   PainSc: 4                  Dorea Duff

## 2019-08-24 NOTE — Discharge Instructions (Signed)
Next dose of Tylenol or Ibuprofen at 7 PM   Surgical [facility] Surgical drains are used to remove extra fluid that normally builds up in a surgical wound after surgery. A surgical drain helps to heal a surgical wound. Different kinds of surgical drains include:  Active drains. These drains use suction to pull drainage away from the surgical wound. Drainage flows through a tube to a container outside of the body. With these drains, you need to keep the bulb or the drainage container flat (compressed) at all times, except while you empty it. Flattening the bulb or container creates suction.  Passive drains. These drains allow fluid to drain naturally, by gravity. Drainage flows through a tube to a bandage (dressing) or a container outside of the body. Passive drains do not need to be emptied. A drain is placed during surgery. Right after surgery, drainage is usually bright red and a little thicker than water. The drainage may gradually turn yellow or pink and become thinner. It is likely that your health care provider will remove the drain when the drainage stops or when the amount decreases to 1-2 Tbsp (15-30 mL) during a 24-hour period. Supplies needed:  Tape.  Germ-free cleaning solution (sterile saline).  Cotton swabs.  Split gauze drain sponge: 4 x 4 inches (10 x 10 cm).  Gauze square: 4 x 4 inches (10 x 10 cm). How to care for your surgical drain Care for your drain as told by your health care provider. This is important to help prevent infection. If your drain is placed at your back, or any other hard-to-reach area, ask another person to assist you in performing the following tasks: General care  Keep the skin around the drain dry and covered with a dressing at all times.  Check your drain area every day for signs of infection. Check for: ? Redness, swelling, or pain. ? Pus or a bad smell. ? Cloudy drainage. ? Tenderness or pressure at the drain exit site. Changing the  dressing Follow instructions from your health care provider about how to change your dressing. Change your dressing at least once a day. Change it more often if needed to keep the dressing dry. Make sure you: 1. Gather your supplies. 2. Wash your hands with soap and water before you change your dressing. If soap and water are not available, use hand sanitizer. 3. Remove the old dressing. Avoid using scissors to do that. 4. Wash your hands with soap and water again after removing the old dressing. 5. Use sterile saline to clean your skin around the drain. You may need to use a cotton swab to clean the skin. 6. Place the tube through the slit in a drain sponge. Place the drain sponge so that it covers your wound. 7. Place the gauze square or another drain sponge on top of the drain sponge that is on the wound. Make sure the tube is between those layers. 8. Tape the dressing to your skin. 9. Tape the drainage tube to your skin 1-2 inches (2.5-5 cm) below the place where the tube enters your body. Taping keeps the tube from pulling on any stitches (sutures) that you have. 10. Wash your hands with soap and water. 11. Write down the color of your drainage and how often you change your dressing. How to empty your active drain  1. Make sure that you have a measuring cup that you can empty your drainage into. 2. Wash your hands with soap and water.  If soap and water are not available, use hand sanitizer. 3. Loosen any pins or clips that hold the tube in place. 4. If your health care provider tells you to strip the tube to prevent clots and tube blockages: ? Hold the tube at the skin with one hand. Use your other hand to pinch the tubing with your thumb and first finger. ? Gently move your fingers down the tube while squeezing very lightly. This clears any drainage, clots, or tissue from the tube. ? You may need to do this several times each day to keep the tube clear. Do not pull on the tube. 5. Open  the bulb cap or the drain plug. Do not touch the inside of the cap or the bottom of the plug. 6. Turn the device upside down and gently squeeze. 7. Empty all of the drainage into the measuring cup. 8. Compress the bulb or the container and replace the cap or the plug. To compress the bulb or the container, squeeze it firmly in the middle while you close the cap or plug the container. 9. Write down the amount of drainage that you have in each 24-hour period. If you have less than 2 Tbsp (30 mL) of drainage during 24 hours, contact your health care provider. 10. Flush the drainage down the toilet. 11. Wash your hands with soap and water. Contact a health care provider if:  You have redness, swelling, or pain around your drain area.  You have pus or a bad smell coming from your drain area.  You have a fever or chills.  The skin around your drain is warm to the touch.  The amount of drainage that you have is increasing instead of decreasing.  You have drainage that is cloudy.  There is a sudden stop or a sudden decrease in the amount of drainage that you have.  Your drain tube falls out.  Your active drain does not stay compressed after you empty it. Summary  Surgical drains are used to remove extra fluid that normally builds up in a surgical wound after surgery.  Different kinds of surgical drains include active drains and passive drains. Active drains use suction to pull drainage away from the surgical wound, and passive drains allow fluid to drain naturally.  It is important to care for your drain to prevent infection. If your drain is placed at your back, or any other hard-to-reach area, ask another person to assist you.  Contact your health care provider if you have redness, swelling, or pain around your drain area. This information is not intended to replace advice given to you by your health care provider. Make sure you discuss any questions you have with your health care  provider. Document Revised: 05/20/2018 Document Reviewed: 05/20/2018 Elsevier Patient Education  2020 [place]: POST OP INSTRUCTIONS  ######################################################################  EAT Gradually transition to a high fiber diet with a fiber supplement over the next few weeks after discharge.  Start with a pureed / full liquid diet (see below)  WALK Walk an hour a day.  Control your pain to do that.    CONTROL PAIN Control pain so that you can walk, sleep, tolerate sneezing/coughing, go up/down stairs.  HAVE A BOWEL MOVEMENT DAILY Keep your bowels regular to avoid problems.  OK to try a laxative to override constipation.  OK to use an antidairrheal to slow down diarrhea.  Call if not better after 2 tries  CALL IF YOU HAVE PROBLEMS/CONCERNS Call if  you are still struggling despite following these instructions. Call if you have concerns not answered by these instructions  ######################################################################    1. DIET: Follow a light bland diet & liquids the first 24 hours after arrival home, such as soup, liquids, starches, etc.  Be sure to drink plenty of fluids.  Quickly advance to a usual solid diet within a few days.  Avoid fast food or heavy meals as your are more likely to get nauseated or have irregular bowels.  A low-fat, high-fiber diet for the rest of your life is ideal.   2. Take your usually prescribed home medications unless otherwise directed. 3. PAIN CONTROL: a. Pain is best controlled by a usual combination of three different methods TOGETHER: i. Ice/Heat ii. Over the counter pain medication iii. Prescription pain medication b. Most patients will experience some swelling and bruising around the incisions.  Ice packs or heating pads (30-60 minutes up to 6 times a day) will help. Use ice for the first few days to help decrease swelling and bruising, then switch to heat to help relax  tight/sore spots and speed recovery.  Some people prefer to use ice alone, heat alone, alternating between ice & heat.  Experiment to what works for you.  Swelling and bruising can take several weeks to resolve.   c. It is helpful to take an over-the-counter pain medication regularly for the first few weeks.  Choose one of the following that works best for you: i. Naproxen (Aleve, etc)  Two 220mg  tabs twice a day ii. Ibuprofen (Advil, etc) Three 200mg  tabs four times a day (every meal & bedtime) iii. Acetaminophen (Tylenol, etc) 500-650mg  four times a day (every meal & bedtime) d. A  prescription for pain medication (such as oxycodone, hydrocodone, etc) should be given to you upon discharge.  Take your pain medication as prescribed.  i. If you are having problems/concerns with the prescription medicine (does not control pain, nausea, vomiting, rash, itching, etc), please call us [phone] to see if we need to switch you to a different pain medicine that will work better for you and/or control your side effect better. ii. If you need a refill on your pain medication, please contact your pharmacy.  They will contact our office to request authorization. Prescriptions will not be filled after 5 pm or on week-ends. 4. Avoid getting constipated.  Between the surgery and the pain medications, it is common to experience some constipation.  Increasing fluid intake and taking a fiber supplement (such as Metamucil, Citrucel, FiberCon, MiraLax, etc) 1-2 times a day regularly will usually help prevent this problem from occurring.  A mild laxative (prune juice, Milk of Magnesia, MiraLax, etc) should be taken according to package directions if there are no bowel movements after 48 hours.   5. Wash / shower every day.  You may shower over the dressings as they are waterproof.  Continue to shower over incision(s) after the dressing is off. 6. Remove your waterproof bandages 5 days after surgery.  You may leave the  incision open to air.  You may have skin tapes (Steri Strips) covering the incision(s).  Leave them on until one week, then remove.  You may replace a dressing/Band-Aid to cover the incision for comfort if you wish.      7. ACTIVITIES as tolerated:   a. You may resume regular (light) daily activities beginning the next day--such as daily self-care, walking, climbing stairs--gradually increasing activities as tolerated.  If you can walk 30  minutes without difficulty, it is safe to try more intense activity such as jogging, treadmill, bicycling, low-impact aerobics, swimming, etc. b. Save the most intensive and strenuous activity for last such as sit-ups, heavy lifting, contact sports, etc  Refrain from any heavy lifting or straining until you are off narcotics for pain control.   c. DO NOT PUSH THROUGH PAIN.  Let pain be your guide: If it hurts to do something, don't do it.  Pain is your body warning you to avoid that activity for another week until the pain goes down. d. You may drive when you are no longer taking prescription pain medication, you can comfortably wear a seatbelt, and you can safely maneuver your car and apply brakes. e. Dennis Bast may have sexual intercourse when it is comfortable.  8. FOLLOW UP in our office a. Please call [facility] at [phone] to set up an appointment to see your surgeon in the office for a follow-up appointment approximately 2-3 weeks after your surgery. b. Make sure that you call for this appointment the day you arrive home to insure a convenient appointment time. 9. IF YOU HAVE DISABILITY OR FAMILY LEAVE FORMS, BRING THEM TO THE OFFICE FOR PROCESSING.  DO NOT GIVE THEM TO YOUR DOCTOR.   WHEN TO CALL us [phone]: 1. Poor pain control 2. Reactions / problems with new medications (rash/itching, nausea, etc)  3. Fever over 101.5 F (38.5 C) 4. Worsening swelling or bruising 5. Continued bleeding from incision. 6. Increased pain, redness, or drainage from the  incision 7. Difficulty breathing / swallowing   The clinic staff is available to answer your questions during regular business hours (8:30am-5pm).  Please don't hesitate to call and ask to speak to one of our nurses for clinical concerns.   If you have a medical emergency, go to the nearest emergency room or call 911.  A surgeon from [facility] is always on call at the [facility], [place], [place], [place], [place]  [address] ? MAIN: [phone] ? TOLL FREE: [phone] ?  FAX [phone] [email or web address]     Post Anesthesia Home Care Instructions  Activity: Get plenty of rest for the remainder of the day. A responsible individual must stay with you for 24 hours following the procedure.  For the next 24 hours, DO NOT: -Drive a car -Paediatric nurse -Drink alcoholic beverages -Take any medication unless instructed by your physician -Make any legal decisions or sign important papers.  Meals: Start with liquid foods such as gelatin or soup. Progress to regular foods as tolerated. Avoid greasy, spicy, heavy foods. If nausea and/or vomiting occur, drink only clear liquids until the nausea and/or vomiting subsides. Call your physician if vomiting continues.  Special Instructions/Symptoms: Your throat may feel dry or sore from the anesthesia or the breathing tube placed in your throat during surgery. If this causes discomfort, gargle with warm salt water. The discomfort should disappear within 24 hours.  If you had a scopolamine patch placed behind your ear for the management of post- operative nausea and/or vomiting:  1. The medication in the patch is effective for 72 hours, after which it should be removed.  Wrap patch in a tissue and discard in the trash. Wash hands thoroughly with soap and water. 2. You may remove the patch earlier than 72 hours if you experience unpleasant side effects which may include  dry mouth, dizziness or visual disturbances. 3. Avoid touching the patch.  Wash your hands with soap and water after contact with the patch.        JP Drain [employer] this sheet to all of your post-operative appointments while you have your drains.  Please measure your drains by CC's or ML's.  Make sure you drain and measure your JP Drains 2 or 3 times per day.  At the end of each day, add up totals for the left side and add up totals for the right side.    ( 9 am )     ( 3 pm )        ( 9 pm )                Date L  R  L  R  L  R  Total L/R

## 2019-08-24 NOTE — Op Note (Signed)
Preoperative diagnosis: Left upper chest 5 cm x 8 cm lipoma  Postop diagnosis: Same in subcu tissue  Procedure: Excision of left upper chest lipoma  Surgeon: Erroll Luna, MD  Anesthesia: General with 0.25% Marcaine plain  EBL: Minimal  Drains: 19 round drain to l cavity Specimen: Lipoma to pathology  Indications for procedure: The patient presents for excision of a large left upper chest lipoma.  Risk, benefits and other options discussed.  Likelihood of this being benign discussed and observation given as alternative to surgery.  She opted for a left upper chest lipoma excision.The procedure has been discussed with the patient.  Alternative therapies have been discussed with the patient.  Operative risks include bleeding,  Infection,  Organ injury,  Nerve injury,  Blood vessel injury,  DVT,  Pulmonary embolism,  Death,  And possible reoperation.  Medical management risks include worsening of present situation.  The success of the procedure is 50 -90 % at treating patients symptoms.  The patient understands and agrees to proceed.   Description of procedure: The patient was met in the holding area and questions were answered.  We marked the lipoma together with the assistant of the patient and this was clearly marked and the patient felt and agreed this was the proper spot.  Questions were answered.  She was then taken back to the operative room.  She is placed supine upon the OR table.  After induction of general stage the left upper chest was prepped and draped in sterile fashion and timeout performed.  Proper patient, site and procedure were verified.  She received appropriate preoperative antibiotics.  A transverse incision was made of the mass.  This about a 5 x 8 cm mass and dissection into the subcu fat found a multilobulated lipoma.  It was excised with grossly negative margins.  Hemostasis achieved.  I infiltrated the wound with local anesthetic.  Given the large dead space I placed a 19  round drain into the cavity and secured to the skin with 2-0 nylon through a separate stab incision.  Wound closed with 3-0 Vicryl and 4-0 Monocryl.  Dermabond applied.

## 2019-08-24 NOTE — Anesthesia Procedure Notes (Signed)
Procedure Name: Intubation Date/Time: 08/24/2019 2:00 PM Performed by: Maryella Shivers, CRNA Pre-anesthesia Checklist: Patient identified, Emergency Drugs available, Suction available and Patient being monitored Patient Re-evaluated:Patient Re-evaluated prior to induction Oxygen Delivery Method: Circle system utilized Preoxygenation: Pre-oxygenation with 100% oxygen Induction Type: IV induction Ventilation: Mask ventilation without difficulty Laryngoscope Size: Mac and 4 Grade View: Grade I Tube type: Oral Tube size: 7.0 mm Number of attempts: 1 Airway Equipment and Method: Stylet and Oral airway Placement Confirmation: ETT inserted through vocal cords under direct vision,  positive ETCO2 and breath sounds checked- equal and bilateral Secured at: 21 cm Tube secured with: Tape Dental Injury: Teeth and Oropharynx as per pre-operative assessment

## 2019-08-25 ENCOUNTER — Encounter: Payer: Self-pay | Admitting: *Deleted

## 2019-08-25 LAB — SURGICAL PATHOLOGY

## 2019-08-30 DIAGNOSIS — Z136 Encounter for screening for cardiovascular disorders: Secondary | ICD-10-CM | POA: Diagnosis not present

## 2019-08-30 DIAGNOSIS — R5382 Chronic fatigue, unspecified: Secondary | ICD-10-CM | POA: Diagnosis not present

## 2019-08-30 DIAGNOSIS — E559 Vitamin D deficiency, unspecified: Secondary | ICD-10-CM | POA: Diagnosis not present

## 2019-08-30 DIAGNOSIS — E569 Vitamin deficiency, unspecified: Secondary | ICD-10-CM | POA: Diagnosis not present

## 2019-08-30 DIAGNOSIS — F330 Major depressive disorder, recurrent, mild: Secondary | ICD-10-CM | POA: Diagnosis not present

## 2019-08-30 DIAGNOSIS — Z5181 Encounter for therapeutic drug level monitoring: Secondary | ICD-10-CM | POA: Diagnosis not present

## 2019-08-30 DIAGNOSIS — E531 Pyridoxine deficiency: Secondary | ICD-10-CM | POA: Diagnosis not present

## 2019-08-30 DIAGNOSIS — R399 Unspecified symptoms and signs involving the genitourinary system: Secondary | ICD-10-CM | POA: Diagnosis not present

## 2019-08-30 DIAGNOSIS — E213 Hyperparathyroidism, unspecified: Secondary | ICD-10-CM | POA: Diagnosis not present

## 2019-08-30 DIAGNOSIS — E611 Iron deficiency: Secondary | ICD-10-CM | POA: Diagnosis not present

## 2019-08-30 DIAGNOSIS — Z13220 Encounter for screening for lipoid disorders: Secondary | ICD-10-CM | POA: Diagnosis not present

## 2019-08-31 DIAGNOSIS — R399 Unspecified symptoms and signs involving the genitourinary system: Secondary | ICD-10-CM | POA: Diagnosis not present

## 2019-09-20 ENCOUNTER — Inpatient Hospital Stay: Payer: Medicare HMO

## 2019-09-23 ENCOUNTER — Inpatient Hospital Stay: Payer: Medicare HMO | Admitting: Hematology

## 2019-09-23 ENCOUNTER — Inpatient Hospital Stay: Payer: Medicare HMO

## 2019-10-01 ENCOUNTER — Encounter: Payer: Self-pay | Admitting: Hematology

## 2019-10-05 ENCOUNTER — Other Ambulatory Visit: Payer: Medicare HMO

## 2019-10-07 NOTE — Progress Notes (Signed)
[place]   [phone] [phone]   Clinic Follow up Note   Patient Care Team: Maurice Small, MD as PCP - General (Family Medicine)  Date of Service:  10/08/2019  CHIEF COMPLAINT: F/u of anemia and Vitamin deficiencies   CURRENT THERAPY:  Feraheme, as needed, MVI and copper IV replacement as needed started in Aug 2016  INTERVAL HISTORY: Mackenzie [name] is here for a follow up of anemia and Vitamin Deficiencies. She was last seen by me 7 months ago. She presents to the clinic with her mother.   She is clinically stable, moderate fatigue is stable, no pain or other discomfort.  She reports drenching night sweats for the past months, intermittent, no fever, weight loss, or other new complaints.  She had endometrial ablation in late 2020, if sure if she is going through menopause.  She denies significant mood swing, weight gain or other symptoms. ROS otherwise negative   MEDICAL HISTORY:  Past Medical History:  Diagnosis Date  . Anemia    history  . Blind 1994  . Blood transfusion 04/2010   cancer center at [facility] long - iron transfusion per pt  . Breast lump left  . Depression    no meds  . H/O hiatal hernia    repaired with weight loss surgery  . History of uterine fibroid   . 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 Pseudotumor cerebri 1994   History - caused blind    SURGICAL HISTORY: Past Surgical History:  Procedure Laterality Date  . APPENDECTOMY     removed with switch surgery  . BREAST EXCISIONAL BIOPSY Left    fibroadenoma  . BREAST SURGERY  2012   right breast lumpectomy  . CHOLECYSTECTOMY     removed with switch surgery  . DILITATION & CURRETTAGE/HYSTROSCOPY WITH HYDROTHERMAL ABLATION N/A 12/16/2018   Procedure: DILATATION & CURETTAGE/HYSTEROSCOPY WITH HYDROTHERMAL ABLATION;  Surgeon: Janyth Pupa, DO;  Location: [place];  Service: Gynecology;  Laterality: N/A;   . eye decompression x3  1994   Bilateral   . FETAL BLOOD TRANSFUSION  jan 29,2012  . LIPOMA EXCISION Left 08/24/2019   Procedure: EXCISION LIPOMA LEFT UPPER CHEST;  Surgeon: Erroll Luna, MD;  Location: [place];  Service: General;  Laterality: Left;  . lp shunt  1994   removed in 2009  . MYOMECTOMY    . rurod switch  2007   weight loss surgery done in [place] (duodenal switch)    I have reviewed the social history and family history with the patient and they are unchanged from previous note.  ALLERGIES:  is allergic to influenza vaccines.  MEDICATIONS:  Current Outpatient Medications  Medication Sig Dispense Refill  . Calcium Carbonate (CALTRATE 600 PO) Take 2 tablets by mouth 2 (two) times daily.    . Cholecalciferol (VITAMIN D3 PO) Take 100,000 Units by mouth 2 (two) times daily.     . Cyanocobalamin (VITAMIN B-12 PO) Take 1 tablet by mouth daily.    Marland Kitchen HYDROcodone-acetaminophen (NORCO/VICODIN) 5-325 MG tablet Take 1 tablet by mouth every 6 (six) hours as needed for moderate pain. 15 tablet 0  . ibuprofen (ADVIL) 800 MG tablet Take 1 tablet (800 mg total) by mouth every 8 (eight) hours as needed. 30 tablet 0  . Iron Heme Polypeptide 12 MG TABS Take by mouth 2 (two) times daily.    . Probiotic Product (PROBIOTIC PO) Take 1 capsule by  mouth daily.    . sertraline (ZOLOFT) 50 MG tablet Take 50 mg by mouth daily.    . vitamin A 25000 UNIT capsule Take 50,000 Units by mouth daily.    . vitamin E 100 UNIT capsule Take by mouth. Not sure of dose    . Zolpidem Tartrate (AMBIEN PO) Take by mouth.     No current facility-administered medications for this visit.    PHYSICAL EXAMINATION: ECOG PERFORMANCE STATUS: 1 - Symptomatic but completely ambulatory  Vitals:   10/08/19 1044  BP: 111/62  Pulse: 84  Resp: 17  Temp: 98.3 F (36.8 C)  SpO2: 99%   Filed Weights   10/08/19 1044  Weight: 205 lb 12.8 oz (93.4 kg)    GENERAL:alert, no distress and  comfortable SKIN: skin color, texture, turgor are normal, no rashes or significant lesions EYES: normal, Conjunctiva are pink and non-injected, sclera clear NECK: supple, thyroid normal size, non-tender, without nodularity LYMPH:  no palpable lymphadenopathy in the cervical, axillary  LUNGS: clear to auscultation and percussion with normal breathing effort HEART: regular rate & rhythm and no murmurs and no lower extremity edema ABDOMEN:abdomen soft, non-tender and normal bowel sounds Musculoskeletal:no cyanosis of digits and no clubbing  NEURO: alert & oriented x 3 with fluent speech, no focal motor/sensory deficits  LABORATORY DATA:  I have reviewed the data as listed CBC Latest Ref Rng & Units 07/20/2019 05/04/2019 03/22/2019  WBC 4.0 - 10.5 K/uL 10.4 18.8(H) 8.2  Hemoglobin 12.0 - 15.0 g/dL 13.3 13.3 11.2(L)  Hematocrit 36 - 46 % 41.2 41.6 35.7(L)  Platelets 150 - 400 K/uL 191 170 221     CMP Latest Ref Rng & Units 05/04/2019 12/09/2018 11/25/2018  Glucose 70 - 99 mg/dL 130(H) 87 75  BUN 6 - 20 mg/dL 21(H) 12 14  Creatinine 0.44 - 1.00 mg/dL 0.77 0.70 0.77  Sodium 135 - 145 mmol/L 135 136 138  Potassium 3.5 - 5.1 mmol/L 3.7 5.0 3.8  Chloride 98 - 111 mmol/L 110 101 109  CO2 22 - 32 mmol/L 17(L) 28 23  Calcium 8.9 - 10.3 mg/dL 8.2(L) 9.0 8.7(L)  Total Protein 6.5 - 8.1 g/dL 7.8 7.5 7.8  Total Bilirubin 0.3 - 1.2 mg/dL 0.9 0.7 0.5  Alkaline Phos 38 - 126 U/L 53 63 69  AST 15 - 41 U/L 27 33 38  ALT 0 - 44 U/L 22 24 54(H)   Outside lab on Aug 31, 2019 WBC 10.8, hemoglobin 12.5, hematocrit 37.3%,.  242K CMP within normal limits, calcium 8.8 PTH 29 Vitamin B-12 855, now 65, TIBC 351, iron saturation 19% Ferritin 64.2 Vitamin D 15.7    RADIOGRAPHIC STUDIES: I have personally reviewed the radiological images as listed and agreed with the findings in the report. No results found.   ASSESSMENT & PLAN:  Mackenzie [name] is a 44 y.o. female with    1. Nutritional anemia -She  was diagnosed with iron deficient anemia secondary to her duodenal switch procedure, and heavy menstrual period. She responded well to IV iron. -She now has macrocytic anemia, but the L97 and folic acid level are normal. She also has low Cooper and zinc level, which may also contribute to her anemia -Her anemia did not correct completely by IV iron. She likely has as a component of other nutritional deficient anemia, possible secondary to her duodenal switch procedure -She is being treated with oral multivitamin and multivitamin infusion. She is also on oral Heme iron 12mg  4 times daily.  -She  underwent D&C ablation on 12/16/18. She has not had period since. -Her last IV Iron was 03/29/19 -I reviewed her recent outside lab, iron level was adequate.  No need IV iron today   2. Other vitamin deficiency  -She has received 600K U Vit D and 50K U VitA at Dr. Lucinda Dell office in 2016, now on high oral dose  -She received multivitamin 60ml (containing vitamin 400 unit, vitamin A 6600 unit) intravenous infusion in1/2016,8/2016and laston 12/17/17, about every 3 months.Responded well. -She has been receiving Multivitamin Infusion every 3 months if level is low.  Her recent vitamin D level was very low, will proceed with Infusion today. -She used to have vitamin D IM injection at her plastic surgeon's office in [place].  I will ask our pharmacy to see if we can get vitamin D injection  3. Hypocalcemia -Hypocalcemia could be related to her vitamin D deficiency. She'll continue oral calcium and vitamin D. -She still has leg cramps. I encouraged her to take oral magnesium as well.   4. Night sweats -She had endometrial ablation in 2020 -Her night sweats is possible menopause related.  She has no other concerning signs for malignancy.  She will continue to follow-up with her primary care physician for other medical issues.  Plan -We will proceed multivitamin infusion today, no IV  iron -Our pharmacy checked [facility] outpatient pharmacy, unfortunately we do not carry vitamin D injections. -Lab in 3 and 6 months -f/u in 6 months a few days after lab    No problem-specific Assessment & Plan notes found for this encounter.   No orders of the defined types were placed in this encounter.  All questions were answered. The patient knows to call the clinic with any problems, questions or concerns. No barriers to learning was detected. The total time spent in the appointment was 25 minutes.     Truitt Merle, MD 10/10/2019   I, Joslyn Devon, am acting as scribe for Truitt Merle, MD.   I have reviewed the above documentation for accuracy and completeness, and I agree with the above.

## 2019-10-08 ENCOUNTER — Inpatient Hospital Stay: Payer: Medicare HMO

## 2019-10-08 ENCOUNTER — Other Ambulatory Visit: Payer: Self-pay

## 2019-10-08 ENCOUNTER — Inpatient Hospital Stay: Payer: Medicare HMO | Attending: Hematology | Admitting: Hematology

## 2019-10-08 VITALS — BP 111/62 | HR 84 | Temp 98.3°F | Resp 17 | Ht 66.0 in | Wt 205.8 lb

## 2019-10-08 DIAGNOSIS — N920 Excessive and frequent menstruation with regular cycle: Secondary | ICD-10-CM | POA: Diagnosis not present

## 2019-10-08 DIAGNOSIS — D500 Iron deficiency anemia secondary to blood loss (chronic): Secondary | ICD-10-CM | POA: Diagnosis not present

## 2019-10-08 DIAGNOSIS — R252 Cramp and spasm: Secondary | ICD-10-CM | POA: Insufficient documentation

## 2019-10-08 DIAGNOSIS — D509 Iron deficiency anemia, unspecified: Secondary | ICD-10-CM

## 2019-10-08 DIAGNOSIS — R61 Generalized hyperhidrosis: Secondary | ICD-10-CM | POA: Insufficient documentation

## 2019-10-08 DIAGNOSIS — Z86018 Personal history of other benign neoplasm: Secondary | ICD-10-CM | POA: Diagnosis not present

## 2019-10-08 DIAGNOSIS — E509 Vitamin A deficiency, unspecified: Secondary | ICD-10-CM | POA: Diagnosis not present

## 2019-10-08 DIAGNOSIS — Z79899 Other long term (current) drug therapy: Secondary | ICD-10-CM | POA: Insufficient documentation

## 2019-10-08 DIAGNOSIS — E559 Vitamin D deficiency, unspecified: Secondary | ICD-10-CM | POA: Diagnosis not present

## 2019-10-08 MED ORDER — SODIUM CHLORIDE 0.9 % IV SOLN
Freq: Once | INTRAVENOUS | Status: AC
  Filled 2019-10-08: qty 1000

## 2019-10-08 NOTE — Patient Instructions (Signed)

## 2019-10-10 ENCOUNTER — Encounter: Payer: Self-pay | Admitting: Hematology

## 2019-10-11 ENCOUNTER — Telehealth: Payer: Self-pay | Admitting: Hematology

## 2019-10-11 NOTE — Telephone Encounter (Signed)
Scheduled appt per 6/11 los.  Spoke with pt and they are aware of the appt date and time.

## 2019-10-13 DIAGNOSIS — R3129 Other microscopic hematuria: Secondary | ICD-10-CM | POA: Diagnosis not present

## 2019-10-13 DIAGNOSIS — N3281 Overactive bladder: Secondary | ICD-10-CM | POA: Diagnosis not present

## 2019-11-18 DIAGNOSIS — Z03818 Encounter for observation for suspected exposure to other biological agents ruled out: Secondary | ICD-10-CM | POA: Diagnosis not present

## 2019-11-18 DIAGNOSIS — J22 Unspecified acute lower respiratory infection: Secondary | ICD-10-CM | POA: Diagnosis not present

## 2019-11-18 DIAGNOSIS — R05 Cough: Secondary | ICD-10-CM | POA: Diagnosis not present

## 2019-12-30 DIAGNOSIS — Z03818 Encounter for observation for suspected exposure to other biological agents ruled out: Secondary | ICD-10-CM | POA: Diagnosis not present

## 2019-12-30 DIAGNOSIS — R509 Fever, unspecified: Secondary | ICD-10-CM | POA: Diagnosis not present

## 2019-12-30 DIAGNOSIS — R52 Pain, unspecified: Secondary | ICD-10-CM | POA: Diagnosis not present

## 2019-12-31 DIAGNOSIS — Z03818 Encounter for observation for suspected exposure to other biological agents ruled out: Secondary | ICD-10-CM | POA: Diagnosis not present

## 2019-12-31 DIAGNOSIS — R52 Pain, unspecified: Secondary | ICD-10-CM | POA: Diagnosis not present

## 2019-12-31 DIAGNOSIS — R509 Fever, unspecified: Secondary | ICD-10-CM | POA: Diagnosis not present

## 2020-01-07 ENCOUNTER — Inpatient Hospital Stay: Payer: Medicare HMO | Attending: Hematology

## 2020-01-14 ENCOUNTER — Telehealth: Payer: Self-pay | Admitting: Hematology

## 2020-01-14 ENCOUNTER — Other Ambulatory Visit: Payer: Medicare HMO

## 2020-01-14 NOTE — Telephone Encounter (Signed)
Called pt per 9/17 sch msg - no answer .unable to reach pt and unable to leave message.

## 2020-02-02 ENCOUNTER — Telehealth: Payer: Self-pay | Admitting: Hematology

## 2020-02-02 DIAGNOSIS — N644 Mastodynia: Secondary | ICD-10-CM | POA: Diagnosis not present

## 2020-02-02 DIAGNOSIS — N63 Unspecified lump in unspecified breast: Secondary | ICD-10-CM | POA: Diagnosis not present

## 2020-02-02 NOTE — Telephone Encounter (Signed)
Called pt per 10/6 sch msg - no answer. Left message for patient to call back to reschedule appt.

## 2020-02-03 ENCOUNTER — Other Ambulatory Visit: Payer: Self-pay | Admitting: Obstetrics and Gynecology

## 2020-02-03 DIAGNOSIS — N63 Unspecified lump in unspecified breast: Secondary | ICD-10-CM

## 2020-02-11 ENCOUNTER — Inpatient Hospital Stay: Payer: Medicare HMO | Attending: Hematology

## 2020-02-11 ENCOUNTER — Other Ambulatory Visit: Payer: Self-pay

## 2020-02-11 DIAGNOSIS — D509 Iron deficiency anemia, unspecified: Secondary | ICD-10-CM

## 2020-02-11 DIAGNOSIS — E559 Vitamin D deficiency, unspecified: Secondary | ICD-10-CM | POA: Diagnosis not present

## 2020-02-11 DIAGNOSIS — E509 Vitamin A deficiency, unspecified: Secondary | ICD-10-CM

## 2020-02-11 DIAGNOSIS — D649 Anemia, unspecified: Secondary | ICD-10-CM

## 2020-02-11 LAB — CBC WITH DIFFERENTIAL (CANCER CENTER ONLY)
Abs Immature Granulocytes: 0.03 10*3/uL (ref 0.00–0.07)
Basophils Absolute: 0 10*3/uL (ref 0.0–0.1)
Basophils Relative: 0 %
Eosinophils Absolute: 0.1 10*3/uL (ref 0.0–0.5)
Eosinophils Relative: 1 %
HCT: 41.7 % (ref 36.0–46.0)
Hemoglobin: 13.4 g/dL (ref 12.0–15.0)
Immature Granulocytes: 0 %
Lymphocytes Relative: 19 %
Lymphs Abs: 1.9 10*3/uL (ref 0.7–4.0)
MCH: 32.1 pg (ref 26.0–34.0)
MCHC: 32.1 g/dL (ref 30.0–36.0)
MCV: 100 fL (ref 80.0–100.0)
Monocytes Absolute: 0.7 10*3/uL (ref 0.1–1.0)
Monocytes Relative: 7 %
Neutro Abs: 7.2 10*3/uL (ref 1.7–7.7)
Neutrophils Relative %: 73 %
Platelet Count: 189 10*3/uL (ref 150–400)
RBC: 4.17 MIL/uL (ref 3.87–5.11)
RDW: 12.5 % (ref 11.5–15.5)
WBC Count: 9.9 10*3/uL (ref 4.0–10.5)
nRBC: 0 % (ref 0.0–0.2)

## 2020-02-11 LAB — IRON AND TIBC
Iron: 116 ug/dL (ref 41–142)
Saturation Ratios: 32 % (ref 21–57)
TIBC: 358 ug/dL (ref 236–444)
UIBC: 242 ug/dL (ref 120–384)

## 2020-02-11 LAB — VITAMIN D 25 HYDROXY (VIT D DEFICIENCY, FRACTURES): Vit D, 25-Hydroxy: 20.86 ng/mL — ABNORMAL LOW (ref 30–100)

## 2020-02-11 LAB — FERRITIN: Ferritin: 105 ng/mL (ref 11–307)

## 2020-02-17 LAB — COPPER, SERUM: Copper: 133 ug/dL (ref 80–158)

## 2020-02-17 LAB — ZINC: Zinc: 103 ug/dL (ref 44–115)

## 2020-02-17 LAB — VITAMIN C: Vitamin C: 0.8 mg/dL (ref 0.4–2.0)

## 2020-02-21 ENCOUNTER — Ambulatory Visit
Admission: RE | Admit: 2020-02-21 | Discharge: 2020-02-21 | Disposition: A | Discharge status: Home / Self Care | Payer: Medicare HMO | Source: Ambulatory Visit | Attending: Obstetrics and Gynecology | Admitting: Obstetrics and Gynecology

## 2020-02-21 ENCOUNTER — Other Ambulatory Visit: Payer: Self-pay

## 2020-02-21 DIAGNOSIS — R928 Other abnormal and inconclusive findings on diagnostic imaging of breast: Secondary | ICD-10-CM | POA: Diagnosis not present

## 2020-02-21 DIAGNOSIS — N63 Unspecified lump in unspecified breast: Secondary | ICD-10-CM

## 2020-02-21 DIAGNOSIS — N6321 Unspecified lump in the left breast, upper outer quadrant: Secondary | ICD-10-CM | POA: Diagnosis not present

## 2020-02-21 DIAGNOSIS — N6311 Unspecified lump in the right breast, upper outer quadrant: Secondary | ICD-10-CM | POA: Diagnosis not present

## 2020-03-14 ENCOUNTER — Telehealth: Payer: Self-pay | Admitting: *Deleted

## 2020-03-14 NOTE — Telephone Encounter (Signed)
Received call from scheduling that pt called & needs MVI infusion.  Returned call & pt is scheduled for 12/13 for MD & iron infusion.  She states that she needs MVI soon b/c she is feeling extremely tired, no energy, & sleeping is off.  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  She reports possibly being a little dehydrated & is drinking gatorade/powerade.  She just feels "Off".  Message to Dr Burr Medico.

## 2020-03-15 NOTE — Telephone Encounter (Signed)
Santiago Glad, could you please check with pharmacy when her last MVI infusion was. If it's been more than a month, please schedule one next week, and postpone her next lab, f/u and infusion for a month, thanks   Truitt Merle MD

## 2020-04-03 ENCOUNTER — Other Ambulatory Visit: Payer: Self-pay

## 2020-04-03 ENCOUNTER — Inpatient Hospital Stay: Payer: Medicare HMO | Attending: Hematology

## 2020-04-03 DIAGNOSIS — Z79899 Other long term (current) drug therapy: Secondary | ICD-10-CM | POA: Diagnosis not present

## 2020-04-03 DIAGNOSIS — D509 Iron deficiency anemia, unspecified: Secondary | ICD-10-CM

## 2020-04-03 DIAGNOSIS — R252 Cramp and spasm: Secondary | ICD-10-CM | POA: Insufficient documentation

## 2020-04-03 DIAGNOSIS — D539 Nutritional anemia, unspecified: Secondary | ICD-10-CM | POA: Diagnosis not present

## 2020-04-03 DIAGNOSIS — N920 Excessive and frequent menstruation with regular cycle: Secondary | ICD-10-CM | POA: Insufficient documentation

## 2020-04-03 DIAGNOSIS — G4700 Insomnia, unspecified: Secondary | ICD-10-CM | POA: Insufficient documentation

## 2020-04-03 DIAGNOSIS — Z86018 Personal history of other benign neoplasm: Secondary | ICD-10-CM | POA: Insufficient documentation

## 2020-04-03 DIAGNOSIS — Z9049 Acquired absence of other specified parts of digestive tract: Secondary | ICD-10-CM | POA: Diagnosis not present

## 2020-04-03 LAB — FERRITIN: Ferritin: 76 ng/mL (ref 11–307)

## 2020-04-03 LAB — CBC WITH DIFFERENTIAL (CANCER CENTER ONLY)
Abs Immature Granulocytes: 0.05 10*3/uL (ref 0.00–0.07)
Basophils Absolute: 0.1 10*3/uL (ref 0.0–0.1)
Basophils Relative: 0 %
Eosinophils Absolute: 0.1 10*3/uL (ref 0.0–0.5)
Eosinophils Relative: 1 %
HCT: 38.9 % (ref 36.0–46.0)
Hemoglobin: 12.6 g/dL (ref 12.0–15.0)
Immature Granulocytes: 0 %
Lymphocytes Relative: 10 %
Lymphs Abs: 1.7 10*3/uL (ref 0.7–4.0)
MCH: 32.6 pg (ref 26.0–34.0)
MCHC: 32.4 g/dL (ref 30.0–36.0)
MCV: 100.8 fL — ABNORMAL HIGH (ref 80.0–100.0)
Monocytes Absolute: 1.2 10*3/uL — ABNORMAL HIGH (ref 0.1–1.0)
Monocytes Relative: 7 %
Neutro Abs: 14.1 10*3/uL — ABNORMAL HIGH (ref 1.7–7.7)
Neutrophils Relative %: 82 %
Platelet Count: 190 10*3/uL (ref 150–400)
RBC: 3.86 MIL/uL — ABNORMAL LOW (ref 3.87–5.11)
RDW: 12 % (ref 11.5–15.5)
WBC Count: 17.2 10*3/uL — ABNORMAL HIGH (ref 4.0–10.5)
nRBC: 0 % (ref 0.0–0.2)

## 2020-04-03 LAB — IRON AND TIBC
Iron: 53 ug/dL (ref 41–142)
Saturation Ratios: 15 % — ABNORMAL LOW (ref 21–57)
TIBC: 360 ug/dL (ref 236–444)
UIBC: 308 ug/dL (ref 120–384)

## 2020-04-07 NOTE — Progress Notes (Signed)
[place]   [phone] [phone]) [phone]   Clinic Follow up Note   Patient Care Team: Maurice Small, MD as PCP - General (Family Medicine)  Date of Service:  04/10/2020  CHIEF COMPLAINT: F/u of anemia and Vitamin deficiencies   CURRENT THERAPY:  Feraheme, as needed, MVI and copper IV replacement as needed started in Aug 2016  INTERVAL HISTORY:  Mackenzie [name] is here for a follow up anemia and Vitamin deficiencies. She was last seen by me 6 months ago. She presents to the clinic with her family member. She notes with low Vit D level, she wonders could she go back to Vit D injections. She notes she has compound ingredients and if she can get that made somewhere. She also notes she contacted [facility] but will not be seen until February. She wonders can she check her Magnesium level as she had it done with Dr Justin Mend and it was lower. She is on 2 tabs magnesium daily.  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 She notes insomnia with interrupted sleep. She can sleep for 1-2 hours at a time. She has tried Melatonin but did not like the after affect. She tried low dose Ambien at half tablet which has helped.    REVIEW OF SYSTEMS:   Constitutional: Denies fevers, chills or abnormal weight loss (+) Trouble sleeping  Eyes: Denies blurriness of vision Ears, nose, mouth, throat, and face: Denies mucositis or sore throat Respiratory: Denies cough, dyspnea or wheezes Cardiovascular: Denies palpitation, chest discomfort or lower extremity swelling Gastrointestinal:  Denies nausea, heartburn or change in bowel habits Skin: Denies abnormal skin rashes Lymphatics: Denies new lymphadenopathy or easy bruising Neurological:Denies numbness, tingling or new weaknesses Behavioral/Psych: Mood is stable, no new changes  All other systems were reviewed with the patient and are negative.  MEDICAL HISTORY:  Past Medical History:  Diagnosis  Date  . Anemia    history  . Blind 1994  . Blood transfusion 04/2010   cancer center at [facility] long - iron transfusion per pt  . Breast lump left  . Depression    no meds  . H/O hiatal hernia    repaired with weight loss surgery  . History of uterine fibroid   . 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 Pseudotumor cerebri 1994   History - caused blind    SURGICAL HISTORY: Past Surgical History:  Procedure Laterality Date  . APPENDECTOMY     removed with switch surgery  . BREAST BIOPSY Right 07/09/2019   x2  . BREAST EXCISIONAL BIOPSY Left    fibroadenoma  . BREAST SURGERY  2012   right breast lumpectomy  . CHOLECYSTECTOMY     removed with switch surgery  . DILITATION & CURRETTAGE/HYSTROSCOPY WITH HYDROTHERMAL ABLATION N/A 12/16/2018   Procedure: DILATATION & CURETTAGE/HYSTEROSCOPY WITH HYDROTHERMAL ABLATION;  Surgeon: Janyth Pupa, DO;  Location: [place];  Service: Gynecology;  Laterality: N/A;  . eye decompression x3  1994   Bilateral   . FETAL BLOOD TRANSFUSION  jan 29,2012  . LIPOMA EXCISION Left 08/24/2019   Procedure: EXCISION LIPOMA LEFT UPPER CHEST;  Surgeon: Erroll Luna, MD;  Location: [place];  Service: General;  Laterality: Left;  . lp shunt  1994   removed in 2009  . MYOMECTOMY    . rurod switch  2007   weight loss surgery done in [place] (duodenal switch)  I have reviewed the social history and family history with the patient and they are unchanged from previous note.  ALLERGIES:  is allergic to influenza vaccines.  MEDICATIONS:  Current Outpatient Medications  Medication Sig Dispense Refill  . Calcium Carbonate (CALTRATE 600 PO) Take 2 tablets by mouth 2 (two) times daily.    . Cholecalciferol (VITAMIN D3 PO) Take 100,000 Units by mouth 2 (two) times daily.     . Cyanocobalamin (VITAMIN B-12 PO) Take 1 tablet by mouth daily.    Marland Kitchen HYDROcodone-acetaminophen  (NORCO/VICODIN) 5-325 MG tablet Take 1 tablet by mouth every 6 (six) hours as needed for moderate pain. 15 tablet 0  . ibuprofen (ADVIL) 800 MG tablet Take 1 tablet (800 mg total) by mouth every 8 (eight) hours as needed. 30 tablet 0  . Iron Heme Polypeptide 12 MG TABS Take by mouth 2 (two) times daily.    . Probiotic Product (PROBIOTIC PO) Take 1 capsule by mouth daily.    . sertraline (ZOLOFT) 50 MG tablet Take 50 mg by mouth daily.    . vitamin A 25000 UNIT capsule Take 50,000 Units by mouth daily.    . vitamin E 100 UNIT capsule Take by mouth. Not sure of dose    . Zolpidem Tartrate (AMBIEN PO) Take by mouth.     No current facility-administered medications for this visit.    PHYSICAL EXAMINATION: ECOG PERFORMANCE STATUS: 1 - Symptomatic but completely ambulatory  Vitals:   04/10/20 1330  BP: 136/81  Pulse: 89  Resp: 16  Temp: 98.7 F (37.1 C)  SpO2: 100%   Filed Weights   04/10/20 1330  Weight: 210 lb 3.2 oz (95.3 kg)    Due to COVID19 we will limit examination to appearance. Patient had no complaints.  GENERAL:alert, no distress and comfortable SKIN: skin color normal, no rashes or significant lesions EYES: normal, Conjunctiva are pink and non-injected, sclera clear  NEURO: alert & oriented x 3 with fluent speech   LABORATORY DATA:  I have reviewed the data as listed CBC Latest Ref Rng & Units 04/03/2020 02/11/2020 07/20/2019  WBC 4.0 - 10.5 K/uL 17.2(H) 9.9 10.4  Hemoglobin 12.0 - 15.0 g/dL 12.6 13.4 13.3  Hematocrit 36.0 - 46.0 % 38.9 41.7 41.2  Platelets 150 - 400 K/uL 190 189 191     CMP Latest Ref Rng & Units 05/04/2019 12/09/2018 11/25/2018  Glucose 70 - 99 mg/dL 130(H) 87 75  BUN 6 - 20 mg/dL 21(H) 12 14  Creatinine 0.44 - 1.00 mg/dL 0.77 0.70 0.77  Sodium 135 - 145 mmol/L 135 136 138  Potassium 3.5 - 5.1 mmol/L 3.7 5.0 3.8  Chloride 98 - 111 mmol/L 110 101 109  CO2 22 - 32 mmol/L 17(L) 28 23  Calcium 8.9 - 10.3 mg/dL 8.2(L) 9.0 8.7(L)  Total Protein 6.5  - 8.1 g/dL 7.8 7.5 7.8  Total Bilirubin 0.3 - 1.2 mg/dL 0.9 0.7 0.5  Alkaline Phos 38 - 126 U/L 53 63 69  AST 15 - 41 U/L 27 33 38  ALT 0 - 44 U/L 22 24 54(H)      RADIOGRAPHIC STUDIES: I have personally reviewed the radiological images as listed and agreed with the findings in the report. No results found.   ASSESSMENT & PLAN:  REE ALCALDE is a 44 y.o. female with   1. Nutritional anemia -She was diagnosed with iron deficient anemia secondary to her duodenal switch procedure, and heavy menstrual period. She responded well to IV iron. -She  now has macrocytic anemia, but the I20 and folic acid level are normal. She also haslow Cooper and zinc level, which may also contribute to her anemia -She is being treated with oralmultivitaminand multivitamininfusion. Sheis also on oral Heme iron 12mg  4 times daily. She received IV iron needed, last dose in 03/29/19 -She underwent D&C ablation on 12/16/18. She has not had period since. This will certainly decrease her need for iv iron  -Latest labs from 04/03/20 show WBC 17.2, No anemia, ANC 14.1. Ferritin 76, Sat ratio 15, iron 53. Will proceed with Vitamin Infusion in the next 2 weeks, per pt request.  -F/u open.   2. Other vitamin deficiency  -She has received 600K U Vit D and 50K U VitA at Dr. Lucinda Dell office in 2016, now on high oral dose  -She received multivitamin 76ml (containing vitamin 400 unit, vitamin A 6600 unit) intravenous infusion in1/2016,8/2016and laston 12/17/17, about every 3 months.Responded well. -She has been receiving Multivitamin Infusion every 3 monthsif level is low.  Her recent vitamin D level was very low -She used to have vitamin D IM injection at her plastic surgeon's office in [place]. We do not carry Vit D compound injection so far.  -She plans to consult with [facility] in 05/2020. She may proceed with Vit D injections there.  -Due to recent mildly low magnesium, she is on oral  magnesium 2 tabs daily.   3. Hypocalcemia -Hypocalcemia could be related to her vitamin D deficiency. She'll continue oral calcium and vitamin D. -She still has leg cramps. I encouraged her to take oral magnesium as well.  4. Insomnia -She only gets 1-2 hours of sleep at a time.  -She tried Melatonin but did not tolerate. She is on low dose Ambien at half tablet. She is willing to increase dose if tolerable.    Plan -Reschedule multivitamin infusion from today to anytime in the next 2 weeks.  -Consult with [facility] in 05/2020 -she will send me the formula of VitD injection, and I will check with our pharmacy  -F/u with me open, she will call me if needed   No problem-specific Assessment & Plan notes found for this encounter.   No orders of the defined types were placed in this encounter.  All questions were answered. The patient knows to call the clinic with any problems, questions or concerns. No barriers to learning was detected. The total time spent in the appointment was 25 minutes.     Truitt Merle, MD 04/10/2020   I, Joslyn Devon, am acting as scribe for Truitt Merle, MD.   I have reviewed the above documentation for accuracy and completeness, and I agree with the above.

## 2020-04-10 ENCOUNTER — Encounter: Payer: Self-pay | Admitting: Hematology

## 2020-04-10 ENCOUNTER — Other Ambulatory Visit: Payer: Self-pay

## 2020-04-10 ENCOUNTER — Inpatient Hospital Stay (HOSPITAL_BASED_OUTPATIENT_CLINIC_OR_DEPARTMENT_OTHER): Payer: Medicare HMO | Admitting: Hematology

## 2020-04-10 ENCOUNTER — Inpatient Hospital Stay: Payer: Medicare HMO

## 2020-04-10 VITALS — BP 136/81 | HR 89 | Temp 98.7°F | Resp 16 | Wt 210.2 lb

## 2020-04-10 DIAGNOSIS — D539 Nutritional anemia, unspecified: Secondary | ICD-10-CM | POA: Diagnosis not present

## 2020-04-10 DIAGNOSIS — D509 Iron deficiency anemia, unspecified: Secondary | ICD-10-CM

## 2020-04-10 DIAGNOSIS — E509 Vitamin A deficiency, unspecified: Secondary | ICD-10-CM

## 2020-04-10 DIAGNOSIS — G4700 Insomnia, unspecified: Secondary | ICD-10-CM | POA: Diagnosis not present

## 2020-04-10 DIAGNOSIS — Z86018 Personal history of other benign neoplasm: Secondary | ICD-10-CM | POA: Diagnosis not present

## 2020-04-10 DIAGNOSIS — E559 Vitamin D deficiency, unspecified: Secondary | ICD-10-CM

## 2020-04-10 DIAGNOSIS — N920 Excessive and frequent menstruation with regular cycle: Secondary | ICD-10-CM | POA: Diagnosis not present

## 2020-04-10 DIAGNOSIS — Z9049 Acquired absence of other specified parts of digestive tract: Secondary | ICD-10-CM | POA: Diagnosis not present

## 2020-04-10 DIAGNOSIS — R252 Cramp and spasm: Secondary | ICD-10-CM | POA: Diagnosis not present

## 2020-04-10 DIAGNOSIS — Z79899 Other long term (current) drug therapy: Secondary | ICD-10-CM | POA: Diagnosis not present

## 2020-04-12 ENCOUNTER — Telehealth: Payer: Self-pay | Admitting: Hematology

## 2020-04-12 NOTE — Telephone Encounter (Signed)
Scheduled appts per 12/13 los. Left voicemail with appt date and time.  

## 2020-04-27 ENCOUNTER — Other Ambulatory Visit: Payer: Self-pay

## 2020-04-27 ENCOUNTER — Inpatient Hospital Stay: Payer: Medicare HMO

## 2020-04-27 VITALS — BP 99/63 | HR 80 | Temp 97.7°F | Resp 17 | Wt 211.5 lb

## 2020-04-27 DIAGNOSIS — R252 Cramp and spasm: Secondary | ICD-10-CM | POA: Diagnosis not present

## 2020-04-27 DIAGNOSIS — D509 Iron deficiency anemia, unspecified: Secondary | ICD-10-CM

## 2020-04-27 DIAGNOSIS — G4700 Insomnia, unspecified: Secondary | ICD-10-CM | POA: Diagnosis not present

## 2020-04-27 DIAGNOSIS — Z79899 Other long term (current) drug therapy: Secondary | ICD-10-CM | POA: Diagnosis not present

## 2020-04-27 DIAGNOSIS — Z9049 Acquired absence of other specified parts of digestive tract: Secondary | ICD-10-CM | POA: Diagnosis not present

## 2020-04-27 DIAGNOSIS — D539 Nutritional anemia, unspecified: Secondary | ICD-10-CM | POA: Diagnosis not present

## 2020-04-27 DIAGNOSIS — Z86018 Personal history of other benign neoplasm: Secondary | ICD-10-CM | POA: Diagnosis not present

## 2020-04-27 DIAGNOSIS — N920 Excessive and frequent menstruation with regular cycle: Secondary | ICD-10-CM | POA: Diagnosis not present

## 2020-04-27 MED ORDER — SODIUM CHLORIDE 0.9 % IV SOLN
Freq: Once | INTRAVENOUS | Status: AC
  Filled 2020-04-27: qty 250

## 2020-04-27 MED ORDER — SODIUM CHLORIDE 0.9 % IV SOLN
510.0000 mg | Freq: Once | INTRAVENOUS | Status: AC
  Administered 2020-04-27: 510 mg via INTRAVENOUS
  Filled 2020-04-27: qty 510

## 2020-04-27 MED ORDER — SODIUM CHLORIDE 0.9 % IV SOLN
Freq: Once | INTRAVENOUS | Status: AC
  Filled 2020-04-27: qty 1000

## 2020-04-27 NOTE — Patient Instructions (Signed)

## 2020-06-07 DIAGNOSIS — E669 Obesity, unspecified: Secondary | ICD-10-CM | POA: Diagnosis not present

## 2020-06-07 DIAGNOSIS — Z9884 Bariatric surgery status: Secondary | ICD-10-CM | POA: Diagnosis not present

## 2020-06-07 DIAGNOSIS — Z713 Dietary counseling and surveillance: Secondary | ICD-10-CM | POA: Diagnosis not present

## 2020-06-07 DIAGNOSIS — Z6833 Body mass index (BMI) 33.0-33.9, adult: Secondary | ICD-10-CM | POA: Diagnosis not present

## 2020-06-07 DIAGNOSIS — K912 Postsurgical malabsorption, not elsewhere classified: Secondary | ICD-10-CM | POA: Diagnosis not present

## 2020-07-17 DIAGNOSIS — Z01419 Encounter for gynecological examination (general) (routine) without abnormal findings: Secondary | ICD-10-CM | POA: Diagnosis not present

## 2020-07-20 DIAGNOSIS — Z713 Dietary counseling and surveillance: Secondary | ICD-10-CM | POA: Diagnosis not present

## 2020-07-20 DIAGNOSIS — E559 Vitamin D deficiency, unspecified: Secondary | ICD-10-CM | POA: Diagnosis not present

## 2020-07-20 DIAGNOSIS — D72829 Elevated white blood cell count, unspecified: Secondary | ICD-10-CM | POA: Diagnosis not present

## 2020-07-20 DIAGNOSIS — E561 Deficiency of vitamin K: Secondary | ICD-10-CM | POA: Diagnosis not present

## 2020-07-20 DIAGNOSIS — K912 Postsurgical malabsorption, not elsewhere classified: Secondary | ICD-10-CM | POA: Diagnosis not present

## 2020-07-20 DIAGNOSIS — Z9884 Bariatric surgery status: Secondary | ICD-10-CM | POA: Diagnosis not present

## 2020-07-21 DIAGNOSIS — R059 Cough, unspecified: Secondary | ICD-10-CM | POA: Diagnosis not present

## 2020-07-25 DIAGNOSIS — Z03818 Encounter for observation for suspected exposure to other biological agents ruled out: Secondary | ICD-10-CM | POA: Diagnosis not present

## 2020-07-25 DIAGNOSIS — R059 Cough, unspecified: Secondary | ICD-10-CM | POA: Diagnosis not present

## 2020-08-02 DIAGNOSIS — Z720 Tobacco use: Secondary | ICD-10-CM | POA: Diagnosis not present

## 2020-08-02 DIAGNOSIS — J301 Allergic rhinitis due to pollen: Secondary | ICD-10-CM | POA: Diagnosis not present

## 2020-08-02 DIAGNOSIS — J0100 Acute maxillary sinusitis, unspecified: Secondary | ICD-10-CM | POA: Diagnosis not present

## 2020-08-30 DIAGNOSIS — N3281 Overactive bladder: Secondary | ICD-10-CM | POA: Diagnosis not present

## 2020-11-14 DIAGNOSIS — E509 Vitamin A deficiency, unspecified: Secondary | ICD-10-CM | POA: Diagnosis not present

## 2020-11-14 DIAGNOSIS — E538 Deficiency of other specified B group vitamins: Secondary | ICD-10-CM | POA: Diagnosis not present

## 2020-11-14 DIAGNOSIS — E559 Vitamin D deficiency, unspecified: Secondary | ICD-10-CM | POA: Diagnosis not present

## 2020-11-14 DIAGNOSIS — E785 Hyperlipidemia, unspecified: Secondary | ICD-10-CM | POA: Diagnosis not present

## 2020-11-14 DIAGNOSIS — Z0000 Encounter for general adult medical examination without abnormal findings: Secondary | ICD-10-CM | POA: Diagnosis not present

## 2020-11-14 DIAGNOSIS — D509 Iron deficiency anemia, unspecified: Secondary | ICD-10-CM | POA: Diagnosis not present

## 2020-11-14 DIAGNOSIS — E213 Hyperparathyroidism, unspecified: Secondary | ICD-10-CM | POA: Diagnosis not present

## 2020-11-14 DIAGNOSIS — Z5181 Encounter for therapeutic drug level monitoring: Secondary | ICD-10-CM | POA: Diagnosis not present

## 2020-11-14 DIAGNOSIS — Z9884 Bariatric surgery status: Secondary | ICD-10-CM | POA: Diagnosis not present

## 2020-12-07 DIAGNOSIS — R350 Frequency of micturition: Secondary | ICD-10-CM | POA: Insufficient documentation

## 2020-12-07 DIAGNOSIS — R3915 Urgency of urination: Secondary | ICD-10-CM | POA: Insufficient documentation

## 2021-01-11 DIAGNOSIS — J019 Acute sinusitis, unspecified: Secondary | ICD-10-CM | POA: Diagnosis not present

## 2021-01-25 DIAGNOSIS — N3281 Overactive bladder: Secondary | ICD-10-CM | POA: Diagnosis not present

## 2021-01-25 DIAGNOSIS — R350 Frequency of micturition: Secondary | ICD-10-CM | POA: Diagnosis not present

## 2021-01-25 DIAGNOSIS — N812 Incomplete uterovaginal prolapse: Secondary | ICD-10-CM | POA: Diagnosis not present

## 2021-01-25 DIAGNOSIS — R159 Full incontinence of feces: Secondary | ICD-10-CM | POA: Insufficient documentation

## 2021-01-25 DIAGNOSIS — E119 Type 2 diabetes mellitus without complications: Secondary | ICD-10-CM | POA: Diagnosis not present

## 2021-01-25 DIAGNOSIS — N3946 Mixed incontinence: Secondary | ICD-10-CM | POA: Diagnosis not present

## 2021-01-25 DIAGNOSIS — N9419 Other specified dyspareunia: Secondary | ICD-10-CM | POA: Diagnosis not present

## 2021-01-25 DIAGNOSIS — N393 Stress incontinence (female) (male): Secondary | ICD-10-CM | POA: Insufficient documentation

## 2021-02-02 DIAGNOSIS — R350 Frequency of micturition: Secondary | ICD-10-CM | POA: Diagnosis not present

## 2021-02-02 DIAGNOSIS — M6281 Muscle weakness (generalized): Secondary | ICD-10-CM | POA: Diagnosis not present

## 2021-02-02 DIAGNOSIS — N393 Stress incontinence (female) (male): Secondary | ICD-10-CM | POA: Diagnosis not present

## 2021-02-02 DIAGNOSIS — M62838 Other muscle spasm: Secondary | ICD-10-CM | POA: Diagnosis not present

## 2021-02-02 DIAGNOSIS — R159 Full incontinence of feces: Secondary | ICD-10-CM | POA: Diagnosis not present

## 2021-02-02 DIAGNOSIS — N941 Unspecified dyspareunia: Secondary | ICD-10-CM | POA: Diagnosis not present

## 2021-02-02 DIAGNOSIS — N3281 Overactive bladder: Secondary | ICD-10-CM | POA: Diagnosis not present

## 2021-02-02 DIAGNOSIS — R3915 Urgency of urination: Secondary | ICD-10-CM | POA: Diagnosis not present

## 2021-02-02 DIAGNOSIS — R102 Pelvic and perineal pain: Secondary | ICD-10-CM | POA: Diagnosis not present

## 2021-03-09 DIAGNOSIS — N393 Stress incontinence (female) (male): Secondary | ICD-10-CM | POA: Diagnosis not present

## 2021-03-09 DIAGNOSIS — R350 Frequency of micturition: Secondary | ICD-10-CM | POA: Diagnosis not present

## 2021-03-09 DIAGNOSIS — R102 Pelvic and perineal pain: Secondary | ICD-10-CM | POA: Diagnosis not present

## 2021-03-09 DIAGNOSIS — R159 Full incontinence of feces: Secondary | ICD-10-CM | POA: Diagnosis not present

## 2021-03-09 DIAGNOSIS — N3941 Urge incontinence: Secondary | ICD-10-CM | POA: Diagnosis not present

## 2021-03-09 DIAGNOSIS — M62838 Other muscle spasm: Secondary | ICD-10-CM | POA: Diagnosis not present

## 2021-03-09 DIAGNOSIS — M6281 Muscle weakness (generalized): Secondary | ICD-10-CM | POA: Diagnosis not present

## 2021-03-09 DIAGNOSIS — N3281 Overactive bladder: Secondary | ICD-10-CM | POA: Diagnosis not present

## 2021-03-09 DIAGNOSIS — N941 Unspecified dyspareunia: Secondary | ICD-10-CM | POA: Diagnosis not present

## 2021-03-13 DIAGNOSIS — M79675 Pain in left toe(s): Secondary | ICD-10-CM | POA: Diagnosis not present

## 2021-03-28 ENCOUNTER — Telehealth: Payer: Self-pay | Admitting: Hematology

## 2021-03-28 NOTE — Telephone Encounter (Signed)
Scheduled follow-up appointment per 11/30 staff message. Patient is aware.

## 2021-03-29 ENCOUNTER — Inpatient Hospital Stay: Payer: Medicare HMO | Attending: Hematology | Admitting: Hematology

## 2021-03-29 ENCOUNTER — Encounter: Payer: Self-pay | Admitting: Hematology

## 2021-03-29 ENCOUNTER — Other Ambulatory Visit: Payer: Self-pay

## 2021-03-29 ENCOUNTER — Inpatient Hospital Stay: Payer: Medicare HMO

## 2021-03-29 VITALS — BP 127/81 | HR 83 | Temp 98.3°F | Resp 18 | Ht 66.0 in | Wt 215.9 lb

## 2021-03-29 DIAGNOSIS — E569 Vitamin deficiency, unspecified: Secondary | ICD-10-CM | POA: Insufficient documentation

## 2021-03-29 DIAGNOSIS — D500 Iron deficiency anemia secondary to blood loss (chronic): Secondary | ICD-10-CM | POA: Diagnosis not present

## 2021-03-29 DIAGNOSIS — E559 Vitamin D deficiency, unspecified: Secondary | ICD-10-CM

## 2021-03-29 DIAGNOSIS — Z9049 Acquired absence of other specified parts of digestive tract: Secondary | ICD-10-CM | POA: Insufficient documentation

## 2021-03-29 DIAGNOSIS — Z887 Allergy status to serum and vaccine status: Secondary | ICD-10-CM | POA: Diagnosis not present

## 2021-03-29 DIAGNOSIS — E119 Type 2 diabetes mellitus without complications: Secondary | ICD-10-CM | POA: Diagnosis not present

## 2021-03-29 DIAGNOSIS — Z79899 Other long term (current) drug therapy: Secondary | ICD-10-CM | POA: Diagnosis not present

## 2021-03-29 DIAGNOSIS — R252 Cramp and spasm: Secondary | ICD-10-CM | POA: Diagnosis not present

## 2021-03-29 DIAGNOSIS — E509 Vitamin A deficiency, unspecified: Secondary | ICD-10-CM

## 2021-03-29 DIAGNOSIS — N920 Excessive and frequent menstruation with regular cycle: Secondary | ICD-10-CM | POA: Insufficient documentation

## 2021-03-29 DIAGNOSIS — G4700 Insomnia, unspecified: Secondary | ICD-10-CM | POA: Diagnosis not present

## 2021-03-29 DIAGNOSIS — Z86018 Personal history of other benign neoplasm: Secondary | ICD-10-CM | POA: Insufficient documentation

## 2021-03-29 DIAGNOSIS — D509 Iron deficiency anemia, unspecified: Secondary | ICD-10-CM | POA: Diagnosis not present

## 2021-03-29 DIAGNOSIS — D649 Anemia, unspecified: Secondary | ICD-10-CM

## 2021-03-29 LAB — COMPREHENSIVE METABOLIC PANEL
ALT: 19 U/L (ref 0–44)
AST: 21 U/L (ref 15–41)
Albumin: 4 g/dL (ref 3.5–5.0)
Alkaline Phosphatase: 55 U/L (ref 38–126)
Anion gap: 7 (ref 5–15)
BUN: 15 mg/dL (ref 6–20)
CO2: 24 mmol/L (ref 22–32)
Calcium: 8.4 mg/dL — ABNORMAL LOW (ref 8.9–10.3)
Chloride: 108 mmol/L (ref 98–111)
Creatinine, Ser: 0.75 mg/dL (ref 0.44–1.00)
GFR, Estimated: >60 mL/min (ref >60)
Glucose, Bld: 83 mg/dL (ref 70–99)
Potassium: 4.1 mmol/L (ref 3.5–5.1)
Sodium: 139 mmol/L (ref 135–145)
Total Bilirubin: 0.4 mg/dL (ref 0.3–1.2)
Total Protein: 7.3 g/dL (ref 6.5–8.1)

## 2021-03-29 LAB — CBC WITH DIFFERENTIAL (CANCER CENTER ONLY)
Abs Immature Granulocytes: 0.04 10*3/uL (ref 0.00–0.07)
Basophils Absolute: 0 10*3/uL (ref 0.0–0.1)
Basophils Relative: 0 %
Eosinophils Absolute: 0.1 10*3/uL (ref 0.0–0.5)
Eosinophils Relative: 1 %
HCT: 37.6 % (ref 36.0–46.0)
Hemoglobin: 12.5 g/dL (ref 12.0–15.0)
Immature Granulocytes: 0 %
Lymphocytes Relative: 14 %
Lymphs Abs: 1.5 10*3/uL (ref 0.7–4.0)
MCH: 32.9 pg (ref 26.0–34.0)
MCHC: 33.2 g/dL (ref 30.0–36.0)
MCV: 98.9 fL (ref 80.0–100.0)
Monocytes Absolute: 0.6 10*3/uL (ref 0.1–1.0)
Monocytes Relative: 6 %
Neutro Abs: 8.6 10*3/uL — ABNORMAL HIGH (ref 1.7–7.7)
Neutrophils Relative %: 79 %
Platelet Count: 211 10*3/uL (ref 150–400)
RBC: 3.8 MIL/uL — ABNORMAL LOW (ref 3.87–5.11)
RDW: 12.4 % (ref 11.5–15.5)
WBC Count: 10.9 10*3/uL — ABNORMAL HIGH (ref 4.0–10.5)
nRBC: 0 % (ref 0.0–0.2)

## 2021-03-29 LAB — MAGNESIUM: Magnesium: 2 mg/dL (ref 1.7–2.4)

## 2021-03-29 LAB — IRON AND TIBC
Iron: 72 ug/dL (ref 41–142)
Saturation Ratios: 22 % (ref 21–57)
TIBC: 331 ug/dL (ref 236–444)
UIBC: 259 ug/dL (ref 120–384)

## 2021-03-29 LAB — FERRITIN: Ferritin: 97 ng/mL (ref 11–307)

## 2021-03-29 LAB — VITAMIN D 25 HYDROXY (VIT D DEFICIENCY, FRACTURES): Vit D, 25-Hydroxy: 22.63 ng/mL — ABNORMAL LOW (ref 30–100)

## 2021-03-29 NOTE — Progress Notes (Signed)
[place]   Telephone:(336) [phone] [phone]   Clinic Follow up Note   Patient Care Team: Maurice Small, MD (Inactive) as PCP - General (Family Medicine)  Date of Service:  03/29/2021  CHIEF COMPLAINT: f/u of anemia and Vitamin deficiencies  CURRENT THERAPY:  Feraheme, as needed, MVI and copper IV replacement as needed started in Aug 2016   ASSESSMENT & PLAN:  Mackenzie [name] is a 45 y.o. female with   1. Nutritional anemia -She was diagnosed with iron deficient anemia secondary to her duodenal switch procedure, and heavy menstrual period. She responded well to IV iron. -She now has macrocytic anemia,  but the S49 and folic acid level are normal. She also has low Cooper and zinc level, which may also contribute to her anemia -She is being treated with oral multivitamin and multivitamin infusion. She is also on oral Heme iron 12mg  4 times daily. She received IV iron needed, last dose in 03/29/19 -She underwent D&C ablation on 12/16/18. She has not had period since. This will certainly decrease her need for iv iron  -she has not had lab work done since 05/2020. We will obtain repeat today. -phone visit in 9 months with lab 3-5 day before.   2. Other vitamin deficiency  -She has received 600K U Vit D and 50K U VitA at Dr. Lucinda Dell office in 2016, now on high oral dose  -She received multivitamin 31ml (containing vitamin 400 unit, vitamin A 6600 unit) intravenous infusion in 04/2014, 11/2014 and last on 12/17/17, about every 3 months. Responded well.  -She has been receiving Multivitamin Infusion every 3 months if level is low.  Her recent vitamin D level was very low -She used to have vitamin D IM injection at her plastic surgeon's office in [place]. We do not carry Vit D compound injection so far.  -She plans to consult with [facility] in 05/2020. She may proceed with Vit D injections there.  -Due to recent mildly low magnesium, she is on oral  magnesium 2 tabs daily.    3. Hypocalcemia -Hypocalcemia could be related to her vitamin D deficiency. She'll continue oral calcium and vitamin D. -She still has leg cramps. I encouraged her to take oral magnesium as well.    4. Insomnia -She only gets 1-2 hours of sleep at a time.  -She tried Melatonin but did not tolerate. She is on low dose Ambien at half tablet. She is willing to increase dose if tolerable.      Plan -lab today, will also check magnesium, will call her with results and set up MVI infusion if needed  -phone visit in 9 months with lab 3-5 day before.   No problem-specific Assessment & Plan notes found for this encounter.   INTERVAL HISTORY:  Mackenzie [name] is here for a follow up of anemia and vit deficiencies. She was last seen by me on 04/10/20. She presents to the clinic accompanied by a family member. She reports she was seen at [facility] since her last visit. She reports they did not give her any infusions, and she has not seen them since 06/2020.   All other systems were reviewed with the patient and are negative.  MEDICAL HISTORY:  Past Medical History:  Diagnosis Date   Anemia    history   Blind 1994   Blood transfusion 04/2010   cancer center at [facility] - iron transfusion per pt   Breast lump left   Depression  no meds   H/O hiatal hernia    repaired with weight loss surgery   History of uterine fibroid    Neuromuscular disorder ([place])    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   Pseudotumor cerebri 1994   History - caused blind    SURGICAL HISTORY: Past Surgical History:  Procedure Laterality Date   APPENDECTOMY     removed with switch surgery   BREAST BIOPSY Right 07/09/2019   x2   BREAST EXCISIONAL BIOPSY Left    fibroadenoma   BREAST SURGERY  2012   right breast lumpectomy   CHOLECYSTECTOMY     removed with switch surgery   DILITATION & CURRETTAGE/HYSTROSCOPY WITH HYDROTHERMAL ABLATION N/A 12/16/2018   Procedure:  DILATATION & CURETTAGE/HYSTEROSCOPY WITH HYDROTHERMAL ABLATION;  Surgeon: Janyth Pupa, DO;  Location: [place];  Service: Gynecology;  Laterality: N/A;   eye decompression x3  1994   Bilateral    FETAL BLOOD TRANSFUSION  jan 29,2012   LIPOMA EXCISION Left 08/24/2019   Procedure: EXCISION LIPOMA LEFT UPPER CHEST;  Surgeon: Erroll Luna, MD;  Location: [place];  Service: General;  Laterality: Left;   lp shunt  1994   removed in 2009   MYOMECTOMY     rurod switch  2007   weight loss surgery done in [place] (duodenal switch)    I have reviewed the social history and family history with the patient and they are unchanged from previous note.  ALLERGIES:  is allergic to influenza vaccines.  MEDICATIONS:  Current Outpatient Medications  Medication Sig Dispense Refill   Calcium Carbonate (CALTRATE 600 PO) Take 2 tablets by mouth 2 (two) times daily.     Cholecalciferol (VITAMIN D3 PO) Take 100,000 Units by mouth 2 (two) times daily.      Cyanocobalamin (VITAMIN B-12 PO) Take 1 tablet by mouth daily.     HYDROcodone-acetaminophen (NORCO/VICODIN) 5-325 MG tablet Take 1 tablet by mouth every 6 (six) hours as needed for moderate pain. 15 tablet 0   ibuprofen (ADVIL) 800 MG tablet Take 1 tablet (800 mg total) by mouth every 8 (eight) hours as needed. 30 tablet 0   Iron Heme Polypeptide 12 MG TABS Take by mouth 2 (two) times daily.     Probiotic Product (PROBIOTIC PO) Take 1 capsule by mouth daily.     sertraline (ZOLOFT) 50 MG tablet Take 50 mg by mouth daily.     vitamin A 25000 UNIT capsule Take 50,000 Units by mouth daily.     vitamin E 100 UNIT capsule Take by mouth. Not sure of dose     Zolpidem Tartrate (AMBIEN PO) Take by mouth.     No current facility-administered medications for this visit.    PHYSICAL EXAMINATION: ECOG PERFORMANCE STATUS: 1 - Symptomatic but completely ambulatory  Vitals:   03/29/21 1020  BP: 127/81  Pulse: 83  Resp: 18   Temp: 98.3 F (36.8 C)  SpO2: 99%   Wt Readings from Last 3 Encounters:  03/29/21 215 lb 14.4 oz (97.9 kg)  04/27/20 211 lb 8 oz (95.9 kg)  04/10/20 210 lb 3.2 oz (95.3 kg)     GENERAL:alert, no distress and comfortable SKIN: skin color normal, no rashes or significant lesions EYES: normal, Conjunctiva are pink and non-injected, sclera clear  NEURO: alert & oriented x 3 with fluent speech  LABORATORY DATA:  I have reviewed the data as listed CBC Latest Ref Rng & Units 03/29/2021 04/03/2020 02/11/2020  WBC 4.0 - 10.5  K/uL 10.9(H) 17.2(H) 9.9  Hemoglobin 12.0 - 15.0 g/dL 12.5 12.6 13.4  Hematocrit 36.0 - 46.0 % 37.6 38.9 41.7  Platelets 150 - 400 K/uL 211 190 189     CMP Latest Ref Rng & Units 03/29/2021 05/04/2019 12/09/2018  Glucose 70 - 99 mg/dL 83 130(H) 87  BUN 6 - 20 mg/dL 15 21(H) 12  Creatinine 0.44 - 1.00 mg/dL 0.75 0.77 0.70  Sodium 135 - 145 mmol/L 139 135 136  Potassium 3.5 - 5.1 mmol/L 4.1 3.7 5.0  Chloride 98 - 111 mmol/L 108 110 101  CO2 22 - 32 mmol/L 24 17(L) 28  Calcium 8.9 - 10.3 mg/dL 8.4(L) 8.2(L) 9.0  Total Protein 6.5 - 8.1 g/dL 7.3 7.8 7.5  Total Bilirubin 0.3 - 1.2 mg/dL 0.4 0.9 0.7  Alkaline Phos 38 - 126 U/L 55 53 63  AST 15 - 41 U/L 21 27 33  ALT 0 - 44 U/L 19 22 24       RADIOGRAPHIC STUDIES: I have personally reviewed the radiological images as listed and agreed with the findings in the report. No results found.    Orders Placed This Encounter  Procedures   Comprehensive metabolic panel    Standing Status:   Standing    Number of Occurrences:   50    Standing Expiration Date:   03/29/2022   Magnesium    Standing Status:   Future    Number of Occurrences:   1    Standing Expiration Date:   03/29/2022   All questions were answered. The patient knows to call the clinic with any problems, questions or concerns. No barriers to learning was detected. The total time spent in the appointment was 20 minutes.     Truitt Merle, MD 03/29/2021   I,  Wilburn Mylar, am acting as scribe for Truitt Merle, MD.   I have reviewed the above documentation for accuracy and completeness, and I agree with the above.

## 2021-03-30 DIAGNOSIS — M62838 Other muscle spasm: Secondary | ICD-10-CM | POA: Diagnosis not present

## 2021-03-30 DIAGNOSIS — N3946 Mixed incontinence: Secondary | ICD-10-CM | POA: Diagnosis not present

## 2021-03-30 DIAGNOSIS — L905 Scar conditions and fibrosis of skin: Secondary | ICD-10-CM | POA: Diagnosis not present

## 2021-03-30 DIAGNOSIS — N941 Unspecified dyspareunia: Secondary | ICD-10-CM | POA: Diagnosis not present

## 2021-03-30 DIAGNOSIS — N3281 Overactive bladder: Secondary | ICD-10-CM | POA: Diagnosis not present

## 2021-03-30 DIAGNOSIS — R350 Frequency of micturition: Secondary | ICD-10-CM | POA: Diagnosis not present

## 2021-03-30 DIAGNOSIS — R102 Pelvic and perineal pain: Secondary | ICD-10-CM | POA: Diagnosis not present

## 2021-03-30 DIAGNOSIS — M6281 Muscle weakness (generalized): Secondary | ICD-10-CM | POA: Diagnosis not present

## 2021-03-30 DIAGNOSIS — R159 Full incontinence of feces: Secondary | ICD-10-CM | POA: Diagnosis not present

## 2021-03-31 LAB — VITAMIN C: Vitamin C: 0.3 mg/dL — ABNORMAL LOW (ref 0.4–2.0)

## 2021-04-01 LAB — ZINC: Zinc: 56 ug/dL (ref 44–115)

## 2021-04-01 LAB — VITAMIN A: Vitamin A (Retinoic Acid): 40.2 ug/dL (ref 20.1–62.0)

## 2021-04-01 LAB — COPPER, SERUM: Copper: 75 ug/dL — ABNORMAL LOW (ref 80–158)

## 2021-04-06 DIAGNOSIS — M62838 Other muscle spasm: Secondary | ICD-10-CM | POA: Diagnosis not present

## 2021-04-06 DIAGNOSIS — R350 Frequency of micturition: Secondary | ICD-10-CM | POA: Diagnosis not present

## 2021-04-06 DIAGNOSIS — N941 Unspecified dyspareunia: Secondary | ICD-10-CM | POA: Diagnosis not present

## 2021-04-06 DIAGNOSIS — N3946 Mixed incontinence: Secondary | ICD-10-CM | POA: Diagnosis not present

## 2021-04-06 DIAGNOSIS — R159 Full incontinence of feces: Secondary | ICD-10-CM | POA: Diagnosis not present

## 2021-04-06 DIAGNOSIS — L905 Scar conditions and fibrosis of skin: Secondary | ICD-10-CM | POA: Diagnosis not present

## 2021-04-06 DIAGNOSIS — N3281 Overactive bladder: Secondary | ICD-10-CM | POA: Diagnosis not present

## 2021-04-06 DIAGNOSIS — R102 Pelvic and perineal pain: Secondary | ICD-10-CM | POA: Diagnosis not present

## 2021-04-06 DIAGNOSIS — M6281 Muscle weakness (generalized): Secondary | ICD-10-CM | POA: Diagnosis not present

## 2021-05-01 IMAGING — US US BREAST*L* LIMITED INC AXILLA
1 series · 8 of 8 positions shown · non-contrast
Comparison: Previous exam(s).

CLINICAL DATA: 44-year-old female status post benign
ultrasound-guided biopsies of the right breast with additional
six-month follow-up recommended of a probably benign mass in the
left breast. Questioned palpated lumps at the right breast 11
o'clock position in left breast 1 o'clock position.

EXAM:
DIGITAL DIAGNOSTIC BILATERAL MAMMOGRAM WITH CAD AND TOMO
ULTRASOUND BILATERAL BREAST

[Series 1: us breast*left* limited inc axilla · 0.07mm/px · 8 of 8 slices shown]
[im 1/8]
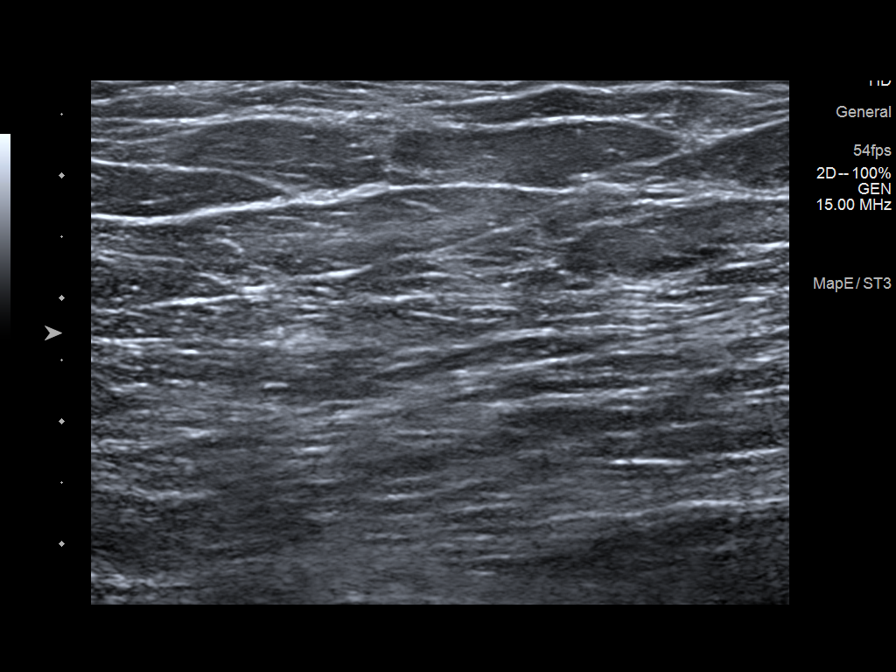
[im 2/8]
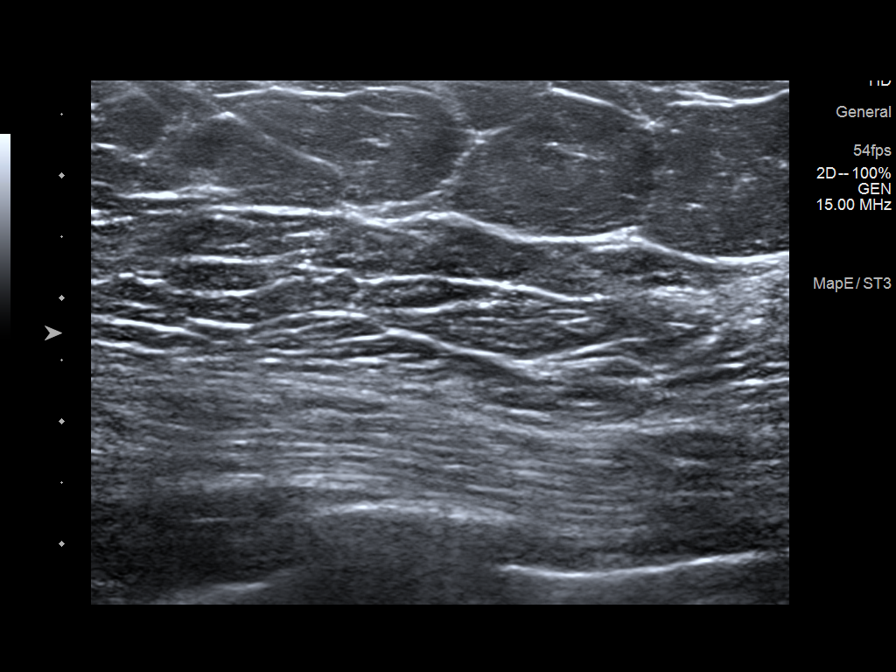
[im 3/8]
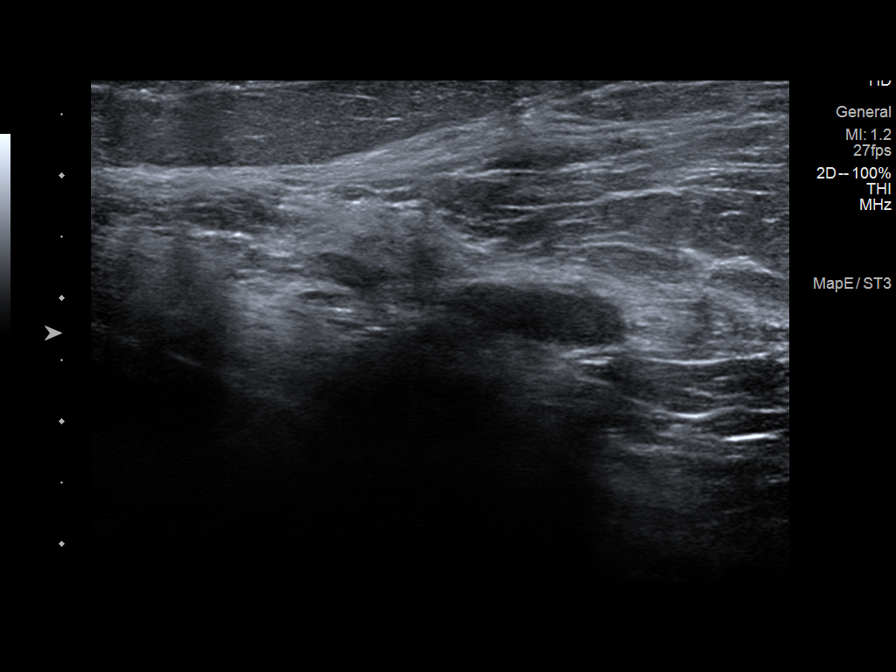
[im 4/8]
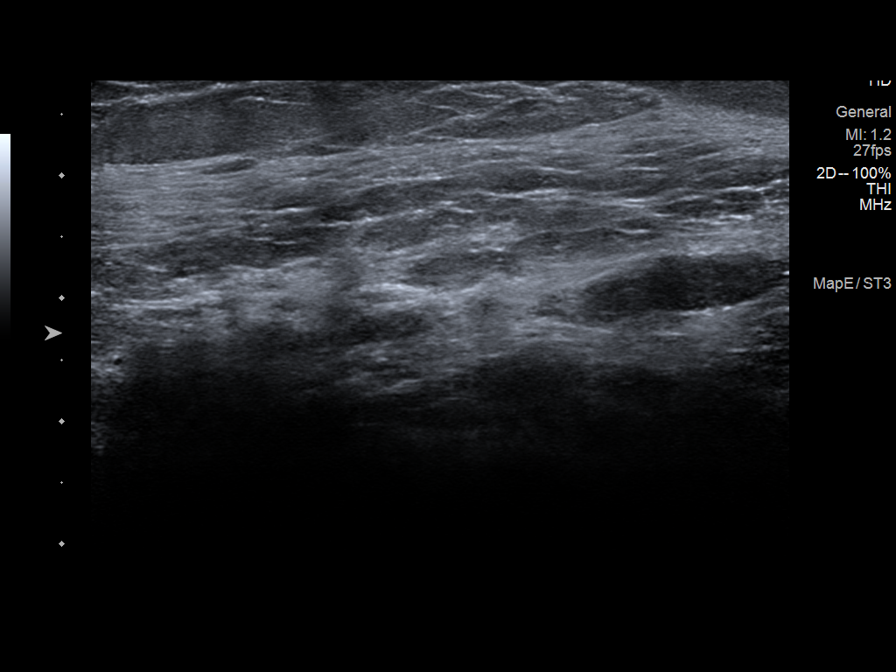
[im 5/8]
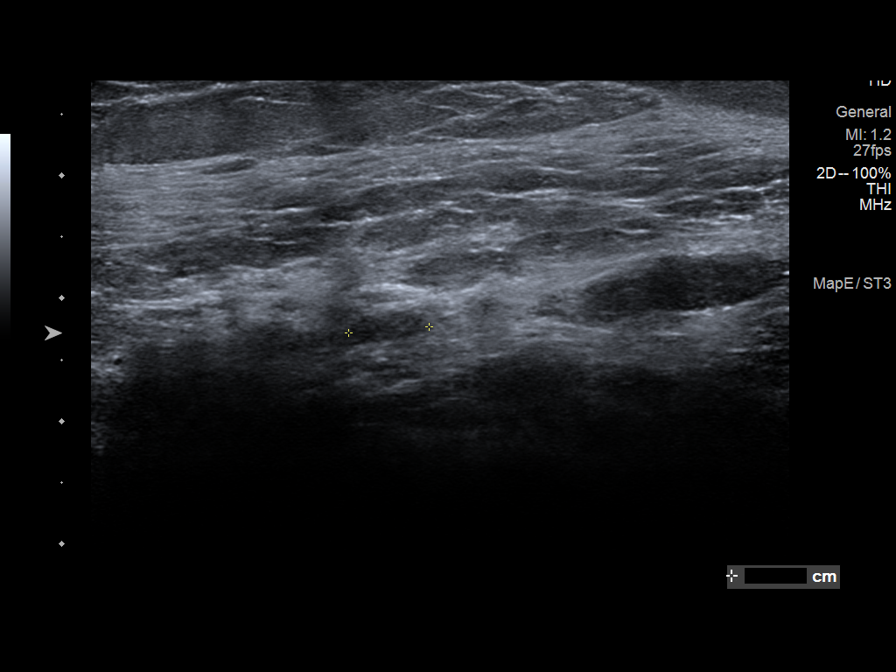
[im 6/8]
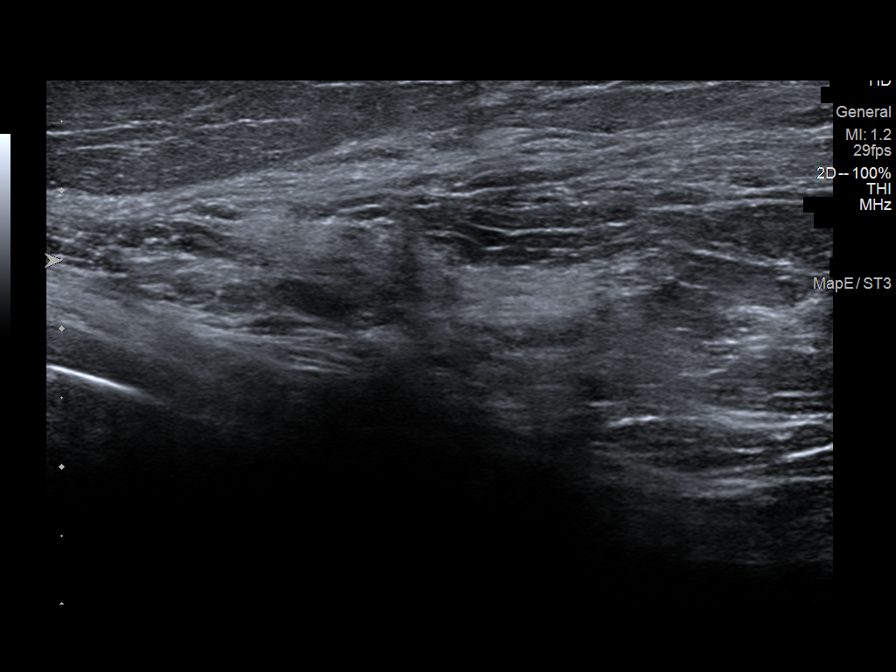
[im 7/8]
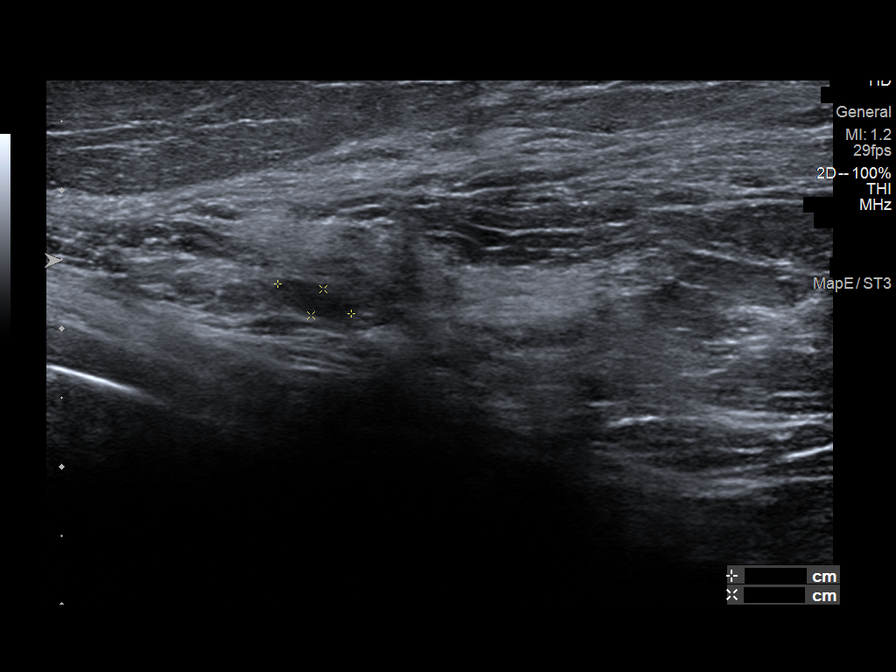
[im 8/8]
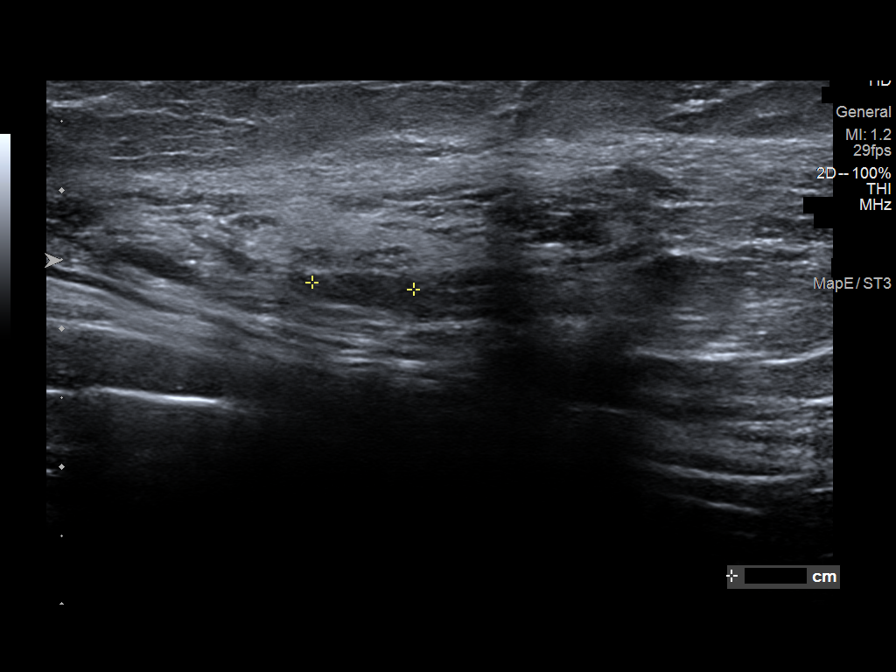

[8 of 8 positions shown; findings below may reference images not displayed]

ACR Breast Density Category b: There are scattered areas of
fibroglandular density.
FINDINGS: Post biopsy changes noted in the upper-outer quadrant of the right
breast at mid and posterior depth. No additional suspicious
mammographic findings are identified in either breast.

Mammographic images were processed with CAD.

Targeted ultrasound is performed, showing interval decrease in size
of an oval, circumscribed hypoechoic mass at the 2 o'clock position
13 cm from the nipple on the left. Today it measures 7 x 6 x 2 mm
(previously 9 x 7 x 3 mm). This is consistent with a benign process
given interval decrease in size. No additional suspicious findings
along the 1 o'clock axis.

Targeted ultrasound is performed, showing previously biopsied mass
at the [DATE] position 9 cm from the nipple. This is consistent with
a benign process. No additional suspicious findings are identified
along the 11 o'clock axis.
IMPRESSION: 1. No suspicious mammographic or sonographic findings in either
breast.
2. Interval decrease in size of a mass at the left breast 2 o'clock
position, consistent with a benign process. No further imaging
follow-up required.

RECOMMENDATION:
Screening mammogram in one year.(Code:80-[id number])

I have discussed the findings and recommendations with the patient.
If applicable, a reminder letter will be sent to the patient
regarding the next appointment.

BI-RADS CATEGORY  2: Benign.

## 2021-05-21 DIAGNOSIS — E559 Vitamin D deficiency, unspecified: Secondary | ICD-10-CM | POA: Diagnosis not present

## 2021-05-21 DIAGNOSIS — K909 Intestinal malabsorption, unspecified: Secondary | ICD-10-CM | POA: Diagnosis not present

## 2021-05-21 DIAGNOSIS — R195 Other fecal abnormalities: Secondary | ICD-10-CM | POA: Diagnosis not present

## 2021-05-21 DIAGNOSIS — Z9884 Bariatric surgery status: Secondary | ICD-10-CM | POA: Diagnosis not present

## 2021-07-02 ENCOUNTER — Other Ambulatory Visit (HOSPITAL_BASED_OUTPATIENT_CLINIC_OR_DEPARTMENT_OTHER): Payer: Self-pay | Admitting: Family Medicine

## 2021-07-02 DIAGNOSIS — H543 Unqualified visual loss, both eyes: Secondary | ICD-10-CM | POA: Diagnosis not present

## 2021-07-02 DIAGNOSIS — R1032 Left lower quadrant pain: Secondary | ICD-10-CM | POA: Diagnosis not present

## 2021-07-02 DIAGNOSIS — Z1211 Encounter for screening for malignant neoplasm of colon: Secondary | ICD-10-CM | POA: Diagnosis not present

## 2021-07-02 DIAGNOSIS — F331 Major depressive disorder, recurrent, moderate: Secondary | ICD-10-CM | POA: Diagnosis not present

## 2021-07-02 DIAGNOSIS — M79675 Pain in left toe(s): Secondary | ICD-10-CM | POA: Diagnosis not present

## 2021-07-02 DIAGNOSIS — F5101 Primary insomnia: Secondary | ICD-10-CM | POA: Diagnosis not present

## 2021-07-02 DIAGNOSIS — G932 Benign intracranial hypertension: Secondary | ICD-10-CM | POA: Diagnosis not present

## 2021-07-03 ENCOUNTER — Other Ambulatory Visit: Payer: Self-pay

## 2021-07-03 ENCOUNTER — Ambulatory Visit (HOSPITAL_BASED_OUTPATIENT_CLINIC_OR_DEPARTMENT_OTHER)
Admission: RE | Admit: 2021-07-03 | Discharge: 2021-07-03 | Disposition: A | Discharge status: Home / Self Care | Payer: Medicare HMO | Source: Ambulatory Visit | Attending: Family Medicine | Admitting: Family Medicine

## 2021-07-03 DIAGNOSIS — R1032 Left lower quadrant pain: Secondary | ICD-10-CM | POA: Diagnosis not present

## 2021-07-03 DIAGNOSIS — R109 Unspecified abdominal pain: Secondary | ICD-10-CM | POA: Diagnosis not present

## 2021-07-03 MED ORDER — IOHEXOL 300 MG/ML  SOLN
100.0000 mL | Freq: Once | INTRAMUSCULAR | Status: AC | PRN
  Administered 2021-07-03: 100 mL via INTRAVENOUS

## 2021-07-19 ENCOUNTER — Ambulatory Visit: Payer: Medicare HMO | Admitting: Podiatry

## 2021-07-23 DIAGNOSIS — R232 Flushing: Secondary | ICD-10-CM | POA: Diagnosis not present

## 2021-07-23 DIAGNOSIS — Z01419 Encounter for gynecological examination (general) (routine) without abnormal findings: Secondary | ICD-10-CM | POA: Diagnosis not present

## 2021-07-23 DIAGNOSIS — R87610 Atypical squamous cells of undetermined significance on cytologic smear of cervix (ASC-US): Secondary | ICD-10-CM | POA: Diagnosis not present

## 2021-07-23 DIAGNOSIS — Z124 Encounter for screening for malignant neoplasm of cervix: Secondary | ICD-10-CM | POA: Diagnosis not present

## 2021-07-31 ENCOUNTER — Ambulatory Visit (INDEPENDENT_AMBULATORY_CARE_PROVIDER_SITE_OTHER): Payer: Medicare HMO

## 2021-07-31 ENCOUNTER — Other Ambulatory Visit: Payer: Self-pay | Admitting: Podiatry

## 2021-07-31 ENCOUNTER — Encounter: Payer: Self-pay | Admitting: Podiatry

## 2021-07-31 ENCOUNTER — Ambulatory Visit: Payer: Medicare HMO | Admitting: Podiatry

## 2021-07-31 DIAGNOSIS — N761 Subacute and chronic vaginitis: Secondary | ICD-10-CM | POA: Insufficient documentation

## 2021-07-31 DIAGNOSIS — M778 Other enthesopathies, not elsewhere classified: Secondary | ICD-10-CM

## 2021-07-31 DIAGNOSIS — J301 Allergic rhinitis due to pollen: Secondary | ICD-10-CM | POA: Insufficient documentation

## 2021-07-31 DIAGNOSIS — K909 Intestinal malabsorption, unspecified: Secondary | ICD-10-CM | POA: Insufficient documentation

## 2021-07-31 DIAGNOSIS — F330 Major depressive disorder, recurrent, mild: Secondary | ICD-10-CM | POA: Insufficient documentation

## 2021-07-31 DIAGNOSIS — K219 Gastro-esophageal reflux disease without esophagitis: Secondary | ICD-10-CM | POA: Insufficient documentation

## 2021-07-31 DIAGNOSIS — S93325A Dislocation of tarsometatarsal joint of left foot, initial encounter: Secondary | ICD-10-CM

## 2021-07-31 DIAGNOSIS — R809 Proteinuria, unspecified: Secondary | ICD-10-CM | POA: Insufficient documentation

## 2021-07-31 DIAGNOSIS — G610 Guillain-Barre syndrome: Secondary | ICD-10-CM | POA: Insufficient documentation

## 2021-07-31 DIAGNOSIS — N921 Excessive and frequent menstruation with irregular cycle: Secondary | ICD-10-CM | POA: Insufficient documentation

## 2021-07-31 DIAGNOSIS — A048 Other specified bacterial intestinal infections: Secondary | ICD-10-CM | POA: Insufficient documentation

## 2021-07-31 DIAGNOSIS — E785 Hyperlipidemia, unspecified: Secondary | ICD-10-CM | POA: Insufficient documentation

## 2021-07-31 DIAGNOSIS — M1990 Unspecified osteoarthritis, unspecified site: Secondary | ICD-10-CM | POA: Insufficient documentation

## 2021-07-31 DIAGNOSIS — Z6831 Body mass index (BMI) 31.0-31.9, adult: Secondary | ICD-10-CM | POA: Insufficient documentation

## 2021-07-31 DIAGNOSIS — K5792 Diverticulitis of intestine, part unspecified, without perforation or abscess without bleeding: Secondary | ICD-10-CM | POA: Insufficient documentation

## 2021-07-31 DIAGNOSIS — F5101 Primary insomnia: Secondary | ICD-10-CM | POA: Insufficient documentation

## 2021-07-31 DIAGNOSIS — F410 Panic disorder [episodic paroxysmal anxiety] without agoraphobia: Secondary | ICD-10-CM | POA: Insufficient documentation

## 2021-07-31 DIAGNOSIS — L80 Vitiligo: Secondary | ICD-10-CM | POA: Insufficient documentation

## 2021-07-31 DIAGNOSIS — M19179 Post-traumatic osteoarthritis, unspecified ankle and foot: Secondary | ICD-10-CM | POA: Diagnosis not present

## 2021-07-31 DIAGNOSIS — R1030 Lower abdominal pain, unspecified: Secondary | ICD-10-CM | POA: Insufficient documentation

## 2021-07-31 DIAGNOSIS — G9332 Myalgic encephalomyelitis/chronic fatigue syndrome: Secondary | ICD-10-CM | POA: Insufficient documentation

## 2021-07-31 NOTE — Progress Notes (Signed)
?Subjective:  ?Patient ID: Mackenzie [name], female    DOB: 17-Dec-1975,  MRN: [id number] ?HPI ?Chief Complaint  ?Patient presents with  ? Foot Pain  ?  1st toe/MPJ left - injury last year with treadmill, been tender since, ROM very painful, PCP rx'd NSAID and rest - no help  ? New Patient (Initial Visit)  ? ? ?46 y.o. female presents with the above complaint.  ? ?ROS: Denies fever chills nausea vomiting muscle aches pains calf pain back pain chest pain shortness of breath. ? ?She states that she injured this a year ago this past June states that initially it was the big toe that was hurting due to the treadmill.  She states that she hurt it on the treadmill and now it hurts to bend up.  She states that a while after that she noticed that she started to develop pain in the midfoot as well.  She states that is affecting her ability perform her daily activities. ? ? ?Past Medical History:  ?Diagnosis Date  ? Anemia   ? history  ? Blind 1994  ? Blood transfusion 04/2010  ? cancer center at [facility] - iron transfusion per pt  ? Breast lump left  ? Depression   ? no meds  ? H/O hiatal hernia   ? repaired with weight loss surgery  ? History of uterine fibroid   ? Neuromuscular disorder ([place])   ? 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  ? Pseudotumor cerebri 1994  ? History - caused blind  ? ?Past Surgical History:  ?Procedure Laterality Date  ? APPENDECTOMY    ? removed with switch surgery  ? BREAST BIOPSY Right 07/09/2019  ? x2  ? BREAST EXCISIONAL BIOPSY Left   ? fibroadenoma  ? BREAST SURGERY  2012  ? right breast lumpectomy  ? CHOLECYSTECTOMY    ? removed with switch surgery  ? DILITATION & CURRETTAGE/HYSTROSCOPY WITH HYDROTHERMAL ABLATION N/A 12/16/2018  ? Procedure: DILATATION & CURETTAGE/HYSTEROSCOPY WITH HYDROTHERMAL ABLATION;  Surgeon: Janyth Pupa, DO;  Location: [place];  Service: Gynecology;  Laterality: N/A;  ? eye decompression x3  1994  ? Bilateral   ? FETAL BLOOD TRANSFUSION  jan  29,2012  ? LIPOMA EXCISION Left 08/24/2019  ? Procedure: EXCISION LIPOMA LEFT UPPER CHEST;  Surgeon: Erroll Luna, MD;  Location: [place];  Service: General;  Laterality: Left;  ? lp shunt  1994  ? removed in 2009  ? MYOMECTOMY    ? rurod switch  2007  ? weight loss surgery done in [place] (duodenal switch)  ? ? ?Current Outpatient Medications:  ?  Multiple Vitamin (MULTIVITAMIN) capsule, Take 1 capsule by mouth daily., Disp: , Rfl:  ?  sertraline (ZOLOFT) 50 MG tablet, Take 50 mg by mouth daily., Disp: , Rfl:  ?  Zolpidem Tartrate (AMBIEN PO), Take by mouth., Disp: , Rfl:  ? ?Allergies  ?Allergen Reactions  ? Influenza Vaccines   ?  Hx Guillain Barre Syndrome  ? Mirabegron   ?  Other reaction(s): Other (See Comments) ?headaches  ? ?Review of Systems ?Objective:  ?There were no vitals filed for this visit. ? ?General: Well developed, nourished, in no acute distress, alert and oriented x3  ? ?Dermatological: Skin is warm, dry and supple bilateral. Nails x 10 are well maintained; remaining integument appears unremarkable at this time. There are no open sores, no preulcerative lesions, no rash or signs of infection present. ? ?Vascular: Dorsalis Pedis artery and Posterior Tibial  artery pedal pulses are 2/4 bilateral with immedate capillary fill time. Pedal hair growth present. No varicosities and no lower extremity edema present bilateral.  ? ?Neruologic: Grossly intact via light touch bilateral. Vibratory intact via tuning fork bilateral. Protective threshold with Semmes Wienstein monofilament intact to all pedal sites bilateral. Patellar and Achilles deep tendon reflexes 2+ bilateral. No Babinski or clonus noted bilateral.  ? ?Musculoskeletal: No gross boney pedal deformities bilateral. No pain, crepitus, or limitation noted with foot and ankle range of motion bilateral. Muscular strength 5/5 in all groups tested bilateral.  She presents pain with attempted range of motion of the first TMT  joint pain on palpation of the first intermetatarsal space proximally and pain on attempted range of motion of the first metatarsophalangeal joint.  Hallux valgus is also noted.  And is reducible. ? ?Gait: Unassisted, Nonantalgic.  ? ? ?Radiographs: ? ?Radiographs taken today demonstrate an osseously mature individual there appears to be a Lisfranc separation of the first metatarsal medial cuneiform.  There also appears to have been damage to the head of the first metatarsal itself.  An increase in the first intermetatarsal angle is noted and is painful on attempted range of motion either at the first TMT joint or the first MTPJ. ? ?Assessment & Plan:  ? ?Assessment: Lisfranc's injury as well as injury to the first metatarsal phalangeal joint x1 year left foot. ? ?Plan: Conservative therapies have failed to alleviate her symptoms.  MRI is will be requested for the first metatarsophalangeal joint as well as the first TMT joint.  I will follow-up with her once that comes back. ? ? ? ? ?Angelicia Lessner T. [place], DPM ?

## 2021-08-12 ENCOUNTER — Ambulatory Visit
Admission: RE | Admit: 2021-08-12 | Discharge: 2021-08-12 | Disposition: A | Discharge status: Home / Self Care | Payer: Medicare HMO | Source: Ambulatory Visit | Attending: Podiatry | Admitting: Podiatry

## 2021-08-12 DIAGNOSIS — S93325A Dislocation of tarsometatarsal joint of left foot, initial encounter: Secondary | ICD-10-CM

## 2021-08-12 DIAGNOSIS — M19179 Post-traumatic osteoarthritis, unspecified ankle and foot: Secondary | ICD-10-CM

## 2021-08-12 DIAGNOSIS — R600 Localized edema: Secondary | ICD-10-CM | POA: Diagnosis not present

## 2021-08-14 DIAGNOSIS — Z3202 Encounter for pregnancy test, result negative: Secondary | ICD-10-CM | POA: Diagnosis not present

## 2021-08-14 DIAGNOSIS — N72 Inflammatory disease of cervix uteri: Secondary | ICD-10-CM | POA: Diagnosis not present

## 2021-08-14 DIAGNOSIS — R87610 Atypical squamous cells of undetermined significance on cytologic smear of cervix (ASC-US): Secondary | ICD-10-CM | POA: Diagnosis not present

## 2021-08-23 ENCOUNTER — Telehealth: Payer: Self-pay | Admitting: *Deleted

## 2021-08-23 NOTE — Telephone Encounter (Signed)
08/16/21- ? ?Faxed request to [facility] to send patient's MRI disc to [facility]. ? ?Faxed order to [facility] for what to review for 2nd opinion.   ?

## 2021-08-23 NOTE — Telephone Encounter (Signed)
-----   Message from Garrel Ridgel, [place] sent at 08/15/2021  5:17 PM EDT ----- ?Please send this out for an over read and then explained to the patient that this will take extra time.  I would just rather have another opinion. ?----- Message ----- ?From: Rip Harbour, PMAC ?Sent: 08/15/2021   4:48 PM EDT ?To: Max Villa Herb, DPM ? ? ?----- Message ----- ?From: Garrel Ridgel, DPM ?Sent: 08/14/2021  12:19 PM EDT ?To: Rip Harbour, PMAC ? ?She will need compression hose 20-30 mmHg to thigh bilaterally ? ? ?

## 2021-08-24 DIAGNOSIS — Z1211 Encounter for screening for malignant neoplasm of colon: Secondary | ICD-10-CM | POA: Diagnosis not present

## 2021-08-24 DIAGNOSIS — K648 Other hemorrhoids: Secondary | ICD-10-CM | POA: Diagnosis not present

## 2021-08-24 DIAGNOSIS — K644 Residual hemorrhoidal skin tags: Secondary | ICD-10-CM | POA: Diagnosis not present

## 2021-08-24 DIAGNOSIS — Q438 Other specified congenital malformations of intestine: Secondary | ICD-10-CM | POA: Diagnosis not present

## 2021-08-31 ENCOUNTER — Encounter: Payer: Self-pay | Admitting: Podiatry

## 2021-09-04 ENCOUNTER — Telehealth: Payer: Self-pay | Admitting: Podiatry

## 2021-09-04 NOTE — Telephone Encounter (Signed)
Lvm for pt to call to schedule an appt to see Dr Milinda Pointer to discuss mri results. ?

## 2021-09-13 ENCOUNTER — Ambulatory Visit: Payer: Medicare HMO | Admitting: Podiatry

## 2021-09-13 ENCOUNTER — Encounter: Payer: Self-pay | Admitting: Podiatry

## 2021-09-13 DIAGNOSIS — M19179 Post-traumatic osteoarthritis, unspecified ankle and foot: Secondary | ICD-10-CM

## 2021-09-13 DIAGNOSIS — M778 Other enthesopathies, not elsewhere classified: Secondary | ICD-10-CM

## 2021-09-13 MED ORDER — DEXAMETHASONE SODIUM PHOSPHATE 120 MG/30ML IJ SOLN: 2.0000 mg | Freq: Once | INTRAMUSCULAR | Status: AC

## 2021-09-13 MED ORDER — METHYLPREDNISOLONE 4 MG PO TBPK: ORAL_TABLET | ORAL | 0 refills | Status: DC

## 2021-09-13 NOTE — Progress Notes (Signed)
She presents today for follow-up of her MRI she states that her foot is really not any better she refers to the first metatarsal phalangeal joint left foot.  Objective: Vital signs stable alert oriented x3 still has pain on palpation of the bunion deformity and of the joint.  We went over the MRI findings today consistent with osteoarthritis.  Assessment: Osteoarthritis first metatarsophalangeal joint left foot.  Plan: Discussed etiology pathology conservative therapies at this point I went ahead and injected the first metatarsophalangeal joint with dexamethasone local anesthetic after sterile Betadine skin prep and started her on methylprednisolone.  Follow-up with her in 1 month

## 2021-10-05 DIAGNOSIS — R109 Unspecified abdominal pain: Secondary | ICD-10-CM | POA: Diagnosis not present

## 2021-10-08 ENCOUNTER — Other Ambulatory Visit: Payer: Self-pay | Admitting: Family Medicine

## 2021-10-08 DIAGNOSIS — F32A Depression, unspecified: Secondary | ICD-10-CM | POA: Diagnosis not present

## 2021-10-08 DIAGNOSIS — R1032 Left lower quadrant pain: Secondary | ICD-10-CM

## 2021-10-08 DIAGNOSIS — M545 Low back pain, unspecified: Secondary | ICD-10-CM | POA: Diagnosis not present

## 2021-10-08 DIAGNOSIS — F419 Anxiety disorder, unspecified: Secondary | ICD-10-CM | POA: Diagnosis not present

## 2021-10-08 DIAGNOSIS — F5101 Primary insomnia: Secondary | ICD-10-CM | POA: Diagnosis not present

## 2021-10-08 DIAGNOSIS — R030 Elevated blood-pressure reading, without diagnosis of hypertension: Secondary | ICD-10-CM | POA: Diagnosis not present

## 2021-10-09 ENCOUNTER — Ambulatory Visit
Admission: RE | Admit: 2021-10-09 | Discharge: 2021-10-09 | Disposition: A | Discharge status: Home / Self Care | Payer: Medicare HMO | Source: Ambulatory Visit | Attending: Family Medicine | Admitting: Family Medicine

## 2021-10-09 DIAGNOSIS — D259 Leiomyoma of uterus, unspecified: Secondary | ICD-10-CM | POA: Diagnosis not present

## 2021-10-09 DIAGNOSIS — R1032 Left lower quadrant pain: Secondary | ICD-10-CM

## 2021-10-11 DIAGNOSIS — N644 Mastodynia: Secondary | ICD-10-CM | POA: Diagnosis not present

## 2021-10-11 DIAGNOSIS — R922 Inconclusive mammogram: Secondary | ICD-10-CM | POA: Diagnosis not present

## 2021-10-18 DIAGNOSIS — J069 Acute upper respiratory infection, unspecified: Secondary | ICD-10-CM | POA: Diagnosis not present

## 2021-10-18 DIAGNOSIS — Z03818 Encounter for observation for suspected exposure to other biological agents ruled out: Secondary | ICD-10-CM | POA: Diagnosis not present

## 2021-11-22 DIAGNOSIS — N3281 Overactive bladder: Secondary | ICD-10-CM | POA: Diagnosis not present

## 2021-11-22 DIAGNOSIS — N3289 Other specified disorders of bladder: Secondary | ICD-10-CM | POA: Diagnosis not present

## 2021-12-24 ENCOUNTER — Other Ambulatory Visit: Payer: Medicare HMO

## 2021-12-24 ENCOUNTER — Other Ambulatory Visit: Payer: Self-pay

## 2021-12-24 DIAGNOSIS — Z23 Encounter for immunization: Secondary | ICD-10-CM | POA: Diagnosis not present

## 2021-12-24 DIAGNOSIS — J3481 Nasal mucositis (ulcerative): Secondary | ICD-10-CM | POA: Diagnosis not present

## 2021-12-24 DIAGNOSIS — Z136 Encounter for screening for cardiovascular disorders: Secondary | ICD-10-CM | POA: Diagnosis not present

## 2021-12-24 DIAGNOSIS — H543 Unqualified visual loss, both eyes: Secondary | ICD-10-CM | POA: Diagnosis not present

## 2021-12-24 DIAGNOSIS — D509 Iron deficiency anemia, unspecified: Secondary | ICD-10-CM

## 2021-12-24 DIAGNOSIS — Z13220 Encounter for screening for lipoid disorders: Secondary | ICD-10-CM | POA: Diagnosis not present

## 2021-12-24 DIAGNOSIS — F439 Reaction to severe stress, unspecified: Secondary | ICD-10-CM | POA: Diagnosis not present

## 2021-12-24 DIAGNOSIS — E559 Vitamin D deficiency, unspecified: Secondary | ICD-10-CM | POA: Diagnosis not present

## 2021-12-24 DIAGNOSIS — Z0000 Encounter for general adult medical examination without abnormal findings: Secondary | ICD-10-CM | POA: Diagnosis not present

## 2021-12-25 ENCOUNTER — Inpatient Hospital Stay: Payer: Medicare HMO | Attending: Hematology

## 2021-12-25 ENCOUNTER — Other Ambulatory Visit: Payer: Self-pay

## 2021-12-25 ENCOUNTER — Inpatient Hospital Stay: Payer: Medicare HMO

## 2021-12-25 DIAGNOSIS — Z7952 Long term (current) use of systemic steroids: Secondary | ICD-10-CM | POA: Insufficient documentation

## 2021-12-25 DIAGNOSIS — D508 Other iron deficiency anemias: Secondary | ICD-10-CM | POA: Insufficient documentation

## 2021-12-25 DIAGNOSIS — Z86018 Personal history of other benign neoplasm: Secondary | ICD-10-CM | POA: Diagnosis not present

## 2021-12-25 DIAGNOSIS — Z887 Allergy status to serum and vaccine status: Secondary | ICD-10-CM | POA: Diagnosis not present

## 2021-12-25 DIAGNOSIS — N920 Excessive and frequent menstruation with regular cycle: Secondary | ICD-10-CM | POA: Insufficient documentation

## 2021-12-25 DIAGNOSIS — E569 Vitamin deficiency, unspecified: Secondary | ICD-10-CM | POA: Diagnosis not present

## 2021-12-25 DIAGNOSIS — Z79899 Other long term (current) drug therapy: Secondary | ICD-10-CM | POA: Diagnosis not present

## 2021-12-25 DIAGNOSIS — Z9049 Acquired absence of other specified parts of digestive tract: Secondary | ICD-10-CM | POA: Insufficient documentation

## 2021-12-25 DIAGNOSIS — E559 Vitamin D deficiency, unspecified: Secondary | ICD-10-CM

## 2021-12-25 DIAGNOSIS — D509 Iron deficiency anemia, unspecified: Secondary | ICD-10-CM

## 2021-12-25 LAB — CBC WITH DIFFERENTIAL (CANCER CENTER ONLY)
Abs Immature Granulocytes: 0.02 10*3/uL (ref 0.00–0.07)
Basophils Absolute: 0 10*3/uL (ref 0.0–0.1)
Basophils Relative: 0 %
Eosinophils Absolute: 0 10*3/uL (ref 0.0–0.5)
Eosinophils Relative: 0 %
HCT: 37.1 % (ref 36.0–46.0)
Hemoglobin: 12.1 g/dL (ref 12.0–15.0)
Immature Granulocytes: 0 %
Lymphocytes Relative: 16 %
Lymphs Abs: 1.7 10*3/uL (ref 0.7–4.0)
MCH: 31.5 pg (ref 26.0–34.0)
MCHC: 32.6 g/dL (ref 30.0–36.0)
MCV: 96.6 fL (ref 80.0–100.0)
Monocytes Absolute: 0.7 10*3/uL (ref 0.1–1.0)
Monocytes Relative: 6 %
Neutro Abs: 8.3 10*3/uL — ABNORMAL HIGH (ref 1.7–7.7)
Neutrophils Relative %: 78 %
Platelet Count: 202 10*3/uL (ref 150–400)
RBC: 3.84 MIL/uL — ABNORMAL LOW (ref 3.87–5.11)
RDW: 13.3 % (ref 11.5–15.5)
WBC Count: 10.7 10*3/uL — ABNORMAL HIGH (ref 4.0–10.5)
nRBC: 0 % (ref 0.0–0.2)

## 2021-12-25 LAB — COMPREHENSIVE METABOLIC PANEL
ALT: 28 U/L (ref 0–44)
AST: 24 U/L (ref 15–41)
Albumin: 4.7 g/dL (ref 3.5–5.0)
Alkaline Phosphatase: 49 U/L (ref 38–126)
Anion gap: 3 — ABNORMAL LOW (ref 5–15)
BUN: 14 mg/dL (ref 6–20)
CO2: 28 mmol/L (ref 22–32)
Calcium: 9 mg/dL (ref 8.9–10.3)
Chloride: 106 mmol/L (ref 98–111)
Creatinine, Ser: 0.56 mg/dL (ref 0.44–1.00)
GFR, Estimated: >60 mL/min (ref >60)
Glucose, Bld: 91 mg/dL (ref 70–99)
Potassium: 3.8 mmol/L (ref 3.5–5.1)
Sodium: 137 mmol/L (ref 135–145)
Total Bilirubin: 0.6 mg/dL (ref 0.3–1.2)
Total Protein: 8 g/dL (ref 6.5–8.1)

## 2021-12-25 LAB — IRON AND IRON BINDING CAPACITY (CC-WL,HP ONLY)
Iron: 94 ug/dL (ref 28–170)
Saturation Ratios: 23 % (ref 10.4–31.8)
TIBC: 409 ug/dL (ref 250–450)
UIBC: 315 ug/dL (ref 148–442)

## 2021-12-25 LAB — FERRITIN: Ferritin: 112 ng/mL (ref 11–307)

## 2021-12-27 ENCOUNTER — Encounter: Payer: Self-pay | Admitting: Hematology

## 2021-12-27 ENCOUNTER — Inpatient Hospital Stay (HOSPITAL_BASED_OUTPATIENT_CLINIC_OR_DEPARTMENT_OTHER): Payer: Medicare HMO | Admitting: Hematology

## 2021-12-27 DIAGNOSIS — E610 Copper deficiency: Secondary | ICD-10-CM | POA: Diagnosis not present

## 2021-12-27 DIAGNOSIS — E509 Vitamin A deficiency, unspecified: Secondary | ICD-10-CM | POA: Diagnosis not present

## 2021-12-27 DIAGNOSIS — E60 Dietary zinc deficiency: Secondary | ICD-10-CM

## 2021-12-27 DIAGNOSIS — E559 Vitamin D deficiency, unspecified: Secondary | ICD-10-CM | POA: Diagnosis not present

## 2021-12-27 LAB — VITAMIN D 25 HYDROXY (VIT D DEFICIENCY, FRACTURES): Vit D, 25-Hydroxy: 21.76 ng/mL — ABNORMAL LOW (ref 30–100)

## 2021-12-27 NOTE — Progress Notes (Signed)
[place]   Telephone:([phone] Fax:(336) [phone]   Clinic Follow up Note   Patient Care Team: Lujean Amel, MD as PCP - General (Family Medicine)  Date of Service:  12/27/2021  I connected with Mackenzie [name] on 12/27/2021 at  1:00 PM EDT by telephone visit and verified that I am speaking with the correct person using two identifiers.  I discussed the limitations, risks, security and privacy concerns of performing an evaluation and management service by telephone and the availability of in person appointments. I also discussed with the patient that there may be a patient responsible charge related to this service. The patient expressed understanding and agreed to proceed.   Other persons participating in the visit and their role in the encounter:  none  Patient's location:  home Provider's location:  my office  CHIEF COMPLAINT: f/u of anemia, vitamin deficiencies  CURRENT THERAPY:  Surveillance  ASSESSMENT & PLAN:  Mackenzie [name] is a 46 y.o. female with   1. Nutritional anemia -She was diagnosed with iron deficient anemia secondary to her duodenal switch procedure, and heavy menstrual period. She responded well to IV iron. -She now has macrocytic anemia, with only low copper and zinc levels. -She underwent D&C ablation on 12/16/18. She has not had period since.  -She is taking oral multivitamin and iron, and multivitamin infusion. She received IV Feraheme as needed, last 04/27/20. -labs from 12/25/21 showed: CMP is WNL; CBC with WBC 10.7/ANC 8.3, RBC 2.84; ferritin 112; iron serum 94. I reviewed with her today. I recommend she continue to follow up with her endocrinologist. -I will see her as needed    2. Other vitamin deficiency  -she previously received multivitamin infusions. -she continues on oral supplements.      Plan -I will will add on vitamin D level in lab and the let her know the results  -She will follow-up with her endocrinologist  for her vitamin deficiencies, and see me as needed in future.   No problem-specific Assessment & Plan notes found for this encounter.   INTERVAL HISTORY:  Mackenzie [name] was contacted for a follow up of anemia. She was last seen by me on 03/29/21. She reports she is doing well overall, no new complaints.    All other systems were reviewed with the patient and are negative.  MEDICAL HISTORY:  Past Medical History:  Diagnosis Date   Anemia    history   Blind 1994   Blood transfusion 04/2010   cancer center at [facility] - iron transfusion per pt   Breast lump left   Depression    no meds   H/O hiatal hernia    repaired with weight loss surgery   History of uterine fibroid    Neuromuscular disorder ([place])    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   Pseudotumor cerebri 1994   History - caused blind    SURGICAL HISTORY: Past Surgical History:  Procedure Laterality Date   APPENDECTOMY     removed with switch surgery   BREAST BIOPSY Right 07/09/2019   x2   BREAST EXCISIONAL BIOPSY Left    fibroadenoma   BREAST SURGERY  2012   right breast lumpectomy   CHOLECYSTECTOMY     removed with switch surgery   DILITATION & CURRETTAGE/HYSTROSCOPY WITH HYDROTHERMAL ABLATION N/A 12/16/2018   Procedure: DILATATION & CURETTAGE/HYSTEROSCOPY WITH HYDROTHERMAL ABLATION;  Surgeon: Janyth Pupa, DO;  Location: [place];  Service: Gynecology;  Laterality: N/A;  eye decompression x3  1994   Bilateral    FETAL BLOOD TRANSFUSION  jan 29,2012   LIPOMA EXCISION Left 08/24/2019   Procedure: EXCISION LIPOMA LEFT UPPER CHEST;  Surgeon: Erroll Luna, MD;  Location: [place];  Service: General;  Laterality: Left;   lp shunt  1994   removed in 2009   MYOMECTOMY     rurod switch  2007   weight loss surgery done in [place] (duodenal switch)    I have reviewed the social history and family history with the patient and they are unchanged from previous  note.  ALLERGIES:  is allergic to influenza vaccines and mirabegron.  MEDICATIONS:  Current Outpatient Medications  Medication Sig Dispense Refill   methylPREDNISolone (MEDROL DOSEPAK) 4 MG TBPK tablet 6 day dose pack - take as directed 21 tablet 0   Multiple Vitamin (MULTIVITAMIN) capsule Take 1 capsule by mouth daily.     sertraline (ZOLOFT) 50 MG tablet Take 50 mg by mouth daily.     Zolpidem Tartrate (AMBIEN PO) Take by mouth.     Current Facility-Administered Medications  Medication Dose Route Frequency Provider Last Rate Last Admin   dexamethasone (DECADRON) injection 2 mg  2 mg Intra-articular Once Hyatt, Max T, DPM        PHYSICAL EXAMINATION: ECOG PERFORMANCE STATUS: 1 - Symptomatic but completely ambulatory  There were no vitals filed for this visit. Wt Readings from Last 3 Encounters:  03/29/21 215 lb 14.4 oz (97.9 kg)  04/27/20 211 lb 8 oz (95.9 kg)  04/10/20 210 lb 3.2 oz (95.3 kg)     No vitals taken today, Exam not performed today  LABORATORY DATA:  I have reviewed the data as listed    Latest Ref Rng & Units 12/25/2021   11:01 AM 03/29/2021   10:34 AM 04/03/2020    8:25 AM  CBC  WBC 4.0 - 10.5 K/uL 10.7  10.9  17.2   Hemoglobin 12.0 - 15.0 g/dL 12.1  12.5  12.6   Hematocrit 36.0 - 46.0 % 37.1  37.6  38.9   Platelets 150 - 400 K/uL 202  211  190         Latest Ref Rng & Units 12/25/2021   11:01 AM 03/29/2021   10:34 AM 05/04/2019    1:47 AM  CMP  Glucose 70 - 99 mg/dL 91  83  130   BUN 6 - 20 mg/dL '14  15  21   '$ Creatinine 0.44 - 1.00 mg/dL 0.56  0.75  0.77   Sodium 135 - 145 mmol/L 137  139  135   Potassium 3.5 - 5.1 mmol/L 3.8  4.1  3.7   Chloride 98 - 111 mmol/L 106  108  110   CO2 22 - 32 mmol/L '28  24  17   '$ Calcium 8.9 - 10.3 mg/dL 9.0  8.4  8.2   Total Protein 6.5 - 8.1 g/dL 8.0  7.3  7.8   Total Bilirubin 0.3 - 1.2 mg/dL 0.6  0.4  0.9   Alkaline Phos 38 - 126 U/L 49  55  53   AST 15 - 41 U/L '24  21  27   '$ ALT 0 - 44 U/L '28  19  22        '$ RADIOGRAPHIC STUDIES: I have personally reviewed the radiological images as listed and agreed with the findings in the report. No results found.    Orders Placed This Encounter  Procedures   Vitamin D 25  hydroxy   Vitamin C    Standing Status:   Future    Standing Expiration Date:   12/27/2022   Vitamin A    Standing Status:   Future    Standing Expiration Date:   12/27/2022   All questions were answered. The patient knows to call the clinic with any problems, questions or concerns. No barriers to learning was detected. The total time spent in the appointment was 15 minutes.     Truitt Merle, MD 12/27/2021   I, Wilburn Mylar, am acting as scribe for Truitt Merle, MD.   I have reviewed the above documentation for accuracy and completeness, and I agree with the above.

## 2022-01-09 ENCOUNTER — Telehealth: Payer: Self-pay

## 2022-01-09 NOTE — Telephone Encounter (Signed)
-----   Message from Truitt Merle, MD sent at 12/29/2021 11:12 PM EDT ----- Please let her know vitd result, she will f/u with her endocrinologist, thx   Truitt Merle  12/29/2021

## 2022-01-09 NOTE — Telephone Encounter (Signed)
This nurse reached out to patient and left a message for patient related to lab results and provider recommendations.  Patient knows to call the clinic if there are any other questions or concerns.

## 2022-01-11 ENCOUNTER — Telehealth: Payer: Self-pay

## 2022-01-11 NOTE — Telephone Encounter (Addendum)
Called and left HIPPA compliant VM advising of Dr. Ernestina Penna message below. Encouraged pt to call clinic with questions regarding this VM message.  ----- Message from Evalee Jefferson, RN sent at 01/11/2022 11:53 AM EDT -----  ----- Message ----- From: Truitt Merle, MD Sent: 12/29/2021  11:12 PM EDT To: Estella Husk, LPN; Evalee Jefferson, RN  Please let her know vitd result, she will f/u with her endocrinologist, thx   Truitt Merle  12/29/2021

## 2022-02-11 DIAGNOSIS — E560 Deficiency of vitamin E: Secondary | ICD-10-CM | POA: Diagnosis not present

## 2022-02-11 DIAGNOSIS — Z9884 Bariatric surgery status: Secondary | ICD-10-CM | POA: Diagnosis not present

## 2022-02-11 DIAGNOSIS — K909 Intestinal malabsorption, unspecified: Secondary | ICD-10-CM | POA: Diagnosis not present

## 2022-02-11 DIAGNOSIS — E559 Vitamin D deficiency, unspecified: Secondary | ICD-10-CM | POA: Diagnosis not present

## 2022-02-11 DIAGNOSIS — L680 Hirsutism: Secondary | ICD-10-CM | POA: Diagnosis not present

## 2022-02-21 DIAGNOSIS — J039 Acute tonsillitis, unspecified: Secondary | ICD-10-CM | POA: Diagnosis not present

## 2022-02-21 DIAGNOSIS — R070 Pain in throat: Secondary | ICD-10-CM | POA: Diagnosis not present

## 2022-03-05 ENCOUNTER — Encounter: Payer: Self-pay | Admitting: Podiatry

## 2022-03-05 ENCOUNTER — Ambulatory Visit: Payer: Medicare HMO | Admitting: Podiatry

## 2022-03-05 DIAGNOSIS — M7752 Other enthesopathy of left foot: Secondary | ICD-10-CM | POA: Diagnosis not present

## 2022-03-05 DIAGNOSIS — M778 Other enthesopathies, not elsewhere classified: Secondary | ICD-10-CM | POA: Diagnosis not present

## 2022-03-05 DIAGNOSIS — M19179 Post-traumatic osteoarthritis, unspecified ankle and foot: Secondary | ICD-10-CM

## 2022-03-05 MED ORDER — TRIAMCINOLONE ACETONIDE 40 MG/ML IJ SUSP
20.0000 mg | Freq: Once | INTRAMUSCULAR | Status: AC
  Administered 2022-03-05: 20 mg

## 2022-03-06 NOTE — Progress Notes (Signed)
She presents today with her husband states that that she still having considerable pain in her left foot.  States the majority of the pain is around the anterior ankle.  States that the great toe feels better than it did.  Objective: Vital signs stable alert oriented x3 no pain on range of motion of the first metatarsophalangeal joint though she does have mild to moderate hallux valgus deformity.  She has tenderness on palpation of the anterior medial ankle without crepitation.  Full range of motion is noted.  Assessment: Capsulitis of the anterior ankle joint left.  Plan: I injected the area today with 10 mg Kenalog 5 mg Marcaine point maximal tenderness.  We will follow-up with her in a few months if necessary.

## 2022-06-04 DIAGNOSIS — R21 Rash and other nonspecific skin eruption: Secondary | ICD-10-CM | POA: Diagnosis not present

## 2022-07-16 DIAGNOSIS — R399 Unspecified symptoms and signs involving the genitourinary system: Secondary | ICD-10-CM | POA: Diagnosis not present

## 2022-07-16 DIAGNOSIS — N898 Other specified noninflammatory disorders of vagina: Secondary | ICD-10-CM | POA: Diagnosis not present

## 2022-07-16 DIAGNOSIS — E663 Overweight: Secondary | ICD-10-CM | POA: Diagnosis not present

## 2022-07-29 DIAGNOSIS — Z9889 Other specified postprocedural states: Secondary | ICD-10-CM | POA: Diagnosis not present

## 2022-07-29 DIAGNOSIS — Z113 Encounter for screening for infections with a predominantly sexual mode of transmission: Secondary | ICD-10-CM | POA: Diagnosis not present

## 2022-07-29 DIAGNOSIS — B977 Papillomavirus as the cause of diseases classified elsewhere: Secondary | ICD-10-CM | POA: Diagnosis not present

## 2022-07-29 DIAGNOSIS — Z124 Encounter for screening for malignant neoplasm of cervix: Secondary | ICD-10-CM | POA: Diagnosis not present

## 2022-07-29 DIAGNOSIS — R232 Flushing: Secondary | ICD-10-CM | POA: Diagnosis not present

## 2022-07-29 DIAGNOSIS — N898 Other specified noninflammatory disorders of vagina: Secondary | ICD-10-CM | POA: Diagnosis not present

## 2022-07-29 DIAGNOSIS — R87612 Low grade squamous intraepithelial lesion on cytologic smear of cervix (LGSIL): Secondary | ICD-10-CM | POA: Diagnosis not present

## 2022-07-29 DIAGNOSIS — Z01419 Encounter for gynecological examination (general) (routine) without abnormal findings: Secondary | ICD-10-CM | POA: Diagnosis not present

## 2022-07-29 DIAGNOSIS — N951 Menopausal and female climacteric states: Secondary | ICD-10-CM | POA: Diagnosis not present

## 2022-07-29 DIAGNOSIS — N912 Amenorrhea, unspecified: Secondary | ICD-10-CM | POA: Diagnosis not present

## 2022-07-29 DIAGNOSIS — Z7251 High risk heterosexual behavior: Secondary | ICD-10-CM | POA: Diagnosis not present

## 2022-08-05 ENCOUNTER — Ambulatory Visit: Payer: Medicare HMO | Admitting: Podiatry

## 2022-08-07 DIAGNOSIS — M25532 Pain in left wrist: Secondary | ICD-10-CM | POA: Diagnosis not present

## 2022-08-12 DIAGNOSIS — R768 Other specified abnormal immunological findings in serum: Secondary | ICD-10-CM | POA: Diagnosis not present

## 2022-09-03 DIAGNOSIS — Z3202 Encounter for pregnancy test, result negative: Secondary | ICD-10-CM | POA: Diagnosis not present

## 2022-09-03 DIAGNOSIS — R232 Flushing: Secondary | ICD-10-CM | POA: Diagnosis not present

## 2022-09-03 DIAGNOSIS — N951 Menopausal and female climacteric states: Secondary | ICD-10-CM | POA: Diagnosis not present

## 2022-09-03 DIAGNOSIS — B977 Papillomavirus as the cause of diseases classified elsewhere: Secondary | ICD-10-CM | POA: Diagnosis not present

## 2022-09-03 DIAGNOSIS — R87612 Low grade squamous intraepithelial lesion on cytologic smear of cervix (LGSIL): Secondary | ICD-10-CM | POA: Diagnosis not present

## 2022-09-03 DIAGNOSIS — Z79890 Hormone replacement therapy (postmenopausal): Secondary | ICD-10-CM | POA: Diagnosis not present

## 2022-10-29 DIAGNOSIS — Z1231 Encounter for screening mammogram for malignant neoplasm of breast: Secondary | ICD-10-CM | POA: Diagnosis not present

## 2022-11-04 DIAGNOSIS — F330 Major depressive disorder, recurrent, mild: Secondary | ICD-10-CM | POA: Diagnosis not present

## 2022-11-11 DIAGNOSIS — H1089 Other conjunctivitis: Secondary | ICD-10-CM | POA: Diagnosis not present

## 2022-11-18 DIAGNOSIS — F330 Major depressive disorder, recurrent, mild: Secondary | ICD-10-CM | POA: Diagnosis not present

## 2022-11-25 DIAGNOSIS — F330 Major depressive disorder, recurrent, mild: Secondary | ICD-10-CM | POA: Diagnosis not present

## 2022-12-13 DIAGNOSIS — F331 Major depressive disorder, recurrent, moderate: Secondary | ICD-10-CM | POA: Diagnosis not present

## 2022-12-20 DIAGNOSIS — F331 Major depressive disorder, recurrent, moderate: Secondary | ICD-10-CM | POA: Diagnosis not present

## 2022-12-26 ENCOUNTER — Ambulatory Visit: Payer: Medicare HMO | Admitting: Podiatry

## 2022-12-26 DIAGNOSIS — M7752 Other enthesopathy of left foot: Secondary | ICD-10-CM

## 2022-12-27 DIAGNOSIS — F419 Anxiety disorder, unspecified: Secondary | ICD-10-CM | POA: Diagnosis not present

## 2022-12-27 DIAGNOSIS — F5101 Primary insomnia: Secondary | ICD-10-CM | POA: Diagnosis not present

## 2022-12-27 DIAGNOSIS — E559 Vitamin D deficiency, unspecified: Secondary | ICD-10-CM | POA: Diagnosis not present

## 2022-12-27 DIAGNOSIS — F331 Major depressive disorder, recurrent, moderate: Secondary | ICD-10-CM | POA: Diagnosis not present

## 2022-12-27 DIAGNOSIS — K909 Intestinal malabsorption, unspecified: Secondary | ICD-10-CM | POA: Diagnosis not present

## 2022-12-27 DIAGNOSIS — E509 Vitamin A deficiency, unspecified: Secondary | ICD-10-CM | POA: Diagnosis not present

## 2022-12-27 DIAGNOSIS — Z0000 Encounter for general adult medical examination without abnormal findings: Secondary | ICD-10-CM | POA: Diagnosis not present

## 2022-12-27 DIAGNOSIS — E785 Hyperlipidemia, unspecified: Secondary | ICD-10-CM | POA: Diagnosis not present

## 2022-12-27 DIAGNOSIS — H543 Unqualified visual loss, both eyes: Secondary | ICD-10-CM | POA: Diagnosis not present

## 2022-12-27 DIAGNOSIS — R252 Cramp and spasm: Secondary | ICD-10-CM | POA: Diagnosis not present

## 2023-01-01 DIAGNOSIS — F331 Major depressive disorder, recurrent, moderate: Secondary | ICD-10-CM | POA: Diagnosis not present

## 2023-01-07 ENCOUNTER — Ambulatory Visit (INDEPENDENT_AMBULATORY_CARE_PROVIDER_SITE_OTHER): Payer: Medicare HMO | Admitting: Podiatry

## 2023-01-07 ENCOUNTER — Encounter: Payer: Self-pay | Admitting: Podiatry

## 2023-01-07 DIAGNOSIS — M7752 Other enthesopathy of left foot: Secondary | ICD-10-CM | POA: Diagnosis not present

## 2023-01-07 DIAGNOSIS — M25572 Pain in left ankle and joints of left foot: Secondary | ICD-10-CM | POA: Diagnosis not present

## 2023-01-07 MED ORDER — TRIAMCINOLONE ACETONIDE 40 MG/ML IJ SUSP
40.0000 mg | Freq: Once | INTRAMUSCULAR | Status: AC
Start: 2023-01-07 — End: 2023-01-07
  Administered 2023-01-07: 40 mg

## 2023-01-08 DIAGNOSIS — F331 Major depressive disorder, recurrent, moderate: Secondary | ICD-10-CM | POA: Diagnosis not present

## 2023-01-08 NOTE — Progress Notes (Signed)
She presents today for follow-up of her capsulitis left ankle she states that has been really bad for the past 2 months can hardly bear weight on it.  Objective: Vital signs are stable she is alert and oriented x 3 has severe pain on inversion eversion of the subtalar joint left she also has pain on palpation anterior medial aspect of her talar dome.  Assessment: Capsulitis ankle subtalar joint.  Plan: I injected these areas today with 10 mg of Kenalog 5 mg and Marcaine tolerated procedure well without complications.  Would like to follow-up with her as needed if this fails to alleviate her symptoms may be necessary for comparative MRI from last year.

## 2023-01-29 DIAGNOSIS — F331 Major depressive disorder, recurrent, moderate: Secondary | ICD-10-CM | POA: Diagnosis not present

## 2023-02-05 DIAGNOSIS — F331 Major depressive disorder, recurrent, moderate: Secondary | ICD-10-CM | POA: Diagnosis not present

## 2023-02-06 DIAGNOSIS — Z9884 Bariatric surgery status: Secondary | ICD-10-CM | POA: Diagnosis not present

## 2023-02-06 DIAGNOSIS — R159 Full incontinence of feces: Secondary | ICD-10-CM | POA: Diagnosis not present

## 2023-02-06 DIAGNOSIS — Z9049 Acquired absence of other specified parts of digestive tract: Secondary | ICD-10-CM | POA: Diagnosis not present

## 2023-02-06 DIAGNOSIS — R32 Unspecified urinary incontinence: Secondary | ICD-10-CM | POA: Diagnosis not present

## 2023-02-06 DIAGNOSIS — R768 Other specified abnormal immunological findings in serum: Secondary | ICD-10-CM | POA: Diagnosis not present

## 2023-11-03 ENCOUNTER — Encounter: Payer: Self-pay | Admitting: Hematology

## 2023-11-27 ENCOUNTER — Ambulatory Visit: Admitting: Podiatry

## 2023-11-27 ENCOUNTER — Other Ambulatory Visit: Payer: Self-pay | Admitting: Orthopaedic Surgery

## 2023-11-27 DIAGNOSIS — M25572 Pain in left ankle and joints of left foot: Secondary | ICD-10-CM

## 2023-12-03 ENCOUNTER — Encounter: Payer: Self-pay | Admitting: Hematology

## 2023-12-08 ENCOUNTER — Encounter: Payer: Self-pay | Admitting: Hematology

## 2023-12-15 ENCOUNTER — Inpatient Hospital Stay
Admission: RE | Admit: 2023-12-15 | Discharge: 2023-12-15 | Disposition: A | Discharge status: Home / Self Care | Source: Ambulatory Visit | Attending: Orthopaedic Surgery

## 2023-12-15 DIAGNOSIS — M25572 Pain in left ankle and joints of left foot: Secondary | ICD-10-CM
